# Patient Record
Sex: Male | Born: 1975
Health system: Southern US, Community
[De-identification: ages and names within clinical notes are randomized; demographics above are authoritative.]

## PROBLEM LIST (undated history)

## (undated) DIAGNOSIS — M549 Dorsalgia, unspecified: Secondary | ICD-10-CM

## (undated) DIAGNOSIS — F209 Schizophrenia, unspecified: Secondary | ICD-10-CM

## (undated) DIAGNOSIS — F319 Bipolar disorder, unspecified: Secondary | ICD-10-CM

## (undated) DIAGNOSIS — I10 Essential (primary) hypertension: Secondary | ICD-10-CM

## (undated) DIAGNOSIS — G8929 Other chronic pain: Secondary | ICD-10-CM

## (undated) HISTORY — DX: Essential (primary) hypertension: I10

## (undated) HISTORY — DX: Bipolar disorder, unspecified: F31.9

## (undated) HISTORY — DX: Other chronic pain: G89.29

## (undated) HISTORY — DX: Dorsalgia, unspecified: M54.9

---

## 2001-03-12 ENCOUNTER — Emergency Department (HOSPITAL_COMMUNITY): Admission: EM | Admit: 2001-03-12 | Discharge: 2001-03-12 | Payer: Self-pay | Admitting: *Deleted

## 2003-03-31 ENCOUNTER — Emergency Department (HOSPITAL_COMMUNITY): Admission: AD | Admit: 2003-03-31 | Discharge: 2003-03-31 | Payer: Self-pay | Admitting: Emergency Medicine

## 2005-05-04 ENCOUNTER — Emergency Department (HOSPITAL_COMMUNITY): Admission: EM | Admit: 2005-05-04 | Discharge: 2005-05-04 | Payer: Self-pay | Admitting: Family Medicine

## 2015-03-19 ENCOUNTER — Encounter (HOSPITAL_COMMUNITY): Payer: Self-pay

## 2015-03-19 ENCOUNTER — Emergency Department (HOSPITAL_COMMUNITY)
Admission: EM | Admit: 2015-03-19 | Discharge: 2015-03-20 | Disposition: A | Discharge status: Home / Self Care | Payer: Self-pay | Attending: Emergency Medicine | Admitting: Emergency Medicine

## 2015-03-19 DIAGNOSIS — Y998 Other external cause status: Secondary | ICD-10-CM | POA: Insufficient documentation

## 2015-03-19 DIAGNOSIS — Z23 Encounter for immunization: Secondary | ICD-10-CM | POA: Insufficient documentation

## 2015-03-19 DIAGNOSIS — Z720 Tobacco use: Secondary | ICD-10-CM | POA: Insufficient documentation

## 2015-03-19 DIAGNOSIS — T07XXXA Unspecified multiple injuries, initial encounter: Secondary | ICD-10-CM

## 2015-03-19 DIAGNOSIS — S0083XA Contusion of other part of head, initial encounter: Secondary | ICD-10-CM | POA: Insufficient documentation

## 2015-03-19 DIAGNOSIS — Y9289 Other specified places as the place of occurrence of the external cause: Secondary | ICD-10-CM | POA: Insufficient documentation

## 2015-03-19 DIAGNOSIS — S8992XA Unspecified injury of left lower leg, initial encounter: Secondary | ICD-10-CM | POA: Insufficient documentation

## 2015-03-19 DIAGNOSIS — Y9389 Activity, other specified: Secondary | ICD-10-CM | POA: Insufficient documentation

## 2015-03-19 DIAGNOSIS — Z8659 Personal history of other mental and behavioral disorders: Secondary | ICD-10-CM | POA: Insufficient documentation

## 2015-03-19 DIAGNOSIS — S60512A Abrasion of left hand, initial encounter: Secondary | ICD-10-CM | POA: Insufficient documentation

## 2015-03-19 HISTORY — DX: Schizophrenia, unspecified: F20.9
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
## 2015-03-20 MED ORDER — ACETAMINOPHEN 325 MG PO TABS
650.0000 mg | ORAL_TABLET | Freq: Once | ORAL | Status: AC
  Administered 2015-03-20: 650 mg via ORAL
  Filled 2015-03-20: qty 2

## 2015-03-20 MED ORDER — IBUPROFEN 800 MG PO TABS: 800.0000 mg | ORAL_TABLET | Freq: Three times a day (TID) | ORAL | Status: DC

## 2015-03-20 MED ORDER — BACITRACIN ZINC 500 UNIT/GM EX OINT
TOPICAL_OINTMENT | CUTANEOUS | Status: AC
  Administered 2015-03-20: 1
  Filled 2015-03-20: qty 0.9

## 2015-03-20 MED ORDER — TETANUS-DIPHTH-ACELL PERTUSSIS 5-2.5-18.5 LF-MCG/0.5 IM SUSP
0.5000 mL | Freq: Once | INTRAMUSCULAR | Status: AC
  Administered 2015-03-20: 0.5 mL via INTRAMUSCULAR
  Filled 2015-03-20: qty 0.5

## 2015-03-20 NOTE — ED Notes (Signed)
Pt L hand cleaned w/ NS, bacitracin placed on wound, wrapped w/ gauze.
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
## 2015-12-28 DIAGNOSIS — I10 Essential (primary) hypertension: Secondary | ICD-10-CM

## 2015-12-28 HISTORY — DX: Essential (primary) hypertension: I10

## 2015-12-31 ENCOUNTER — Encounter (HOSPITAL_COMMUNITY): Payer: Self-pay | Admitting: Emergency Medicine

## 2015-12-31 ENCOUNTER — Emergency Department (INDEPENDENT_AMBULATORY_CARE_PROVIDER_SITE_OTHER): Payer: Self-pay

## 2015-12-31 ENCOUNTER — Emergency Department (INDEPENDENT_AMBULATORY_CARE_PROVIDER_SITE_OTHER)
Admission: EM | Admit: 2015-12-31 | Discharge: 2015-12-31 | Disposition: A | Discharge status: Home / Self Care | Payer: Self-pay | Source: Home / Self Care | Attending: Family Medicine | Admitting: Family Medicine

## 2015-12-31 DIAGNOSIS — M542 Cervicalgia: Secondary | ICD-10-CM
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
## 2017-11-22 ENCOUNTER — Ambulatory Visit: Payer: Self-pay

## 2017-11-22 ENCOUNTER — Other Ambulatory Visit: Payer: Self-pay | Admitting: Occupational Medicine

## 2017-11-22 DIAGNOSIS — M545 Low back pain: Principal | ICD-10-CM

## 2017-11-22 DIAGNOSIS — G8929 Other chronic pain: Secondary | ICD-10-CM

## 2018-10-27 HISTORY — PX: NO PAST SURGERIES: SHX2092

## 2018-11-02 ENCOUNTER — Encounter: Payer: Self-pay | Admitting: Medical

## 2018-11-02 ENCOUNTER — Ambulatory Visit: Payer: BLUE CROSS/BLUE SHIELD | Admitting: Medical

## 2018-11-02 VITALS — BP 170/100 | HR 107 | Temp 98.3°F | Resp 16 | Ht 72.0 in | Wt 208.6 lb

## 2018-11-02 DIAGNOSIS — R609 Edema, unspecified: Secondary | ICD-10-CM | POA: Insufficient documentation

## 2018-11-02 DIAGNOSIS — R319 Hematuria, unspecified: Secondary | ICD-10-CM

## 2018-11-02 DIAGNOSIS — F319 Bipolar disorder, unspecified: Secondary | ICD-10-CM | POA: Diagnosis not present

## 2018-11-02 DIAGNOSIS — F17200 Nicotine dependence, unspecified, uncomplicated: Secondary | ICD-10-CM | POA: Diagnosis not present

## 2018-11-02 DIAGNOSIS — I10 Essential (primary) hypertension: Secondary | ICD-10-CM

## 2018-11-02 LAB — POCT URINALYSIS DIP (PROADVANTAGE DEVICE)
Bilirubin, UA: NEGATIVE
Glucose, UA: >=1000 mg/dL — AB
Ketones, POC UA: NEGATIVE mg/dL
Leukocytes, UA: NEGATIVE
Nitrite, UA: NEGATIVE
Protein Ur, POC: 100 mg/dL — AB
Specific Gravity, Urine: 1.015
Urobilinogen, Ur: NEGATIVE
pH, UA: 6 (ref 5.0–8.0)

## 2018-11-02 MED ORDER — AZILSARTAN-CHLORTHALIDONE 40-12.5 MG PO TABS
1.0000 | ORAL_TABLET | Freq: Every day | ORAL | 1 refills | Status: DC
Start: 2018-11-02 — End: 2020-01-23

## 2018-11-02 NOTE — Patient Instructions (Addendum)
It was nice to meet you today  You have uncontrolled high blood pressure, and unfortunately your EKG heart screen looks as if you may have had some damage to the heart   I would like to refer you to a heart doctor soon for baseline evaluation  Recommendation  I would like to begin you on a medication called Edarbychlor 40/12.5 mg 1 tablet daily in the morning to control high blood pressure  Cut back on salt intake such as soda and do not add salt to your food  Try to change your eating habits to eat more fruits and vegetables and less fast food and eating out  And though stopping smoking is hard, you need to cut back and try to stop tobacco  Do some walking daily for exercise  I would like to see you back in a month, but in the meantime we will have you see the cardiologist     Hypertension, commonly called high blood pressure, is when the force of blood pumping through your arteries is too strong. Your arteries are the blood vessels that carry blood from your heart throughout your body. A blood pressure reading consists of a higher number over a lower number, such as 110/72. The higher number (systolic) is the pressure inside your arteries when your heart pumps. The lower number (diastolic) is the pressure inside your arteries when your heart relaxes. Ideally you want your blood pressure below 120/80. Hypertension forces your heart to work harder to pump blood. Your arteries may become narrow or stiff. Having hypertension puts you at risk for heart disease, stroke, and other problems.  RISK FACTORS Some risk factors for high blood pressure are controllable. Others are not.  Risk factors you cannot control include:   Race. You may be at higher risk if you are African American.  Age. Risk increases with age.  Gender. Men are at higher risk than women before age 64 years. After age 90, women are at higher risk than men. Risk factors you can control include:  Not getting enough  exercise or physical activity.  Being overweight.  Getting too much fat, sugar, calories, or salt in your diet.  Drinking too much alcohol. SIGNS AND SYMPTOMS Hypertension does not usually cause signs or symptoms. Extremely high blood pressure (hypertensive crisis) may cause headache, anxiety, shortness of breath, and nosebleed. DIAGNOSIS  To check if you have hypertension, your health care provider will measure your blood pressure while you are seated, with your arm held at the level of your heart. It should be measured at least twice using the same arm. Certain conditions can cause a difference in blood pressure between your right and left arms. A blood pressure reading that is higher than normal on one occasion does not mean that you need treatment. If one blood pressure reading is high, ask your health care provider about having it checked again. BLOOD PRESSURE STAGES Blood pressure is classified into four stages: normal, prehypertension, stage 1, and stage 2. Your blood pressure reading will be used to determine what type of treatment, if any, is necessary. Appropriate treatment options are tied to these four stages:  Normal  Systolic pressure (mm Hg): below 120.  Diastolic pressure (mm Hg): below 80. Prehypertension  Systolic pressure (mm Hg): 120 to 139.  Diastolic pressure (mm Hg): 80 to 89. Stage1  Systolic pressure (mm Hg): 140 to 159.  Diastolic pressure (mm Hg): 90 to 99. Stage2  Systolic pressure (mm Hg): 160 or above.  Diastolic pressure (mm Hg): 100 or above. RISKS RELATED TO HIGH BLOOD PRESSURE Managing your blood pressure is an important responsibility. Uncontrolled high blood pressure can lead to:  A heart attack.  A stroke.  A weakened blood vessel (aneurysm).  Heart failure.  Kidney damage.  Eye damage.  Metabolic syndrome.  Memory and concentration problems. TREATMENT  Treating high blood pressure includes making lifestyle changes and  possibly taking medicine. Living a healthy lifestyle can help lower high blood pressure. You may need to change some of your habits. Lifestyle changes may include:  Following the DASH diet. This diet is high in fruits, vegetables, and whole grains. It is low in salt, red meat, and added sugars.  Getting at least 2 hours of brisk physical activity every week.  Losing weight if necessary.  Not smoking.  Limiting alcoholic beverages.  Learning ways to reduce stress. If lifestyle changes are not enough to get your blood pressure under control, your health care provider may prescribe medicine. You may need to take more than one. Work closely with your health care provider to understand the risks and benefits. HOME CARE INSTRUCTIONS  Have your blood pressure rechecked as directed by your health care provider.   Take medicines only as directed by your health care provider. Follow the directions carefully. Blood pressure medicines must be taken as prescribed. The medicine does not work as well when you skip doses. Skipping doses also puts you at risk for problems.   Do not smoke.   Monitor your blood pressure at home as directed by your health care provider. SEEK MEDICAL CARE IF:   You think you are having a reaction to medicines taken.  You have recurrent headaches or feel dizzy.  You have swelling in your ankles.  You have trouble with your vision. SEEK IMMEDIATE MEDICAL CARE IF:  You develop a severe headache or confusion.  You have unusual weakness, numbness, or feel faint.  You have severe chest or abdominal pain.  You vomit repeatedly.  You have trouble breathing. MAKE SURE YOU:   Understand these instructions.  Will watch your condition.  Will get help right away if you are not doing well or get worse. Document Released: 12/13/2005 Document Revised: 04/29/2014 Document Reviewed: 10/05/2013 [facility] Patient Information 2015 [place], [place]. This information is  not intended to replace advice given to you by your health care provider. Make sure you discuss any questions you have with your health care provider.

## 2018-11-02 NOTE — Progress Notes (Signed)
Subjective:  Rodney Rose is a 42 y.o. male who presents for Chief Complaint  Patient presents with  . NP    NP elevated BP X 1-2 year     Medical team: Dr. Georgia Lopes at [facility] Psychiatry x 10 years  Here is a new patient.   Here for consult about high blood pressure.  Sees psychiatry, and BP is checked there.  He knows he has had untreated high blood pressure x 2 years.   No chest pains.  No palpitations, no SOB, but does get some mild swelling in legs.  He is a smoker, eats unhealthy, fast food, soda, salt regularly.   Uses NSAIDs some.   exercise is limited.  On workers comp coverage currently, has back pain.    No other aggravating or relieving factors.    No other c/o.  The following portions of the patient's history were reviewed and updated as appropriate: allergies, current medications, past family history, past medical history, past social history, past surgical history and problem list.  ROS Otherwise as in subjective above  Past Medical History:  Diagnosis Date  . Bipolar disorder (HCC)    Dr. Georgia Lopes with [facility] Psychiatry  . Chronic back pain   . Hypertension 2017  . Schizophrenia (HCC)    Current Outpatient Medications on File Prior to Visit  Medication Sig Dispense Refill  . haloperidol (HALDOL) 5 MG tablet Take 5 mg by mouth at bedtime.    [name] ibuprofen (ADVIL,MOTRIN) 800 MG tablet Take 1 tablet (800 mg total) by mouth 3 (three) times daily. 21 tablet 0   No current facility-administered medications on file prior to visit.      Objective: BP (!) 170/100   Pulse (!) 107   Temp 98.3 F (36.8 C) (Oral)   Resp 16   Ht 6' (1.829 m)   Wt 208 lb 9.6 oz (94.6 kg)   SpO2 98%   BMI 28.29 kg/m   General appearance: alert, no distress, well developed, well nourished Neck: supple, no lymphadenopathy, no thyromegaly, no masses, no bruits Heart: RRR, normal S1, S2, no murmurs Lungs: CTA bilaterally, no wheezes, rhonchi, or rales Abdomen: +bs, soft, non tender,  non distended, no masses, no hepatomegaly, no splenomegaly, no bruits Pulses: 2+ radial pulses, 2+ pedal pulses, normal cap refill Ext: no edema   Adult ECG Report  Indication: HTN  Rate: 101 bpm  Rhythm: sinus tachycardia  QRS Axis: -19 degrees  PR Interval:  QRS Duration: 94ms  QTc:  Conduction Disturbances: none  Other Abnormalities: ST elevation v2, likely v3  Patient's cardiac risk factors are: hypertension, male gender and smoking/ tobacco exposure.  EKG comparison: none  Narrative Interpretation: abnormal EKG    Assessment: Encounter Diagnoses  Name Primary?  . Essential hypertension, benign Yes  . Smoker   . Bipolar affective disorder, remission status unspecified (HCC)   . Edema, unspecified type      Plan: We discussed his blood pressure and abnormal EKG today.  Begin samples of Edarbychlor, referral to cardiology, counseled on diet, exercise, medication and follow-up.  Discussed symptoms that would prompt a Call 911 if he started having cardiac symptoms  Reviewed the following with patient: It was nice to meet you today  You have uncontrolled high blood pressure, and unfortunately your EKG heart screen looks as if you may have had some damage to the heart   I would like to refer you to a heart doctor soon for baseline evaluation  Recommendation  I would like to begin you on a medication called Edarbychlor 40/12.5 mg 1 tablet daily in the morning to control high blood pressure  Cut back on salt intake such as soda and do not add salt to your food  Try to change your eating habits to eat more fruits and vegetables and less fast food and eating out  And though stopping smoking is hard, you need to cut back and try to stop tobacco  Do some walking daily for exercise  I would like to see you back in a month, but in the meantime we will have you see the cardiologist  [name] was seen today for np.  Diagnoses and all orders for this  visit:  Essential hypertension, benign -     EKG 12-Lead -     Comprehensive metabolic panel -     CBC -     Lipid panel -     TSH -     Ambulatory referral to Cardiology  Smoker  Bipolar affective disorder, remission status unspecified (HCC)  Edema, unspecified type -     Comprehensive metabolic panel  Other orders -     Azilsartan-Chlorthalidone 40-12.5 MG TABS; Take 1 tablet by mouth daily.    Follow up: pending labs

## 2018-11-03 ENCOUNTER — Ambulatory Visit: Payer: BLUE CROSS/BLUE SHIELD | Admitting: Medical

## 2018-11-03 VITALS — BP 180/110 | HR 110 | Temp 98.1°F | Resp 16 | Ht 72.0 in | Wt 208.0 lb

## 2018-11-03 DIAGNOSIS — E119 Type 2 diabetes mellitus without complications: Secondary | ICD-10-CM

## 2018-11-03 DIAGNOSIS — E1365 Other specified diabetes mellitus with hyperglycemia: Secondary | ICD-10-CM | POA: Diagnosis not present

## 2018-11-03 DIAGNOSIS — E782 Mixed hyperlipidemia: Secondary | ICD-10-CM

## 2018-11-03 DIAGNOSIS — F17200 Nicotine dependence, unspecified, uncomplicated: Secondary | ICD-10-CM

## 2018-11-03 DIAGNOSIS — R739 Hyperglycemia, unspecified: Secondary | ICD-10-CM | POA: Diagnosis not present

## 2018-11-03 DIAGNOSIS — R9431 Abnormal electrocardiogram [ECG] [EKG]: Secondary | ICD-10-CM

## 2018-11-03 DIAGNOSIS — I10 Essential (primary) hypertension: Secondary | ICD-10-CM | POA: Diagnosis not present

## 2018-11-03 DIAGNOSIS — IMO0002 Reserved for concepts with insufficient information to code with codable children: Secondary | ICD-10-CM | POA: Insufficient documentation

## 2018-11-03 DIAGNOSIS — E1165 Type 2 diabetes mellitus with hyperglycemia: Secondary | ICD-10-CM | POA: Insufficient documentation

## 2018-11-03 LAB — TSH: TSH: 0.752 u[IU]/mL (ref 0.450–4.500)

## 2018-11-03 LAB — LIPID PANEL
Chol/HDL Ratio: 8.8 ratio — ABNORMAL HIGH (ref 0.0–5.0)
Cholesterol, Total: 245 mg/dL — ABNORMAL HIGH (ref 100–199)
HDL: 28 mg/dL — ABNORMAL LOW (ref >39)
LDL Calculated: 155 mg/dL — ABNORMAL HIGH (ref 0–99)
Triglycerides: 310 mg/dL — ABNORMAL HIGH (ref 0–149)
VLDL Cholesterol Cal: 62 mg/dL — ABNORMAL HIGH (ref 5–40)

## 2018-11-03 LAB — COMPREHENSIVE METABOLIC PANEL
ALT: 14 IU/L (ref 0–44)
AST: 13 IU/L (ref 0–40)
Albumin/Globulin Ratio: 1.7 (ref 1.2–2.2)
Albumin: 3.5 g/dL (ref 3.5–5.5)
Alkaline Phosphatase: 86 IU/L (ref 39–117)
BUN/Creatinine Ratio: 7 — ABNORMAL LOW (ref 9–20)
BUN: 8 mg/dL (ref 6–24)
Bilirubin Total: 0.3 mg/dL (ref 0.0–1.2)
CO2: 25 mmol/L (ref 20–29)
Calcium: 8.8 mg/dL (ref 8.7–10.2)
Chloride: 97 mmol/L (ref 96–106)
Creatinine, Ser: 1.22 mg/dL (ref 0.76–1.27)
GFR calc Af Amer: 85 mL/min/{1.73_m2} (ref >59)
GFR calc non Af Amer: 74 mL/min/{1.73_m2} (ref >59)
Globulin, Total: 2.1 g/dL (ref 1.5–4.5)
Glucose: 485 mg/dL — ABNORMAL HIGH (ref 65–99)
Potassium: 3.3 mmol/L — ABNORMAL LOW (ref 3.5–5.2)
Sodium: 137 mmol/L (ref 134–144)
Total Protein: 5.6 g/dL — ABNORMAL LOW (ref 6.0–8.5)

## 2018-11-03 LAB — CBC
Hematocrit: 45.8 % (ref 37.5–51.0)
Hemoglobin: 14.7 g/dL (ref 13.0–17.7)
MCH: 25.5 pg — ABNORMAL LOW (ref 26.6–33.0)
MCHC: 32.1 g/dL (ref 31.5–35.7)
MCV: 80 fL (ref 79–97)
Platelets: 226 10*3/uL (ref 150–450)
RBC: 5.76 x10E6/uL (ref 4.14–5.80)
RDW: 12.5 % (ref 12.3–15.4)
WBC: 9.3 10*3/uL (ref 3.4–10.8)

## 2018-11-03 LAB — POCT CBG (FASTING - GLUCOSE)-MANUAL ENTRY: Glucose Fasting, POC: 336 mg/dL — AB (ref 70–99)

## 2018-11-03 LAB — POCT GLYCOSYLATED HEMOGLOBIN (HGB A1C): Hemoglobin A1C: 11.1 % — AB (ref 4.0–5.6)

## 2018-11-03 MED ORDER — INSULIN PEN NEEDLE 32G X 4 MM MISC: 1.0000 | Freq: Every day | 11 refills | Status: DC

## 2018-11-03 MED ORDER — INSULIN GLULISINE 100 UNIT/ML SOLOSTAR PEN: 10.0000 [IU] | PEN_INJECTOR | Freq: Three times a day (TID) | SUBCUTANEOUS | 2 refills | Status: DC

## 2018-11-03 NOTE — Patient Instructions (Addendum)
I hate to give you bad news 2 days in a row here, but sometimes we just need to address what we have at hand  Your current findings yesterday and today show uncontrolled high blood pressure, new diagnosis of uncontrolled diabetes, high cholesterol, and abnormal EKG heart marker  Per yesterday's discussion we are going to refer you to a heart doctor for further evaluation  Continue the samples of Edarbychlor blood pressure pill once daily in the morning we started yesterday   Regarding this new diagnosis of diabetes, I need to start you on mealtime insulin given the fact that the blood sugars are so high currently  Let us begin Apidra mealtime insulin with a sliding scale below.  Take this insulin less than 15 minutes before or less than 20 minutes after a meal.  I do need you to check your blood sugars before meals and write them down in the logbook we gave you today so we can keep track of your blood sugars for right now  I put diet recommendations below at the bottom    Correction Insulin/Sliding Scale Your caregiver has decided you need insulin at home. You have been given a correctional scale (sliding scale) in case you need extra insulin when your blood sugar is too high (hyperglycemia). The following instructions will assist you in how to use that correctional scale.  WHAT IS A CORRECTIONAL SCALE (SLIDING SCALE)?  When you check your blood sugar, sometimes it will be higher than your caregiver wants it to be. You may need an extra dose of insulin to bring your blood sugar to your desired level (also known as your goal, target level, or normal level.) The correctional scale is prescribed by your caregiver based on your specific needs.   ______________________________________________________________________  INSULIN SLIDING SCALE   Use the chart below to determine the amount of your APIDRA Insulin that you will use to control your meal time blood sugar.  If your glucose before  meal is less than 60, drink 4 oz of orange juice or if able, eat a piece of candy and do not use the meal time dose of insulin  If your glucose before meal is 60 -100, don't use the meal time insulin for this meal If your glucose before meal is 101-150, use  3  units of Insulin  If your glucose before meal is 151-200, use  5  units of Insulin  If your glucose before meal is 201-250, use  7  units of Insulin If your glucose before meal is 251-300, use  9  units of Insulin If your glucose before meal is 301-350, use  11  units of Insulin If your glucose before meal is 351-400, use  13  units of Insulin If your glucose before meal is 451-500, use  15  units of Insulin If your glucose before meal is >500, use 17 units of Insulin and call doctor immediately  ________________________________________________________________________    WHY IS IT IMPORTANT TO KEEP YOUR BLOOD SUGAR LEVELS AT YOUR DESIRED LEVEL?  It helps to prevent long-term complications of diabetes, such as eye disease, kidney failure, and other serious complications. WHAT TYPE OF INSULIN WILL YOU USE?  To help bring down blood sugars that are too high, your caregiver has prescribed a short-acting or a rapid-acting insulin. An example of a short-acting insulin would be Regular.  WHAT DO I NEED TO DO?   Check your blood sugar with your home blood glucose meter as recommended by your  caregiver.  Using your correctional scale, find the range your blood sugar lies in.  Look for the units of insulin that matches the blood sugar range. Give yourself the dose of correctional insulin your caregiver has prescribed. Always make sure you are using the right type of insulin.  Prior to the injection make sure you have food available that you can eat in the next 15 to 30 minutes.  If your correctional insulin is rapid acting, start eating your meal within 15 minutes after you have given yourself the insulin injection. If you wait  longer than 15 minutes to eat, your blood sugar might get too low.  If your correctional insulin is short acting (Regular), start eating your meal within 30 minutes after you have given yourself the insulin injection. If you wait longer than 30 minutes to eat, your blood sugar might get too low. Symptoms of low blood sugar (hypoglycemia) may include feeling shaky or weak, sweating a lot, not thinking straight, difficulty seeing, agitation, or crankiness. Check your blood sugar immediately and treat your results as directed by your caregiver.  Keep a log of your blood sugar results with the time you took the test and the amount of insulin that you injected. This information will help your caregiver manage your medications.  Note on your log anything that may affect your blood sugars such as:  Changes in normal exercise or activity.  Changes in your normal schedule, such as staying up late, going on vacation, changing your diet, or holidays.  New medications. This includes all medications. Some medications, even those that do not require a prescription, may cause high blood sugars.  Illness or stress.  Changes in when you actually took your medication.  Changes in your meals, such as skipping a meal, a late meal, or dining out.  Eating things that may affect blood glucose, such as snacks, larger meal portions than normal, or drinks with sugar.  Ask your caregiver any questions you have.      I want you to eat 3 meals a day +2 snacks, one midmorning snack and one mid afternoon snack  Breakfast You may eat 1 of the following  Omelette, which can include a small amount of cheese, and vegetables such as peppers, mushrooms, small pieces of [place] or chicken  Low sugar yogurt serving which can include some fruit such as berries  Egg whites or hard boiled egg and meat (1-2 strips of bacon, or small piece of [place] sausage or [place] bacon)   Mid-morning snack 1 fruit serving such as  one of the following:  medium-sized apple  medium-sized orange,  Tangerine  1/2 banana   3/4 cup of fresh berries or frozen berries  A protein source such as one of the following:  8 almonds   small handful of walnuts or other nuts  small piece of cheese,  low sugar yogurt   Lunch A protein source such as 1 of the following: . 1 serving of beans such as black beans, pinto beans, green beans, or edamame (soy beans) . 1 meat serving such as 6 oz or deck of card size serving of fish, skinless chicken, or [place], either grilled or baked preferably.   You can use some pork or beef, but limit this compared to fish, chicken or [place] Vegetable - Half of your plate should be a non-starchy vegetables!  So avoid white potatoes and corn.  Otherwise, eat a large portion of vegetables. . Avocado, cucumber, tomato, carrots, greens,  lettuce, squash, okra, etc.  . Vegetables can include salad with olive oil/vinaigrette dressing   Mid-afternoon snack 1 fruit serving such as one of the following:  medium-sized apple  medium-sized orange,  Tangerine  1/2 banana   3/4 cup of fresh berries or frozen berries  A protein source such as one of the following:  8 almonds   small handful of walnuts or other nuts  small piece of cheese,  low sugar yogurt   Dinner A protein source such as 1 of the following: . 1 serving of beans such as black beans, pinto beans, green beans, or edamame (soy beans) . 1 meat serving such as 6 oz or deck of card size serving of fish, skinless chicken, or [place], either grilled or baked preferably.   You can use some pork or beef, but limit this compared to fish, chicken or [place] Vegetable - Half of your plate should be a non-starchy vegetables!  So avoid white potatoes and corn.  Otherwise, eat a large portion of vegetables. . Avocado, cucumber, tomato, carrots, greens, lettuce, squash, okra, etc.  . Vegetables can include salad with olive  oil/vinaigrette dressing   Beverages: Water Unsweet tea Home made juice with a juicer without sugar added other than small bit of honey or agave nectar Water with sugar free flavor such as [place]   AVOID.... For the time being I want you to cut out the following items completely: . Soda, sweet tea, juice, beer or wine or alcohol . ALL grains and breads including rice, pasta, bread, cereal . Sweets such as cake, candy, pies, chips, cookies, chocolate

## 2018-11-03 NOTE — Progress Notes (Signed)
Subjective:  Rodney Rose is a 42 y.o. male who presents for Chief Complaint  Patient presents with  . follow up    follow up Sugar high     Here for 1 day follow-up.  I saw him yesterday as a new patient for high blood pressure, but he had sugar in the urine yesterday.  His blood sugar came back over 400 so we called him to get him back in again today.  He has no history of diabetes.  Medical team: Dr. Georgia Rose at [facility] Psychiatry x 10 years  He denies polyuria, polydipsia, weight changes recently or blurred vision  No other aggravating or relieving factors.    No other c/o.  The following portions of the patient's history were reviewed and updated as appropriate: allergies, current medications, past family history, past medical history, past social history, past surgical history and problem list.  ROS Otherwise as in subjective above  Past Medical History:  Diagnosis Date  . Bipolar disorder (HCC)    Dr. Georgia Rose with [facility] Psychiatry  . Chronic back pain   . Hypertension 2017  . Schizophrenia [facility])    Current Outpatient Medications on File Prior to Visit  Medication Sig Dispense Refill  . Azilsartan-Chlorthalidone 40-12.5 MG TABS Take 1 tablet by mouth daily. 30 tablet 1  . haloperidol (HALDOL) 5 MG tablet Take 5 mg by mouth at bedtime.    [name] ibuprofen (ADVIL,MOTRIN) 800 MG tablet Take 1 tablet (800 mg total) by mouth 3 (three) times daily. 21 tablet 0   No current facility-administered medications on file prior to visit.      Objective: BP (!) 180/110   Pulse (!) 110   Temp 98.1 F (36.7 C) (Oral)   Resp 16   Ht 6' (1.829 m)   Wt 208 lb (94.3 kg)   SpO2 98%   BMI 28.21 kg/m   General appearance: alert, no distress, well developed, well nourished Neck: supple, no lymphadenopathy, no thyromegaly, no masses, no bruits Heart: RRR, normal S1, S2, no murmurs Lungs: CTA bilaterally, no wheezes, rhonchi, or rales Abdomen: +bs, soft, non tender, non distended,  no masses, no hepatomegaly, no splenomegaly, no bruits Pulses: 2+ radial pulses, 2+ pedal pulses, normal cap refill Ext: no edema    Assessment: Encounter Diagnoses  Name Primary?  [name] Uncontrolled other specified diabetes mellitus with hyperglycemia (HCC)   . Elevated blood sugar Yes  . Diabetes mellitus, new onset (HCC)   . Essential hypertension, benign   . Mixed dyslipidemia   . Abnormal EKG   . Smoker      Plan: I reviewed his lab results with him from yesterday, we did fingerstick blood sugar and hemoglobin A1c today. Hemoglobin A1c over 11%, blood sugar random was 336, last meal about 2 hours ago.  I spent over 40 minutes discussing new diagnosis of diabetes, the severity of his current blood sugar levels and possible complications, discussed treatment, short-term goals.  I had nurse come in and review glucometer testing, logbook, how to use insulin, and we had him do this first 8 units in the office.  We discussed sliding scale that I want him to use and check sugars before meals.  We had a sample of Apidra today and I did want to risk him going to the weekend without mealtime insulin so we will start with this.  Give the following recommendations in writing:  Your current findings yesterday and today show uncontrolled high blood pressure, new diagnosis of uncontrolled diabetes,  high cholesterol, and abnormal EKG heart marker  Per yesterday's discussion we are going to refer you to a heart doctor for further evaluation  Continue the samples of Edarbychlor blood pressure pill once daily in the morning we started yesterday   Regarding this new diagnosis of diabetes, I need to start you on mealtime insulin given the fact that the blood sugars are so high currently  Let us begin Apidra mealtime insulin with a sliding scale below.  Take this insulin less than 15 minutes before or less than 20 minutes after a meal.  I do need you to check your blood sugars before meals and  write them down in the logbook we gave you today so we can keep track of your blood sugars for right now  I put diet recommendations below at the bottom    Correction Insulin/Sliding Scale Your caregiver has decided you need insulin at home. You have been given a correctional scale (sliding scale) in case you need extra insulin when your blood sugar is too high (hyperglycemia). The following instructions will assist you in how to use that correctional scale.  WHAT IS A CORRECTIONAL SCALE (SLIDING SCALE)?  When you check your blood sugar, sometimes it will be higher than your caregiver wants it to be. You may need an extra dose of insulin to bring your blood sugar to your desired level (also known as your goal, target level, or normal level.) The correctional scale is prescribed by your caregiver based on your specific needs.   ______________________________________________________________________  INSULIN SLIDING SCALE   Use the chart below to determine the amount of your APIDRA Insulin that you will use to control your meal time blood sugar.  If your glucose before meal is less than 60, drink 4 oz of orange juice or if able, eat a piece of candy and do not use the meal time dose of insulin  If your glucose before meal is 60 -100, don't use the meal time insulin for this meal If your glucose before meal is 101-150, use  3  units of Insulin  If your glucose before meal is 151-200, use  5  units of Insulin  If your glucose before meal is 201-250, use  7  units of Insulin If your glucose before meal is 251-300, use  9  units of Insulin If your glucose before meal is 301-350, use  11  units of Insulin If your glucose before meal is 351-400, use  13  units of Insulin If your glucose before meal is 451-500, use  15  units of Insulin If your glucose before meal is >500, use 17 units of Insulin and call doctor immediately  Rodney Rose was seen today for follow up.  Diagnoses and all orders  for this visit:  Elevated blood sugar -     Glucose (CBG), Fasting -     HgB A1c  Uncontrolled other specified diabetes mellitus with hyperglycemia (HCC)  Diabetes mellitus, new onset (HCC)  Essential hypertension, benign  Mixed dyslipidemia  Abnormal EKG  Smoker  Other orders -     Insulin Glulisine (APIDRA SOLOSTAR) 100 UNIT/ML Solostar Pen; Inject 10 Units into the skin 3 (three) times daily. -     Insulin Pen Needle (BD PEN NEEDLE NANO U/F) 32G X 4 MM MISC; 1 each by Does not apply route at bedtime.    Follow up: 1-2 weeks

## 2018-11-06 DIAGNOSIS — F17200 Nicotine dependence, unspecified, uncomplicated: Secondary | ICD-10-CM | POA: Diagnosis not present

## 2018-11-06 DIAGNOSIS — I10 Essential (primary) hypertension: Secondary | ICD-10-CM | POA: Diagnosis not present

## 2018-11-06 DIAGNOSIS — R9431 Abnormal electrocardiogram [ECG] [EKG]: Secondary | ICD-10-CM | POA: Diagnosis not present

## 2018-11-06 DIAGNOSIS — E782 Mixed hyperlipidemia: Secondary | ICD-10-CM | POA: Diagnosis not present

## 2018-11-14 DIAGNOSIS — I10 Essential (primary) hypertension: Secondary | ICD-10-CM | POA: Diagnosis not present

## 2018-11-14 DIAGNOSIS — R9431 Abnormal electrocardiogram [ECG] [EKG]: Secondary | ICD-10-CM | POA: Diagnosis not present

## 2018-11-15 ENCOUNTER — Ambulatory Visit (HOSPITAL_COMMUNITY)
Admission: RE | Admit: 2018-11-15 | Discharge: 2018-11-15 | Disposition: A | Discharge status: Home / Self Care | Payer: BLUE CROSS/BLUE SHIELD | Source: Ambulatory Visit | Attending: Medical | Admitting: Medical

## 2018-11-15 ENCOUNTER — Ambulatory Visit: Payer: BLUE CROSS/BLUE SHIELD | Admitting: Medical

## 2018-11-15 ENCOUNTER — Encounter: Payer: Self-pay | Admitting: Medical

## 2018-11-15 ENCOUNTER — Other Ambulatory Visit: Payer: Self-pay | Admitting: Medical

## 2018-11-15 ENCOUNTER — Telehealth: Payer: Self-pay | Admitting: Medical

## 2018-11-15 VITALS — BP 142/90 | HR 116 | Temp 98.3°F | Wt 201.8 lb

## 2018-11-15 DIAGNOSIS — M7989 Other specified soft tissue disorders: Secondary | ICD-10-CM

## 2018-11-15 DIAGNOSIS — M79604 Pain in right leg: Secondary | ICD-10-CM

## 2018-11-15 DIAGNOSIS — F17200 Nicotine dependence, unspecified, uncomplicated: Secondary | ICD-10-CM

## 2018-11-15 DIAGNOSIS — R000 Tachycardia, unspecified: Secondary | ICD-10-CM

## 2018-11-15 DIAGNOSIS — M25551 Pain in right hip: Secondary | ICD-10-CM | POA: Insufficient documentation

## 2018-11-15 DIAGNOSIS — E1365 Other specified diabetes mellitus with hyperglycemia: Secondary | ICD-10-CM

## 2018-11-15 LAB — BASIC METABOLIC PANEL
BUN/Creatinine Ratio: 15 (ref 9–20)
BUN: 21 mg/dL (ref 6–24)
CO2: 25 mmol/L (ref 20–29)
Calcium: 10.2 mg/dL (ref 8.7–10.2)
Chloride: 92 mmol/L — ABNORMAL LOW (ref 96–106)
Creatinine, Ser: 1.44 mg/dL — ABNORMAL HIGH (ref 0.76–1.27)
GFR calc Af Amer: 70 mL/min/{1.73_m2} (ref >59)
GFR calc non Af Amer: 60 mL/min/{1.73_m2} (ref >59)
Glucose: 426 mg/dL — ABNORMAL HIGH (ref 65–99)
Potassium: 4.7 mmol/L (ref 3.5–5.2)
Sodium: 132 mmol/L — ABNORMAL LOW (ref 134–144)

## 2018-11-15 MED ORDER — INSULIN PEN NEEDLE 32G X 4 MM MISC: 1.0000 | Freq: Every day | 11 refills | Status: DC

## 2018-11-15 MED ORDER — TRAMADOL HCL 50 MG PO TABS: 50.0000 mg | ORAL_TABLET | Freq: Four times a day (QID) | ORAL | 0 refills | Status: AC | PRN

## 2018-11-15 MED ORDER — INSULIN GLULISINE 100 UNIT/ML SOLOSTAR PEN: 8.0000 [IU] | PEN_INJECTOR | Freq: Three times a day (TID) | SUBCUTANEOUS | 2 refills | Status: DC

## 2018-11-15 MED ORDER — INSULIN GLARGINE (2 UNIT DIAL) 300 UNIT/ML ~~LOC~~ SOPN: 20.0000 [IU] | PEN_INJECTOR | Freq: Every day | SUBCUTANEOUS | 2 refills | Status: DC

## 2018-11-15 NOTE — Telephone Encounter (Signed)
Pt called and states that the RX Apidra solostar is going to be 600 and he is wondering if you could switch him to something else pt uses [facility] [place], [place] - [address] pt can be reached at [phone]

## 2018-11-15 NOTE — Telephone Encounter (Signed)
Pt advised. KH 

## 2018-11-15 NOTE — Telephone Encounter (Signed)
I saw under results tab that prelim ultrasound shows no clot thankfully.  I recommend he take Aspirin 81mg  daily for the time being for heart health and to see if this helps the leg pain.  He did have varicose veins which can get inflamed and cause some discomfort.  He also had hip pain and exam suggesting arthritis.     I want him to do some stretching daily, and if any worse leg pains in the next few days let me know.  Regarding hip pain, the next step would be xray of hip.  We can either pursue this now or wait to see if his symptoms improve over the next week.     Have him begin checking sugars once daily in the morning and get me sugar readings in 1 week.   See other message from lab results from earlier today.

## 2018-11-15 NOTE — Progress Notes (Signed)
Preliminary notes--Right lower extremity venous duplex exam completed. Negative for DVT. Result attempted to call ordering physician's office, no answers. Will e-fax result to ordering physician's office.  Raedyn Klinck H Sherilyn Windhorst(RDMS RVT) 11/15/18 1:49 PM

## 2018-11-15 NOTE — Progress Notes (Signed)
Subjective: Chief Complaint  Patient presents with  . leg pain    right leg pain for the last 3 weeks. from side all down leg.    Here for right leg pain.  Entire right leg hurts, sometimes getting some cramps in calve that aren't going away.    No swelling . Couldn't drive the other day due to the pain.  No back pain.   Denies injury, no trauma, no fall.   No fever.  He denies history of arthritis, no prior x-ray of his leg or hips.  He denies ongoing problems with the hip prior to now.  I just met him on November 7 as a new patient.  In the interim he is had uncontrolled high blood pressure which we started medicine for recently, we referred him to cardiology and he has had consult with them.  Last visit last week we also started him on insulin given blood sugars close to 500 uncontrolled, new diagnosis of diabetes.  He seems to be compliant with using Apidra 8 units at mealtimes 3 times a day.  However he is not checking his blood sugars for some reason, was confused on that detail.  He never went to the pharmacy to get a glucometer.  And his sample ran out today  After investigating a little further today, before I met him, he had a work injury around a month ago where he had a pulled muscle trying to grab hold of something.  But he denies hitting his leg or having any major trauma.  He did see Worker's Comp. doctor for this.  He is awaiting a callback from his employer about orthopedic referral.  He denies any leg pain initially from that work injury   Past Medical History:  Diagnosis Date  . Bipolar disorder ([place])    Dr. Heriberto Antigua with [facility] Psychiatry  . Chronic back pain   . Hypertension 2017  . Schizophrenia [facility])    Current Outpatient Medications on File Prior to Visit  Medication Sig Dispense Refill  . Azilsartan-Chlorthalidone 40-12.5 MG TABS Take 1 tablet by mouth daily. 30 tablet 1  . haloperidol (HALDOL) 5 MG tablet Take 5 mg by mouth at bedtime.    [name] ibuprofen (ADVIL,MOTRIN)  800 MG tablet Take 1 tablet (800 mg total) by mouth 3 (three) times daily. 21 tablet 0  . Insulin Pen Needle (BD PEN NEEDLE NANO U/F) 32G X 4 MM MISC 1 each by Does not apply route at bedtime. 100 each 11   No current facility-administered medications on file prior to visit.    ROS as in subjective   Objective: BP (!) 142/90   Pulse (!) 116   Temp 98.3 F (36.8 C) (Oral)   Wt 201 lb 12.8 oz (91.5 kg)   SpO2 98%   BMI 27.37 kg/m   General: Well-developed while nourished no acute distress Lungs clear Heart: Tachycardic, otherwise RRR, normal S1-S2 He seems to be generally tender throughout the calf on the right, there are varicose veins present, there is asymmetry of the right calf compared to the left, but he has reported pain seems out of proportion to the exam findings.  He does have some pain with right hip range of motion worse with internal range of motion which is reduced.  His left hip range of motion is normal left leg exam is unremarkable in general There is no frank edema but there is asymmetry of the right calf compared to the left. Positive Homans on the  right Otherwise legs neurovascularly intact No erythema, no warmth, no discoloration of right leg   Assessment: Encounter Diagnoses  Name Primary?  . Right leg pain Yes  . Right hip pain   . Lower extremity pain, diffuse, right   . Calf swelling   . Tachycardia   . Uncontrolled other specified diabetes mellitus with hyperglycemia ([place])   . Smoker      Plan: His right leg exam suggest possible arthritis of the right hip but more concerning is asymmetry of the right calf positive Homans and tachycardia today which could suggest blood clot in the leg  We will set up for STAT right leg ultrasound for blood clot evaluation  STAT basic metabolic lab today to check potassium and to check blood sugar.  We did start him on mealtime insulin last week due to new diabetes diagnosis and uncontrolled blood sugar.  He is  compliant with mealtime insulin 8 units per meal 3 times a day.  I again reiterated the need to check his blood sugars.  He has the prescription that I gave him last week for glucometer but was confused about getting this is a prescription  Follow-up pending labs and ultrasound  At[place] was seen today for leg pain.  Diagnoses and all orders for this visit:  Right leg pain -     Basic metabolic panel -     US Venous Img Lower Unilateral Left; Future  Right hip pain -     Basic metabolic panel -     US Venous Img Lower Unilateral Left; Future  Lower extremity pain, diffuse, right -     Basic metabolic panel -     US Venous Img Lower Unilateral Left; Future  Calf swelling -     Basic metabolic panel -     US Venous Img Lower Unilateral Left; Future  Tachycardia -     US Venous Img Lower Unilateral Left; Future  Uncontrolled other specified diabetes mellitus with hyperglycemia (HCC)  Smoker  Other orders -     Insulin Glulisine (APIDRA SOLOSTAR) 100 UNIT/ML Solostar Pen; Inject 8 Units into the skin 3 (three) times daily.

## 2018-11-15 NOTE — Patient Instructions (Addendum)
Your right leg is somewhat swollen in the calf today and your exam makes me concerned about a blood clot  We are checking a blood test today and we are going to set you up for an ultrasound of your right leg to determine if there is a blood clot  Avoid any type of injury or trauma to your body in the meantime  We will call back with results and next steps  You also have right hip pain which may suggest some arthritis but we need to rule out blood clot first   Diabetes  For now continue the Apidra insulin 8 units each meal  I wrote a prescription today for glucometer and testing supplies to start checking her sugars before each meal and write these numbers down please so you can bring them in next time  We sent the Apidra medication to the pharmacy today.  If there is any issue where you cannot get that medicine today please call back

## 2018-11-18 ENCOUNTER — Telehealth: Payer: Self-pay | Admitting: Medical

## 2018-11-18 NOTE — Telephone Encounter (Signed)
P.A. APIDRA, preferred is Novolog ad Novolin

## 2018-11-20 ENCOUNTER — Encounter: Payer: Self-pay | Admitting: Medical

## 2018-11-22 ENCOUNTER — Ambulatory Visit: Payer: Self-pay | Admitting: Medical

## 2018-11-27 NOTE — Telephone Encounter (Signed)
P.A. Gibson Rampenied pt needs trial of Novolin or Novolog, do you want to switch?

## 2018-11-28 ENCOUNTER — Ambulatory Visit: Payer: Self-pay | Admitting: Medical

## 2018-11-28 NOTE — Telephone Encounter (Signed)
What are his glucose readings the last 3-4 days?  Is he out of the insulin samples?

## 2018-11-29 NOTE — Telephone Encounter (Signed)
Left message for pt

## 2018-12-06 NOTE — Telephone Encounter (Signed)
Left another message for pt.

## 2018-12-23 NOTE — Telephone Encounter (Signed)
Left another message for pt.

## 2019-02-12 ENCOUNTER — Ambulatory Visit (HOSPITAL_COMMUNITY)
Admission: EM | Admit: 2019-02-12 | Discharge: 2019-02-12 | Disposition: A | Discharge status: Home / Self Care | Payer: BLUE CROSS/BLUE SHIELD | Attending: Internal Medicine | Admitting: Internal Medicine

## 2019-02-12 ENCOUNTER — Encounter (HOSPITAL_COMMUNITY): Payer: Self-pay | Admitting: Urgent Care

## 2019-02-12 ENCOUNTER — Other Ambulatory Visit: Payer: Self-pay

## 2019-02-12 DIAGNOSIS — R112 Nausea with vomiting, unspecified: Secondary | ICD-10-CM

## 2019-02-12 DIAGNOSIS — F209 Schizophrenia, unspecified: Secondary | ICD-10-CM

## 2019-02-12 DIAGNOSIS — A084 Viral intestinal infection, unspecified: Secondary | ICD-10-CM

## 2019-02-12 DIAGNOSIS — R197 Diarrhea, unspecified: Secondary | ICD-10-CM

## 2019-02-12 DIAGNOSIS — E1165 Type 2 diabetes mellitus with hyperglycemia: Secondary | ICD-10-CM

## 2019-02-12 LAB — POCT URINALYSIS DIP (DEVICE)
Bilirubin Urine: NEGATIVE
Glucose, UA: 500 mg/dL — AB
Ketones, ur: NEGATIVE mg/dL
Leukocytes,Ua: NEGATIVE
Nitrite: NEGATIVE
Protein, ur: >=300 mg/dL — AB
Specific Gravity, Urine: 1.02 (ref 1.005–1.030)
Urobilinogen, UA: 0.2 mg/dL (ref 0.0–1.0)
pH: 5.5 (ref 5.0–8.0)

## 2019-02-12 LAB — GLUCOSE, CAPILLARY: Glucose-Capillary: 399 mg/dL — ABNORMAL HIGH (ref 70–99)

## 2019-02-12 MED ORDER — ONDANSETRON HCL 4 MG/2ML IJ SOLN
INTRAMUSCULAR | Status: AC
  Filled 2019-02-12: qty 2

## 2019-02-12 MED ORDER — LOPERAMIDE HCL 2 MG PO CAPS: 2.0000 mg | ORAL_CAPSULE | Freq: Every day | ORAL | 0 refills | Status: DC | PRN

## 2019-02-12 MED ORDER — ONDANSETRON 8 MG PO TBDP: 8.0000 mg | ORAL_TABLET | Freq: Three times a day (TID) | ORAL | 0 refills | Status: DC | PRN

## 2019-02-12 MED ORDER — ACETAMINOPHEN 325 MG PO TABS
ORAL_TABLET | ORAL | Status: AC
  Filled 2019-02-12: qty 2

## 2019-02-12 MED ORDER — ACETAMINOPHEN 325 MG PO TABS
650.0000 mg | ORAL_TABLET | Freq: Once | ORAL | Status: AC
  Administered 2019-02-12: 650 mg via ORAL

## 2019-02-12 MED ORDER — ONDANSETRON HCL 4 MG/2ML IJ SOLN
4.0000 mg | Freq: Once | INTRAMUSCULAR | Status: AC
  Administered 2019-02-12: 4 mg via INTRAMUSCULAR

## 2019-02-12 NOTE — Discharge Instructions (Signed)
[facility]

## 2019-02-12 NOTE — ED Provider Notes (Signed)
MRN: [id number] DOB: [birth year]  Subjective:   Rodney Rose is a 43 y.o. male presenting for 3-day history of nausea with vomiting (a couple of episodes per day), multiple bouts of diarrhea (about 4-6 episodes per day).  Patient also has severely uncontrolled diabetes, has not taken his insulin due to lack of insurance coverage.  He has been out of this for 5 months now.  Patient also has schizophrenia but he has been compliant with his medications.  His sister presents with him and has significant concern over his diabetes and his current illness.   No current facility-administered medications for this encounter.   Current Outpatient Medications:  .  haloperidol (HALDOL) 5 MG tablet, Take 5 mg by mouth at bedtime., Disp: , Rfl:  .  Azilsartan-Chlorthalidone 40-12.5 MG TABS, Take 1 tablet by mouth daily., Disp: 30 tablet, Rfl: 1 .  ibuprofen (ADVIL,MOTRIN) 800 MG tablet, Take 1 tablet (800 mg total) by mouth 3 (three) times daily., Disp: 21 tablet, Rfl: 0 .  Insulin Glargine, 2 Unit Dial, (TOUJEO MAX SOLOSTAR) 300 UNIT/ML SOPN, Inject 20 Units into the skin at bedtime., Disp: 3 mL, Rfl: 2 .  Insulin Pen Needle (BD PEN NEEDLE NANO U/F) 32G X 4 MM MISC, 1 each by Does not apply route at bedtime., Disp: 100 each, Rfl: 11 .  traMADol (ULTRAM) 50 MG tablet, Take 1 tablet (50 mg total) by mouth every 6 (six) hours as needed., Disp: 15 tablet, Rfl: 0    Allergies  Allergen Reactions  . Penicillins Swelling    Past Medical History:  Diagnosis Date  . Bipolar disorder (HCC)    Dr. Georgia Lopes with [facility] Psychiatry  . Chronic back pain   . Hypertension 2017  . Schizophrenia [facility])      Past Surgical History:  Procedure Laterality Date  . NO PAST SURGERIES  10/2018    Review of Systems  Constitutional: Positive for fever and malaise/fatigue.  HENT: Negative for congestion, ear pain, sinus pain and sore throat.   Eyes: Negative for blurred vision, double vision, discharge and redness.   Respiratory: Negative for cough, hemoptysis, shortness of breath and wheezing.   Cardiovascular: Negative for chest pain.  Gastrointestinal: Positive for abdominal pain, diarrhea, nausea and vomiting. Negative for blood in stool and constipation.  Genitourinary: Negative for dysuria, flank pain and hematuria.  Musculoskeletal: Negative for myalgias.  Skin: Negative for rash.  Neurological: Negative for dizziness, weakness and headaches.  Psychiatric/Behavioral: Negative for depression and substance abuse.    Objective:   Vitals: BP 138/70 (BP Location: Right Arm)   Pulse (!) 106   Temp (!) 100.5 F (38.1 C) (Temporal)   Resp 18   SpO2 100%   Physical Exam Constitutional:      General: He is not in acute distress.    Appearance: Normal appearance. He is well-developed. He is not ill-appearing, toxic-appearing or diaphoretic.  HENT:     Head: Normocephalic and atraumatic.     Right Ear: External ear normal.     Left Ear: External ear normal.     Nose: Nose normal.     Mouth/Throat:     Mouth: Mucous membranes are moist.     Pharynx: Oropharynx is clear.  Eyes:     General: No scleral icterus.    Extraocular Movements: Extraocular movements intact.     Pupils: Pupils are equal, round, and reactive to light.  Cardiovascular:     Rate and Rhythm: Normal rate and regular rhythm.  Heart sounds: Normal heart sounds. No murmur. No friction rub. No gallop.   Pulmonary:     Effort: Pulmonary effort is normal. No respiratory distress.     Breath sounds: Normal breath sounds. No stridor. No wheezing, rhonchi or rales.  Neurological:     Mental Status: He is alert and oriented to person, place, and time.  Psychiatric:        Mood and Affect: Mood normal.        Behavior: Behavior normal.        Thought Content: Thought content normal.    Results for orders placed or performed during the hospital encounter of 02/12/19 (from the past 24 hour(s))  POCT urinalysis dip (device)      Status: Abnormal   Collection Time: 02/12/19  8:33 PM  Result Value Ref Range   Glucose, UA 500 (A) NEGATIVE mg/dL   Bilirubin Urine NEGATIVE NEGATIVE   Ketones, ur NEGATIVE NEGATIVE mg/dL   Specific Gravity, Urine 1.020 1.005 - 1.030   Hgb urine dipstick MODERATE (A) NEGATIVE   pH 5.5 5.0 - 8.0   Protein, ur >=300 (A) NEGATIVE mg/dL   Urobilinogen, UA 0.2 0.0 - 1.0 mg/dL   Nitrite NEGATIVE NEGATIVE   Leukocytes,Ua NEGATIVE NEGATIVE  Glucose, capillary     Status: Abnormal   Collection Time: 02/12/19  8:35 PM  Result Value Ref Range   Glucose-Capillary 399 (H) 70 - 99 mg/dL   Comment 1 Notify RN    Comment 2 Document in Chart     Assessment and Plan :   Nausea vomiting and diarrhea  Viral gastroenteritis  Uncontrolled type 2 diabetes mellitus with hyperglycemia (HCC)  Schizophrenia, unspecified type (HCC)  Patient has severely uncontrolled diabetes and needs to be on insulin.  I counseled that he needs to establish care to make sure that he has his medications squared away.  For now he has a viral gastroenteritis and some signs of dehydration but no signs of diabetic ketoacidosis.  Will have patient use supportive care.  Strict ER precautions discussed.   Wallis Bamberg, [place] 02/12/19 2054

## 2019-02-12 NOTE — ED Triage Notes (Addendum)
Vomiting and diarrhea for 3 days.  2 vomiting episodes today, 4-5 episodes today of diarrhea  Patient is out of insulin for 5 months.

## 2019-02-12 NOTE — ED Notes (Signed)
CBG 399 reported to [facility]

## 2019-02-14 ENCOUNTER — Emergency Department (HOSPITAL_COMMUNITY)
Admission: EM | Admit: 2019-02-14 | Discharge: 2019-02-14 | Disposition: A | Discharge status: Home / Self Care | Payer: BLUE CROSS/BLUE SHIELD | Attending: Emergency Medicine | Admitting: Emergency Medicine

## 2019-02-14 ENCOUNTER — Other Ambulatory Visit: Payer: Self-pay

## 2019-02-14 ENCOUNTER — Encounter (HOSPITAL_COMMUNITY): Payer: Self-pay | Admitting: Emergency Medicine

## 2019-02-14 DIAGNOSIS — F209 Schizophrenia, unspecified: Secondary | ICD-10-CM | POA: Insufficient documentation

## 2019-02-14 DIAGNOSIS — Z794 Long term (current) use of insulin: Secondary | ICD-10-CM | POA: Insufficient documentation

## 2019-02-14 DIAGNOSIS — E1165 Type 2 diabetes mellitus with hyperglycemia: Secondary | ICD-10-CM | POA: Insufficient documentation

## 2019-02-14 DIAGNOSIS — F319 Bipolar disorder, unspecified: Secondary | ICD-10-CM | POA: Insufficient documentation

## 2019-02-14 DIAGNOSIS — R739 Hyperglycemia, unspecified: Secondary | ICD-10-CM

## 2019-02-14 DIAGNOSIS — I10 Essential (primary) hypertension: Secondary | ICD-10-CM | POA: Insufficient documentation

## 2019-02-14 DIAGNOSIS — F17210 Nicotine dependence, cigarettes, uncomplicated: Secondary | ICD-10-CM | POA: Diagnosis not present

## 2019-02-14 DIAGNOSIS — Z79899 Other long term (current) drug therapy: Secondary | ICD-10-CM | POA: Diagnosis not present

## 2019-02-14 LAB — CBC
HCT: 34.9 % — ABNORMAL LOW (ref 39.0–52.0)
Hemoglobin: 11.7 g/dL — ABNORMAL LOW (ref 13.0–17.0)
MCH: 26 pg (ref 26.0–34.0)
MCHC: 33.5 g/dL (ref 30.0–36.0)
MCV: 77.6 fL — ABNORMAL LOW (ref 80.0–100.0)
Platelets: 183 10*3/uL (ref 150–400)
RBC: 4.5 MIL/uL (ref 4.22–5.81)
RDW: 13.2 % (ref 11.5–15.5)
WBC: 9.6 10*3/uL (ref 4.0–10.5)
nRBC: 0 % (ref 0.0–0.2)

## 2019-02-14 LAB — BASIC METABOLIC PANEL
Anion gap: 11 (ref 5–15)
BUN: 12 mg/dL (ref 6–20)
CO2: 27 mmol/L (ref 22–32)
Calcium: 8.3 mg/dL — ABNORMAL LOW (ref 8.9–10.3)
Chloride: 99 mmol/L (ref 98–111)
Creatinine, Ser: 1.44 mg/dL — ABNORMAL HIGH (ref 0.61–1.24)
Glucose, Bld: 312 mg/dL — ABNORMAL HIGH (ref 70–99)
Potassium: 3 mmol/L — ABNORMAL LOW (ref 3.5–5.1)
Sodium: 137 mmol/L (ref 135–145)

## 2019-02-14 LAB — CBG MONITORING, ED: Glucose-Capillary: 299 mg/dL — ABNORMAL HIGH (ref 70–99)

## 2019-02-14 MED ORDER — INSULIN PEN NEEDLE 32G X 4 MM MISC: 1.0000 | Freq: Every day | 0 refills | Status: DC

## 2019-02-14 MED ORDER — INSULIN GLARGINE (2 UNIT DIAL) 300 UNIT/ML ~~LOC~~ SOPN: 20.0000 [IU] | PEN_INJECTOR | Freq: Every day | SUBCUTANEOUS | 0 refills | Status: DC

## 2019-02-14 NOTE — Care Management (Signed)
ED CM consulted by Dr. Vanita Panda concerning patient needing insulin assistance.  ED CM met with patient at bedside. Patient not the best historian but states he has not taken his insulin for 5 months. CM noted patient is followed by the [facility], last visit 10/2018. Patient said that he was not able to pay the last time it was escribed to the pharmacy. He said he cannot afford to pay for the medication. ED CM advised patient contact his PCP and discuss a cheaper anti-diabetic alternative patient verbalized understanding. ED CM discussed with EDP who wants requesting that CM get him f/u with PCP to sort out the insulin issuse, we can attempt to assist with medications assistance this evening.  Discussed MATCH program and the guidelines patient verbalized understanding and teach back done. Letter printed and [facility] to [facility] on [address].   CM spoke with Pharmacist at [facility] concerned that medication may need override. Override done, CM sent message to PCP to re-evaluate for a more affordable medication.  Updated EDP. CM will follow up tomorrow.  Wendi Maya RN BSN NCM [phone]

## 2019-02-14 NOTE — ED Notes (Signed)
Patient verbalizes understanding of discharge instructions. Opportunity for questioning and answers were provided. Armband removed by staff, pt discharged from ED. Prescriptions and pharmacy reviewed. Pt aware [facility] needs [facility] to [facility] [address] to pick up medicine. Follow up care reviewed. Pt taken home by family.

## 2019-02-14 NOTE — ED Provider Notes (Signed)
[facility] EMERGENCY DEPARTMENT Provider Note   CSN: [id number] Arrival date & time: 02/14/19  1510    History   Chief Complaint Chief Complaint  Patient presents with  . Hyperglycemia    HPI Rodney Rose is a 43 y.o. male.     HPI Patient presents with concern of hyperglycemia. Patient has had nausea, vomiting, diarrhea recently but denies any currently.  He also denies any pain, lightheadedness, syncope. Patient has a history of multiple medical issues, pertinently diabetes for which she stopped taking his medication about 5 months ago due to cost. He notes that he has obtained prescriptions, but upon taking them to pharmacy, the cost was prohibitive, and he has not been able to make arrangements for suitable alternative. He presents today due to evaluation 2 days ago for hyperglycemia without resolution.  Initially the patient is alone, but he is subsequently joined by his sister and mother who assist with history.   Past Medical History:  Diagnosis Date  . Bipolar disorder (HCC)    Dr. Georgia Lopes with [facility] Psychiatry  . Chronic back pain   . Hypertension 2017  . Schizophrenia [facility])     Patient Active Problem List   Diagnosis Date Noted  . Right hip pain 11/15/2018  . Tachycardia 11/15/2018  . Calf swelling 11/15/2018  . Elevated blood sugar 11/03/2018  . Diabetes mellitus, new onset (HCC) 11/03/2018  . Uncontrolled diabetes mellitus (HCC) 11/03/2018  . Mixed dyslipidemia 11/03/2018  . Abnormal EKG 11/03/2018  . Essential hypertension, benign 11/02/2018  . Smoker 11/02/2018  . Bipolar disorder (HCC) 11/02/2018  . Edema 11/02/2018    Past Surgical History:  Procedure Laterality Date  . NO PAST SURGERIES  10/2018        Home Medications    Prior to Admission medications   Medication Sig Start Date End Date Taking? Authorizing Provider  Azilsartan-Chlorthalidone 40-12.5 MG TABS Take 1 tablet by mouth daily. 11/02/18   Tysinger,  Kermit Balo, PA-C  haloperidol (HALDOL) 5 MG tablet Take 5 mg by mouth at bedtime.    [provider]  ibuprofen (ADVIL,MOTRIN) 800 MG tablet Take 1 tablet (800 mg total) by mouth 3 (three) times daily. 03/20/15   Elpidio Anis, PA-C  Insulin Glargine, 2 Unit Dial, (TOUJEO MAX SOLOSTAR) 300 UNIT/ML SOPN Inject 20 Units into the skin at bedtime. 11/15/18   Tysinger, Kermit Balo, PA-C  Insulin Pen Needle (BD PEN NEEDLE NANO U/F) 32G X 4 MM MISC 1 each by Does not apply route at bedtime. 11/15/18   Tysinger, Kermit Balo, PA-C  loperamide (IMODIUM) 2 MG capsule Take 1 capsule (2 mg total) by mouth daily as needed for diarrhea or loose stools. 02/12/19   Wallis Bamberg, PA-C  ondansetron (ZOFRAN-ODT) 8 MG disintegrating tablet Take 1 tablet (8 mg total) by mouth every 8 (eight) hours as needed for nausea or vomiting. 02/12/19   Wallis Bamberg, PA-C  traMADol (ULTRAM) 50 MG tablet Take 1 tablet (50 mg total) by mouth every 6 (six) hours as needed. 11/15/18 11/15/19  Tysinger, Kermit Balo, PA-C    Family History Family History  Problem Relation Age of Onset  . Hypertension Mother   . COPD Father   . Asthma Sister   . Heart disease Neg Hx   . Stroke Neg Hx   . Diabetes Neg Hx     Social History Social History   Tobacco Use  . Smoking status: Current Every Day Smoker    Packs/day: 1.00  Years: 6.00    Pack years: 6.00    Types: Cigarettes  . Smokeless tobacco: Never Used  Substance Use Topics  . Alcohol use: No  . Drug use: No     Allergies   Penicillins   Review of Systems Review of Systems  Constitutional:       Per HPI, otherwise negative  HENT:       Per HPI, otherwise negative  Respiratory:       Per HPI, otherwise negative  Cardiovascular:       Per HPI, otherwise negative  Gastrointestinal: Negative for vomiting.  Endocrine:       Negative aside from HPI  Genitourinary:       Neg aside from HPI   Musculoskeletal:       Per HPI, otherwise negative  Skin: Negative.     Neurological: Negative for syncope.  Psychiatric/Behavioral: Positive for decreased concentration.     Physical Exam Updated Vital Signs BP (!) 167/82   Pulse 96   Temp 98.6 F (37 C)   Resp 15   Ht 6\' 2"  (1.88 m)   Wt 96.2 kg   SpO2 100%   BMI 27.22 kg/m   Physical Exam Vitals signs and nursing note reviewed.  Constitutional:      General: He is not in acute distress.    Appearance: He is well-developed.  HENT:     Head: Normocephalic and atraumatic.  Eyes:     Conjunctiva/sclera: Conjunctivae normal.  Cardiovascular:     Rate and Rhythm: Normal rate and regular rhythm.  Pulmonary:     Effort: Pulmonary effort is normal. No respiratory distress.     Breath sounds: No stridor.  Abdominal:     General: There is no distension.  Skin:    General: Skin is warm and dry.  Neurological:     Mental Status: He is alert and oriented to person, place, and time.  Psychiatric:     Comments: Cognitive delay is apparent      ED Treatments / Results  Labs (all labs ordered are listed, but only abnormal results are displayed) Labs Reviewed  BASIC METABOLIC PANEL - Abnormal; Notable for the following components:      Result Value   Potassium 3.0 (*)    Glucose, Bld 312 (*)    Creatinine, Ser 1.44 (*)    Calcium 8.3 (*)    All other components within normal limits  CBC - Abnormal; Notable for the following components:   Hemoglobin 11.7 (*)    HCT 34.9 (*)    MCV 77.6 (*)    All other components within normal limits  CBG MONITORING, ED - Abnormal; Notable for the following components:   Glucose-Capillary 299 (*)    All other components within normal limits  URINALYSIS, ROUTINE W REFLEX MICROSCOPIC  CBG MONITORING, ED    Procedures Procedures (including critical care time)  Medications Ordered in ED Medications - No data to display   Initial Impression / Assessment and Plan / ED Course  I have reviewed the triage vital signs and the nursing notes.  Pertinent  labs & imaging results that were available during my care of the patient were reviewed by me and considered in my medical decision making (see chart for details).       Glucose 312, with no anion gap, no distress, no complaints, low suspicion for DKA. After the initial evaluation I discussed the patient's case with our social work/care management team, who assisted with providing  the patient voucher to obtain his current medication regimen. In addition, we discussed the importance of following up with primary care to reconsider his insulin products, with possibility for adjustment based on affordability. Absent distress comes absent focal physical complaints, hemodynamic instability, with the patient obtained new medication prescriptions, and was discharged in stable condition.   Final Clinical Impressions(s) / ED Diagnoses   Final diagnoses:  Hyperglycemia    ED Discharge Orders         Ordered    Insulin Glargine, 2 Unit Dial, (TOUJEO MAX SOLOSTAR) 300 UNIT/ML SOPN  Daily at bedtime    Note to Pharmacy:  D/C Apidra.   If this Toujeo too expensive, I need some other "covered" basal insulin!   02/14/19 1836    Insulin Pen Needle (BD PEN NEEDLE NANO U/F) 32G X 4 MM MISC  Daily at bedtime     02/14/19 1836           Gerhard MunchLockwood, Zaivion Kundrat, MD 02/14/19 [phone]

## 2019-02-14 NOTE — Discharge Instructions (Addendum)
As discussed, your evaluation today has been largely reassuring.  But, it is important that you monitor your condition carefully, and do not hesitate to return to the ED if you develop new, or concerning changes in your condition.  You have been provided resources for obtaining your medication with assistance. This should provide sufficient medication for 1 month supply, during which it is important to discuss today's presentation, and your need for consideration of new medication regimen with your physician.

## 2019-02-14 NOTE — ED Triage Notes (Signed)
Pt reports having high blood sugars, recently seen for n/v/d but reports symptoms resolved.   Pt has been off of his insulin 5 months, per sister they have insurance but were trying to find a way to get the insulin at a lower cost.

## 2019-04-09 ENCOUNTER — Ambulatory Visit: Payer: BLUE CROSS/BLUE SHIELD | Admitting: Medical

## 2019-04-09 ENCOUNTER — Other Ambulatory Visit: Payer: Self-pay

## 2019-04-09 ENCOUNTER — Telehealth: Payer: Self-pay | Admitting: Medical

## 2019-04-09 ENCOUNTER — Encounter: Payer: Self-pay | Admitting: Medical

## 2019-04-09 VITALS — Wt 212.0 lb

## 2019-04-09 DIAGNOSIS — E782 Mixed hyperlipidemia: Secondary | ICD-10-CM | POA: Diagnosis not present

## 2019-04-09 DIAGNOSIS — I10 Essential (primary) hypertension: Secondary | ICD-10-CM | POA: Diagnosis not present

## 2019-04-09 DIAGNOSIS — E1365 Other specified diabetes mellitus with hyperglycemia: Secondary | ICD-10-CM | POA: Diagnosis not present

## 2019-04-09 DIAGNOSIS — N289 Disorder of kidney and ureter, unspecified: Secondary | ICD-10-CM

## 2019-04-09 DIAGNOSIS — F17200 Nicotine dependence, unspecified, uncomplicated: Secondary | ICD-10-CM | POA: Diagnosis not present

## 2019-04-09 MED ORDER — GLYBURIDE-METFORMIN 2.5-500 MG PO TABS: 1.0000 | ORAL_TABLET | Freq: Two times a day (BID) | ORAL | 1 refills | Status: DC

## 2019-04-09 MED ORDER — LOSARTAN POTASSIUM-HCTZ 50-12.5 MG PO TABS: 1.0000 | ORAL_TABLET | Freq: Every day | ORAL | 1 refills | Status: DC

## 2019-04-09 NOTE — Progress Notes (Signed)
Subjective:     Patient ID: Rodney Rose, male   DOB: 08/23/1976, 43 y.o.   MRN: [id number]  This visit type was conducted due to national recommendations for restrictions regarding the COVID-19 Pandemic (e.g. social distancing) in an effort to limit this patient's exposure and mitigate transmission in our community.  Due to their co-morbid illnesses, this patient is at least at moderate risk for complications without adequate follow up.  This format is felt to be most appropriate for this patient at this time.    Documentation for virtual audio and video telecommunications through Zoom encounter:  The patient was located at home. The provider was located in the office. The patient did consent to this visit and is aware of possible charges through their insurance for this visit.  The other persons participating in this telemedicine service were none. Time spent on call was 20 minutes and in review of previous records >30 minutes total.  This virtual service is not related to other E/M service within previous 7 days.   HPI Chief Complaint  Patient presents with  . diabetic follow-up    diabetic follow-up. checking bs every once in while. doesn't remember reading   Virtual visit today for med check.  He has a history of uncontrolled diabetes diagnosed 10/2018 here, hypertension, mixed dyslipidemia, hyperglycemia, smoker, history of bipolar disorder.  He started here as a new patient in November 2020.  Diabetes- we had some problems with insurance coverage of medications back in the fall 2019 when we diagnosed him with diabetes.  As of his visit in November he was using Apidra 8 units 3 times a day at mealtime.  He ultimately couldn't afford medications at the pharmacy and stopped the medications without communicating with us on next steps.   He was seen in the emergency dept 01/2019 due to hyperglycemia, was started on Toujeo 20 units nightly given problems getting other insulins covered  by insurance.  Used it until he ran out 3 weeks.  He is checking sugar some, seeing low 200 readings regularly.  Try to eat healthy, exercise somewhat walking.  Hypertension- back at his last visit we had him on as losartan chlorthalidone 40/12.5 mg but he is not currently taking this.  Not currently taking medication.  Last check BP 2 months ago, was elevated  He currently denies any symptoms, no polyuria, no polydipsia, no weight changes, no chest pain, no swelling in the legs.  Feels fine in general  He is currently unemployed, living with his sister   Past Medical History:  Diagnosis Date  . Bipolar disorder (HCC)    [facility]menu [facility]  . Chronic back pain   . Hypertension 2017  . Schizophrenia (HCC)     Review of Systems As in subjective      Objective:   Physical Exam  Wt 212 lb (96.2 kg)   BMI 27.22 kg/m   Due to coronavirus pandemic stay at home measures, patient visit was virtual and they were not examined in person.   Gen: wd, wn, nad Pleasant, answers questions appropriately      Assessment:     Encounter Diagnoses  Name Primary?  . Essential hypertension, benign Yes  . Uncontrolled other specified diabetes mellitus with hyperglycemia (HCC)   . Mixed dyslipidemia   . Smoker   . Renal insufficiency        Plan:     Of note, he was newly diagnosed with diabetes and high blood pressure in  November of last year 2019 but due to lack of insurance, medication expense, insurance coverage lacking for certain medicines, he has been lost to follow-up and was not compliant with treatment  He is also had emergency department visit for hyperglycemia since last visit here  Today we took time to discuss again the diagnosis of diabetes and high blood pressure, possible complications, treatment recommendations and follow-up.  We discussed the recommendations below and emailed him the summary   Diabetes  It is important to keep your blood sugars  under control to avoid complications such as kidney disease, damage to your eyes, damage to your heart, damage to the nerves  Diabetes can have a lot of bad complications which is why we want to get your sugars under control  Begin the following medications: Glyburide/metformin 2.5/500 mg, 1 tablet daily for the first week, then 1 tablet twice daily in general  If you start having problems with diarrhea or nausea with this medicine let me know  I would like you to check your blood sugars fasting at least a few mornings per week.  The goal was to see blood sugars less than 130 fasting.  You currently report that your sugars are running over 200 which is not good  We started you on insulin back in November given sugar readings in the 400s, but you may not need to be on that right now.  Plus we had a problem with insurance coverage on the insulin  Check your feet daily for sores or wounds that are not healing and let us know if you find the problem with this  You should see an eye doctor and dentist every year for routine care and diabetic care  You should be seeing [place] every 4 to 6 months for diabetes until the numbers are well controlled   Hypertension  Similar to diabetes, if your blood pressures not under control, you risk damage to the heart, risk of potential heart attack stroke or kidney damage  Begin losartan/HCT 50/12.5 mg, 1 tablet daily for blood pressure  I would like you to check your blood pressures several days per week  The goal is 120/70   Mixed dyslipidemia To not overwhelm patient, we will discuss next visit  Tobacco use-advised cessation  Rodney Rose was seen today for diabetic follow-up.  Diagnoses and all orders for this visit:  Essential hypertension, benign  Uncontrolled other specified diabetes mellitus with hyperglycemia (HCC)  Mixed dyslipidemia  Smoker  Renal insufficiency  Other orders -     losartan-hydrochlorothiazide (HYZAAR) 50-12.5 MG tablet;  Take 1 tablet by mouth daily. -     glyBURIDE-metformin (GLUCOVANCE) 2.5-500 MG tablet; Take 1 tablet by mouth 2 (two) times daily with a meal.  f/u 6-8 weeks

## 2019-04-09 NOTE — Telephone Encounter (Signed)
Pt has an appt for today.

## 2019-04-09 NOTE — Telephone Encounter (Signed)
Please schedule a virtual med check visit, diabetes  

## 2019-04-09 NOTE — Patient Instructions (Signed)
Please take the time read over the information below I want to see you back in 6 to 8 weeks fasting for an office visit and recheck with labs If you cannot get your medicines or they are too expensive let me know right away   Diabetes  It is important to keep your blood sugars under control to avoid complications such as kidney disease, damage to your eyes, damage to your heart, damage to the nerves  Diabetes can have a lot of bad complications which is why we want to get your sugars under control  Begin the following medications: Glyburide/metformin 2.5/500 mg, 1 tablet daily for the first week, then 1 tablet twice daily in general  If you start having problems with diarrhea or nausea with this medicine let me know  I would like you to check your blood sugars fasting at least a few mornings per week.  The goal was to see blood sugars less than 130 fasting.  You currently report that your sugars are running over 200 which is not good  We started you on insulin back in November given sugar readings in the 400s, but you may not need to be on that right now.  Plus we had a problem with insurance coverage on the insulin  Check your feet daily for sores or wounds that are not healing and let us know if you find the problem with this  You should see an eye doctor and dentist every year for routine care and diabetic care  You should be seeing [place] every 4 to 6 months for diabetes until the numbers are well controlled   Type 2 Diabetes  Diabetes is a long-lasting (chronic) disease.  With diabetes, either the pancreas does not make enough of a hormone called insulin, or the body has trouble using the insulin that is made.  Over time, diabetes can damage the eyes, kidneys, and nerves causing retinopathy, nephropathy, and neuropathy.  Diabetes puts you at risk for heart disease and peripheral vascular disease which can lead to heart attack, stroke, foot ulcers, and amputations.    Our goal and  hopefully your goal is to manage your diabetes in such a way to slow the progression of the disease and do all we can to keep you healthy  Home Care:   Eat healthy, exercise regularly, limit alcohol, and don't smoke!  Check your blood sugar (glucose) once a day before breakfast, or as indicated by our discussion today.  Take your medications daily, don't run out of medications.  Learn about low blood sugar (hypoglycemia). Know how to treat it.  Wear a necklace or bracelet that says you have diabetes.  Check your feet every night for cuts, sores, blisters, and redness. Tell your medical provider if you have problems.  Maintain a normal body weight, or normal BMI - height to weight ratio of 20-25.  Ask me about this.  GET HELP RIGHT AWAY IF:  You have trouble keeping your blood sugar in target range.  You have problems with your medicines.  You are sick and not getting better after 24 hours.  You have a sore or wound that is not healing.  You have vision problems or changes.  You have a fever.   Exercise regularly since it has beneficial effects on the heart and blood sugars. Exercising at least three times per week or 150 minutes per week can be as important as medication to a diabetic.  Find some form of exercise that you  will enjoy doing regularly.  This can include walking, biking, kayaking, golfing, swimming, dance, aerobics, hiking, etc.  If you have joint problems, many local gyms have equipment to accommodate people with specific needs.    Vaccinations:  Diabetics are at increased risk for infection, and illnesses can take longer to resolve.  Current vaccine recommendations include yearly Influenza (flu) vaccine (recommended in October), Pneumococcal vaccine, Hepatitis B vaccine series, Tdap (tetanus, diptheria and pertussis) vaccine every 10 years, and other age appropriate vaccinations.     Office visits:  We recommend routine medical care to make sure we are addressing  prevention and issues as they arise.  Typically this could mean twice yearly or up to quarterly depending upon your unique health situation.  Exams should include a yearly physical, a yearly foot exam, and other examination as appropriate.  You should see an eye doctor yearly to help screen for and prevent blood vessel complications in your eyes.  Labs: Diabetics should have blood work done at least twice yearly to monitor your Hemoglobin A1C (a three-month average of your blood sugars) and your cholesterol.  You should have your urine and blood checked yearly to screen for kidney damage.  This may include creatinine and micro-albumin levels.  Other labs as appropriate.    Blood pressure goals:  Goal blood pressure in diabetics should be 130/80 or less. Monitoring your blood pressure with a home blood pressure cuff of your own is an excellent idea.  If you are prescribed medication for blood pressure, take your medication every day, and don't run out of medication.  Having high blood pressure can damage your heart, eyes, kidneys, and put you at risk for heart attack and stroke.  Tobacco use:  If you smoke, dip or chew, quitting will reduce your risk of heart attack, stroke, peripheral vascular disease, and many cancers.    Diabetic Report Card for [facility]  April 09, 2019  Below is a summary of recent tests related to your diabetes that can help you manage your health.   Hemoglobin A1C:  Your Hemoglobin A1C values should be less than 7. If these are greater than 7, you have a higher chance of having eye, heart, and kidney problems in the future.   Your most recent Hemoglobin A1C values were:  Hemoglobin A1C (%)  Date Value  11/03/2018 11.1 (A)     Cholesterol:  Your LDL Cholesterol (bad cholesterol) values should be less than 100 mg/dL if you do not have cardiovascular disease.  The LDL should be less than 70 mg/dL if you do have cardiovascular disease.  If your LDL is consistently  higher than 100 mg/dL, then your risk of heart attack and stroke increases yearly.   Your most recent LDL Cholesterol (bad cholesterol) results were:  LDL Calculated (mg/dL)  Date Value  16/09/9603 155 (H)     Your HDL Cholesterol (good cholesterol) values should be higher than 40 mg/dL.  If your HDL is lower than 40 mg/dL, this increases your risk of heart attack and stroke.     Blood Pressure:  Your blood pressure values should be less than 130/80. Please contact me if your readings at home are consistently higher than this.   Your most recent blood pressure readings at our clinic were:  BP Readings from Last 3 Encounters:  02/14/19 (!) 170/89  02/12/19 138/70  11/15/18 (!) 142/90    Urine Protein: Having an elevated microalbumin to creatinine ratio is a marker for early kidney damage  due to diabetes or high blood pressure.       High blood pressure  Similar to diabetes, if your blood pressures not under control, you risk damage to the heart, risk of potential heart attack stroke or kidney damage  Begin losartan/HCT 50/12.5 mg, 1 tablet daily for blood pressure  I would like you to check your blood pressures several days per week  The goal is 120/70    Hypertension, commonly called high blood pressure, is when the force of blood pumping through your arteries is too strong. Your arteries are the blood vessels that carry blood from your heart throughout your body. A blood pressure reading consists of a higher number over a lower number, such as 110/72. The higher number (systolic) is the pressure inside your arteries when your heart pumps. The lower number (diastolic) is the pressure inside your arteries when your heart relaxes. Ideally you want your blood pressure below 120/80. Hypertension forces your heart to work harder to pump blood. Your arteries may become narrow or stiff. Having hypertension puts you at risk for heart disease, stroke, and other problems.  RISK  FACTORS Some risk factors for high blood pressure are controllable. Others are not.  Risk factors you cannot control include:   Race. You may be at higher risk if you are African American.  Age. Risk increases with age.  Gender. Men are at higher risk than women before age 61 years. After age 49, women are at higher risk than men. Risk factors you can control include:  Not getting enough exercise or physical activity.  Being overweight.  Getting too much fat, sugar, calories, or salt in your diet.  Drinking too much alcohol. SIGNS AND SYMPTOMS Hypertension does not usually cause signs or symptoms. Extremely high blood pressure (hypertensive crisis) may cause headache, anxiety, shortness of breath, and nosebleed. DIAGNOSIS  To check if you have hypertension, your health care provider will measure your blood pressure while you are seated, with your arm held at the level of your heart. It should be measured at least twice using the same arm. Certain conditions can cause a difference in blood pressure between your right and left arms. A blood pressure reading that is higher than normal on one occasion does not mean that you need treatment. If one blood pressure reading is high, ask your health care provider about having it checked again. BLOOD PRESSURE STAGES Blood pressure is classified into four stages: normal, prehypertension, stage 1, and stage 2. Your blood pressure reading will be used to determine what type of treatment, if any, is necessary. Appropriate treatment options are tied to these four stages:  Normal  Systolic pressure (mm Hg): below 120.  Diastolic pressure (mm Hg): below 80. Prehypertension  Systolic pressure (mm Hg): 120 to 139.  Diastolic pressure (mm Hg): 80 to 89. Stage1  Systolic pressure (mm Hg): 140 to 159.  Diastolic pressure (mm Hg): 90 to 99. Stage2  Systolic pressure (mm Hg): 160 or above.  Diastolic pressure (mm Hg): 100 or above. RISKS RELATED  TO HIGH BLOOD PRESSURE Managing your blood pressure is an important responsibility. Uncontrolled high blood pressure can lead to:  A heart attack.  A stroke.  A weakened blood vessel (aneurysm).  Heart failure.  Kidney damage.  Eye damage.  Metabolic syndrome.  Memory and concentration problems. TREATMENT  Treating high blood pressure includes making lifestyle changes and possibly taking medicine. Living a healthy lifestyle can help lower high blood pressure. You may need to  change some of your habits. Lifestyle changes may include:  Following the DASH diet. This diet is high in fruits, vegetables, and whole grains. It is low in salt, red meat, and added sugars.  Getting at least 2 hours of brisk physical activity every week.  Losing weight if necessary.  Not smoking.  Limiting alcoholic beverages.  Learning ways to reduce stress. If lifestyle changes are not enough to get your blood pressure under control, your health care provider may prescribe medicine. You may need to take more than one. Work closely with your health care provider to understand the risks and benefits. HOME CARE INSTRUCTIONS  Have your blood pressure rechecked as directed by your health care provider.   Take medicines only as directed by your health care provider. Follow the directions carefully. Blood pressure medicines must be taken as prescribed. The medicine does not work as well when you skip doses. Skipping doses also puts you at risk for problems.   Do not smoke.   Monitor your blood pressure at home as directed by your health care provider. SEEK MEDICAL CARE IF:   You think you are having a reaction to medicines taken.  You have recurrent headaches or feel dizzy.  You have swelling in your ankles.  You have trouble with your vision. SEEK IMMEDIATE MEDICAL CARE IF:  You develop a severe headache or confusion.  You have unusual weakness, numbness, or feel faint.  You have  severe chest or abdominal pain.  You vomit repeatedly.  You have trouble breathing. MAKE SURE YOU:   Understand these instructions.  Will watch your condition.  Will get help right away if you are not doing well or get worse. Document Released: 12/13/2005 Document Revised: 04/29/2014 Document Reviewed: 10/05/2013 [facility] Patient Information 2015 [place], [place]. This information is not intended to replace advice given to you by your health care provider. Make sure you discuss any questions you have with your health care provider.     DIET: Eat a healthy low-fat diet, low-salt diet, avoid low junk food and sugar.  I have given a sample diet plan below  Breakfast You may eat 1 of the following  Omelette, which can include a small amount of cheese, and vegetables such as peppers, mushrooms, small pieces of [place] or chicken  Low sugar yogurt serving which can include some fruit such as berries  Egg whites or hard boiled egg and meat (1-2 strips of bacon, or small piece of [place] sausage or [place] bacon)   Mid-morning snack 1 fruit serving such as one of the following:  medium-sized apple  medium-sized orange,  Tangerine  1/2 banana   3/4 cup of fresh berries or frozen berries  A protein source such as one of the following:  8 almonds   small handful of walnuts or other nuts  small piece of cheese,  low sugar yogurt   Lunch A protein source such as 1 of the following:  1 serving of beans such as black beans, pinto beans, green beans, or edamame (soy beans)  1 meat serving such as 6 oz or deck of card size serving of fish, skinless chicken, or [place], either grilled or baked preferably.   You can use some pork or beef, but limit this compared to fish, chicken or [place] Vegetable - Half of your plate should be a non-starchy vegetables!  So avoid white potatoes and corn.  Otherwise, eat a large portion of vegetables.  Avocado, cucumber, tomato, carrots,  greens, lettuce, squash,  okra, etc.   Vegetables can include salad with olive oil/vinaigrette dressing   Mid-afternoon snack 1 fruit serving such as one of the following:  medium-sized apple  medium-sized orange,  Tangerine  1/2 banana   3/4 cup of fresh berries or frozen berries  A protein source such as one of the following:  8 almonds   small handful of walnuts or other nuts  small piece of cheese,  low sugar yogurt   Dinner A protein source such as 1 of the following:  1 serving of beans such as black beans, pinto beans, green beans, or edamame (soy beans)  1 meat serving such as 6 oz or deck of card size serving of fish, skinless chicken, or [place], either grilled or baked preferably.   You can use some pork or beef, but limit this compared to fish, chicken or [place] Vegetable - Half of your plate should be a non-starchy vegetables!  So avoid white potatoes and corn.  Otherwise, eat a large portion of vegetables.  Avocado, cucumber, tomato, carrots, greens, lettuce, squash, okra, etc.   Vegetables can include salad with olive oil/vinaigrette dressing   Beverages: Water Unsweet tea Home made juice with a juicer without sugar added other than small bit of honey or agave nectar Water with sugar free flavor such as [place]   AVOID.... For the time being I want you to cut out the following items completely:  Soda, sweet tea, juice, beer or wine or alcohol  ALL grains and breads including rice, pasta, bread, cereal  Sweets such as cake, candy, pies, chips, cookies, chocolate

## 2019-04-11 ENCOUNTER — Telehealth: Payer: Self-pay | Admitting: Medical

## 2019-04-11 ENCOUNTER — Other Ambulatory Visit: Payer: Self-pay

## 2019-04-11 DIAGNOSIS — I10 Essential (primary) hypertension: Secondary | ICD-10-CM

## 2019-04-11 DIAGNOSIS — E1365 Other specified diabetes mellitus with hyperglycemia: Secondary | ICD-10-CM

## 2019-04-11 MED ORDER — LOSARTAN POTASSIUM-HCTZ 50-12.5 MG PO TABS: 1.0000 | ORAL_TABLET | Freq: Every day | ORAL | 1 refills | Status: DC

## 2019-04-11 MED ORDER — GLYBURIDE-METFORMIN 2.5-500 MG PO TABS: 1.0000 | ORAL_TABLET | Freq: Two times a day (BID) | ORAL | 1 refills | Status: DC

## 2019-04-11 NOTE — Progress Notes (Signed)
Emailed after visit summary the other day left message about about appt

## 2019-04-11 NOTE — Telephone Encounter (Signed)
Pt sister called and is needing these 2 rx sent to another pharmacy the [facility] and LOSARTAN/HCTZ they need to go to the [id number] - [place], [place] - [address] [facility] all sister to let there know this was done [phone]

## 2019-04-11 NOTE — Telephone Encounter (Signed)
[facility] and patient's sister notified.

## 2019-05-23 ENCOUNTER — Ambulatory Visit: Payer: BLUE CROSS/BLUE SHIELD | Admitting: Medical

## 2019-05-28 ENCOUNTER — Telehealth: Payer: Self-pay | Admitting: Medical

## 2019-05-28 NOTE — Telephone Encounter (Signed)
Is he checking sugars, does he have any idea what recent fasting glucose numbers he has?  If > 200 fasting readings, I think he should begin wal brand Relion mix insulin 70/30, where he uses 1 injection morning, 1 in evening, using 5 units twice daily.  This is available OTC without a prescription.   He would need to monitor sugars to make sure he is not getting low readings <70.    Based on the last labs I have in the chart, he probably needs to use this instead of a single pill regimen.   Without insurance, other pil medication will not be affordable other than Metformin.  If he being Relion insulin BID, I would want a call back in 1-2 week on how this is working for him and glucose readings.

## 2019-05-28 NOTE — Telephone Encounter (Signed)
Pt called & states he didn't realize he had a follow up appt last week & apologized he missed it.  He states he can not take the Glyburide because it caused vomiting, he thinks he's allergic to it, so he's not taking anything for his diabetes and would like you to send in something different.  He can't afford to come in right now or do a virtual appt.  He is out of work.  He has insurance but the last virtual appt cost him $150, he thinks he has a high deductible.

## 2019-05-28 NOTE — Telephone Encounter (Signed)
Patient has not been checking sugars since at least a month.   I told him we needed to know his fasting sugar numbers.  I told him recommendations and that he needs to check his sugar for a week or two fasting so we know what to do from there.

## 2019-05-29 ENCOUNTER — Encounter: Payer: Self-pay | Admitting: Medical

## 2019-05-29 NOTE — Telephone Encounter (Signed)
Please mail letter I printed today

## 2019-05-29 NOTE — Telephone Encounter (Signed)
Shane regarding your last note, if you will see original note pt DOES have insurance it's the [facility] plan, so we are not in network, that's why he has to pay out of pocket so much to be seen here and he is out of work.   So as far as his medications you have more flexibility.

## 2019-05-29 NOTE — Telephone Encounter (Signed)
Letter mailed

## 2019-05-30 NOTE — Telephone Encounter (Signed)
I wasn't aware of that, I thought he was self pay.   He can still use same medications but make sure he is aware of the [employer] plan and will need to establish with another provider in network given this situation.

## 2019-06-12 ENCOUNTER — Telehealth: Payer: Self-pay

## 2019-06-12 NOTE — Telephone Encounter (Signed)
Pt called and stated he got a letter in the mail from the office. He stated he has started Relion as prescribed to him and everything is going well. He just wanted to call and give an update and let us know he got the letter.

## 2019-06-13 NOTE — Telephone Encounter (Signed)
Thanks for the update.  Make sure he establishes with a [facility] doctor given his insurance change so they can manage his diabetes.

## 2019-09-25 ENCOUNTER — Other Ambulatory Visit: Payer: Self-pay | Admitting: Obstetrics and Gynecology

## 2019-09-25 ENCOUNTER — Other Ambulatory Visit: Payer: Self-pay

## 2019-09-25 ENCOUNTER — Encounter (HOSPITAL_COMMUNITY): Payer: Self-pay | Admitting: Emergency Medicine

## 2019-09-25 ENCOUNTER — Emergency Department (HOSPITAL_COMMUNITY)
Admission: EM | Admit: 2019-09-25 | Discharge: 2019-09-25 | Discharge status: Against Medical Advice | Payer: BLUE CROSS/BLUE SHIELD | Attending: Emergency Medicine | Admitting: Emergency Medicine

## 2019-09-25 DIAGNOSIS — Z5321 Procedure and treatment not carried out due to patient leaving prior to being seen by health care provider: Secondary | ICD-10-CM | POA: Diagnosis not present

## 2019-09-25 DIAGNOSIS — I10 Essential (primary) hypertension: Secondary | ICD-10-CM | POA: Diagnosis not present

## 2019-09-25 DIAGNOSIS — W0110XA Fall on same level from slipping, tripping and stumbling with subsequent striking against unspecified object, initial encounter: Secondary | ICD-10-CM

## 2019-09-25 NOTE — ED Triage Notes (Signed)
Pt states that he was at [facility] and was told that his BP was high despite having taken his losartan as prescribed. States they gave him BP meds before being referred to come here. BP now 108/89. Alert and oriented. Denies CP/SOB/ HA.

## 2019-10-07 ENCOUNTER — Other Ambulatory Visit: Payer: Self-pay | Admitting: Medical

## 2019-10-07 DIAGNOSIS — I10 Essential (primary) hypertension: Secondary | ICD-10-CM

## 2019-11-03 ENCOUNTER — Other Ambulatory Visit: Payer: Self-pay | Admitting: Medical

## 2019-11-03 DIAGNOSIS — I10 Essential (primary) hypertension: Secondary | ICD-10-CM

## 2019-12-03 ENCOUNTER — Other Ambulatory Visit: Payer: Self-pay | Admitting: Medical

## 2019-12-03 DIAGNOSIS — I10 Essential (primary) hypertension: Secondary | ICD-10-CM

## 2019-12-12 ENCOUNTER — Telehealth: Payer: Self-pay | Admitting: Medical

## 2019-12-12 ENCOUNTER — Other Ambulatory Visit: Payer: Self-pay | Admitting: Medical

## 2019-12-12 DIAGNOSIS — I10 Essential (primary) hypertension: Secondary | ICD-10-CM

## 2019-12-12 MED ORDER — LOSARTAN POTASSIUM-HCTZ 50-12.5 MG PO TABS: 1.0000 | ORAL_TABLET | Freq: Every day | ORAL | 0 refills | Status: DC

## 2019-12-12 NOTE — Telephone Encounter (Signed)
I sent a 30-day supply.  He is way past due for follow-up.  Please get him in NOW for follow-up fasting  No further refills until appointment

## 2019-12-12 NOTE — Telephone Encounter (Signed)
Pt needs refill on Losarten sent to the [facility] on [address]

## 2019-12-13 ENCOUNTER — Other Ambulatory Visit: Payer: Self-pay | Admitting: Medical

## 2019-12-13 DIAGNOSIS — I10 Essential (primary) hypertension: Secondary | ICD-10-CM

## 2019-12-13 NOTE — Telephone Encounter (Signed)
Pt advised and stated that he will call back and schedule appt.

## 2020-01-23 ENCOUNTER — Encounter (HOSPITAL_COMMUNITY): Payer: Self-pay

## 2020-01-23 ENCOUNTER — Other Ambulatory Visit: Payer: Self-pay

## 2020-01-23 ENCOUNTER — Ambulatory Visit (HOSPITAL_COMMUNITY)
Admission: EM | Admit: 2020-01-23 | Discharge: 2020-01-23 | Disposition: A | Discharge status: Home / Self Care | Payer: BLUE CROSS/BLUE SHIELD | Attending: Family Medicine | Admitting: Family Medicine

## 2020-01-23 DIAGNOSIS — R0989 Other specified symptoms and signs involving the circulatory and respiratory systems: Secondary | ICD-10-CM | POA: Insufficient documentation

## 2020-01-23 DIAGNOSIS — Z880 Allergy status to penicillin: Secondary | ICD-10-CM | POA: Diagnosis not present

## 2020-01-23 DIAGNOSIS — Z20822 Contact with and (suspected) exposure to covid-19: Secondary | ICD-10-CM | POA: Insufficient documentation

## 2020-01-23 DIAGNOSIS — R05 Cough: Secondary | ICD-10-CM | POA: Diagnosis present

## 2020-01-23 DIAGNOSIS — Z8249 Family history of ischemic heart disease and other diseases of the circulatory system: Secondary | ICD-10-CM | POA: Insufficient documentation

## 2020-01-23 DIAGNOSIS — R067 Sneezing: Secondary | ICD-10-CM | POA: Insufficient documentation

## 2020-01-23 DIAGNOSIS — I10 Essential (primary) hypertension: Secondary | ICD-10-CM | POA: Diagnosis not present

## 2020-01-23 DIAGNOSIS — R059 Cough, unspecified: Secondary | ICD-10-CM

## 2020-01-23 DIAGNOSIS — F17210 Nicotine dependence, cigarettes, uncomplicated: Secondary | ICD-10-CM | POA: Diagnosis not present

## 2020-01-23 DIAGNOSIS — Z825 Family history of asthma and other chronic lower respiratory diseases: Secondary | ICD-10-CM | POA: Diagnosis not present

## 2020-01-23 MED ORDER — HYDROCODONE-HOMATROPINE 5-1.5 MG/5ML PO SYRP: 5.0000 mL | ORAL_SOLUTION | Freq: Four times a day (QID) | ORAL | 0 refills | Status: DC | PRN

## 2020-01-23 MED ORDER — LOSARTAN POTASSIUM-HCTZ 50-12.5 MG PO TABS: 1.0000 | ORAL_TABLET | Freq: Every day | ORAL | 1 refills | Status: DC

## 2020-01-23 NOTE — ED Triage Notes (Addendum)
Pt states he has a cough , runny nose and sneezing x 1 week. Pt states he has been using Nyquil. Pt states sit works sometimes. Pt states he's out of B/P meds as well

## 2020-01-23 NOTE — ED Provider Notes (Signed)
[facility]   [id number] 01/23/20 Arrival Time: 4627  ASSESSMENT & PLAN:  1. Cough   2. Runny nose   3. Sneezing   4. Elevated blood pressure reading with diagnosis of hypertension     COVID-19 testing sent. See letter/work note on file for self-isolation guidelines. No s/s of hypertensive emergency.  Meds ordered this encounter  Medications  . losartan-hydrochlorothiazide (HYZAAR) 50-12.5 MG tablet    Sig: Take 1 tablet by mouth daily.    Dispense:  30 tablet    Refill:  1    Needs an appointment for future refills  . HYDROcodone-homatropine (HYCODAN) 5-1.5 MG/5ML syrup    Sig: Take 5 mLs by mouth every 6 (six) hours as needed for cough.    Dispense:  90 mL    Refill:  0      Discharge Instructions     You have been tested for COVID-19 today. If your test returns positive, you will receive a phone call from [facility] regarding your results. Negative test results are not called. Both positive and negative results area always visible on MyChart. If you do not have a MyChart account, sign up instructions are provided in your discharge papers. Please do not hesitate to contact us should you have questions or concerns.   Your blood pressure was noted to be elevated during your visit today. You may return here within the next few days to recheck if unable to see your primary care doctor. Begin taking your medications again  BP (!) 182/99 (BP Location: Right Arm)   Pulse 66   Temp 98.9 F (37.2 C) (Oral)   Resp 18   Wt 90.7 kg   SpO2 98%   BMI 25.68 kg/m   Be aware, the cough medication prescribed may cause drowsiness. Please do not drive, operate heavy machinery or make important decisions while on this medication, it can cloud your judgement.      OTC symptom care as needed. [place] Controlled Substances Registry consulted for this patient. I feel the risk/benefit ratio today is favorable for proceeding with this prescription for a controlled substance.  Medication sedation precautions given.  Reviewed expectations re: course of current medical issues. Questions answered. Outlined signs and symptoms indicating need for more acute intervention. Patient verbalized understanding. After Visit Summary given.   SUBJECTIVE: History from: patient. Rodney Rose is a 44 y.o. male who reports runny nose, dry nighttime cough, and sneezing. Gradual onset; one week; no worsening. Mild fatigue. Afebrile. No reflux symptoms. Mild body aches. Known COVID-19 contact: none. Recent travel: none. Denies: difficulty breathing and headache. Normal PO intake without n/v/d.  Increased blood pressure noted today. Reports that he has been treated for hypertension in the past. He reports not taking medications regularly as instructed (reports being out of medications), no chest pain on exertion, no dyspnea on exertion, no swelling of ankles, no orthostatic dizziness or lightheadedness, no orthopnea or paroxysmal nocturnal dyspnea, no palpitations and no intermittent claudication symptoms.    OBJECTIVE:  Vitals:   01/23/20 1020 01/23/20 1022  BP:  (!) 182/99  Pulse:  66  Resp:  18  Temp:  98.9 F (37.2 C)  TempSrc:  Oral  SpO2:  98%  Weight: 90.7 kg     General appearance: alert; no distress Eyes: PERRLA; EOMI; conjunctiva normal HENT: ; AT; nasal mucosa normal; oral mucosa normal Neck: supple  Lungs: speaks full sentences without difficulty; unlabored Extremities: no edema Skin: warm and dry Neurologic: normal gait Psychological:  alert and cooperative; normal mood and affect  Labs:  Labs Reviewed  NOVEL CORONAVIRUS, NAA (HOSP ORDER, SEND-OUT TO REF LAB; TAT 18-24 HRS)     Allergies  Allergen Reactions  . Penicillins Swelling    Past Medical History:  Diagnosis Date  . Bipolar disorder (HCC)    Dr. Georgia Lopes with [facility]  . Chronic back pain   . Hypertension 2017  . Schizophrenia [facility])    Social History   Socioeconomic  History  . Marital status: Single    Spouse name: Not on file  . Number of children: Not on file  . Years of education: Not on file  . Highest education level: Not on file  Occupational History  . Not on file  Tobacco Use  . Smoking status: Current Every Day Smoker    Packs/day: 1.00    Years: 6.00    Pack years: 6.00    Types: Cigarettes  . Smokeless tobacco: Never Used  Substance and Sexual Activity  . Alcohol use: No  . Drug use: No  . Sexual activity: Yes    Birth control/protection: Condom  Other Topics Concern  . Not on file  Social History Narrative   Lives with mother and sister.   In workers comp coverage currently.   10/2018.   Social Determinants of Health   Financial Resource Strain:   . Difficulty of Paying Living Expenses: Not on file  Food Insecurity:   . Worried About Programme researcher, broadcasting/film/video in the Last Year: Not on file  . Ran Out of Food in the Last Year: Not on file  Transportation Needs:   . Lack of Transportation (Medical): Not on file  . Lack of Transportation (Non-Medical): Not on file  Physical Activity:   . Days of Exercise per Week: Not on file  . Minutes of Exercise per Session: Not on file  Stress:   . Feeling of Stress : Not on file  Social Connections:   . Frequency of Communication with Friends and Family: Not on file  . Frequency of Social Gatherings with Friends and Family: Not on file  . Attends Religious Services: Not on file  . Active Member of Clubs or Organizations: Not on file  . Attends Banker Meetings: Not on file  . Marital Status: Not on file  Intimate Partner Violence:   . Fear of Current or Ex-Partner: Not on file  . Emotionally Abused: Not on file  . Physically Abused: Not on file  . Sexually Abused: Not on file   Family History  Problem Relation Age of Onset  . Hypertension Mother   . COPD Father   . Asthma Sister   . Heart disease Neg Hx   . Stroke Neg Hx   . Diabetes Neg Hx    Past Surgical  History:  Procedure Laterality Date  . NO PAST SURGERIES  10/2018     Mardella Layman, MD 01/23/20 1052

## 2020-01-23 NOTE — Discharge Instructions (Addendum)
You have been tested for COVID-19 today. If your test returns positive, you will receive a phone call from [facility] regarding your results. Negative test results are not called. Both positive and negative results area always visible on MyChart. If you do not have a MyChart account, sign up instructions are provided in your discharge papers. Please do not hesitate to contact us should you have questions or concerns.   Your blood pressure was noted to be elevated during your visit today. You may return here within the next few days to recheck if unable to see your primary care doctor. Begin taking your medications again  BP (!) 182/99 (BP Location: Right Arm)   Pulse 66   Temp 98.9 F (37.2 C) (Oral)   Resp 18   Wt 90.7 kg   SpO2 98%   BMI 25.68 kg/m   Be aware, the cough medication prescribed may cause drowsiness. Please do not drive, operate heavy machinery or make important decisions while on this medication, it can cloud your judgement.

## 2020-01-25 LAB — NOVEL CORONAVIRUS, NAA (HOSP ORDER, SEND-OUT TO REF LAB; TAT 18-24 HRS): SARS-CoV-2, NAA: NOT DETECTED

## 2020-03-20 ENCOUNTER — Telehealth: Payer: Self-pay | Admitting: Family Medicine

## 2020-03-20 NOTE — Telephone Encounter (Signed)
Called pt advised we need to schedule diabetes care.  He said he would call me back after he sees what is good for him.  I explained we needed to see him to be able to care for him.

## 2020-03-24 ENCOUNTER — Ambulatory Visit (HOSPITAL_COMMUNITY)
Admission: EM | Admit: 2020-03-24 | Discharge: 2020-03-24 | Discharge status: Absent without leave | Payer: BLUE CROSS/BLUE SHIELD

## 2020-03-24 ENCOUNTER — Other Ambulatory Visit: Payer: Self-pay

## 2020-03-29 ENCOUNTER — Ambulatory Visit (HOSPITAL_COMMUNITY)
Admission: EM | Admit: 2020-03-29 | Discharge: 2020-03-29 | Disposition: A | Discharge status: Home / Self Care | Payer: BLUE CROSS/BLUE SHIELD | Attending: Family Medicine | Admitting: Family Medicine

## 2020-03-29 ENCOUNTER — Other Ambulatory Visit: Payer: Self-pay

## 2020-03-29 ENCOUNTER — Encounter (HOSPITAL_COMMUNITY): Payer: Self-pay

## 2020-03-29 DIAGNOSIS — I10 Essential (primary) hypertension: Secondary | ICD-10-CM

## 2020-03-29 DIAGNOSIS — I160 Hypertensive urgency: Secondary | ICD-10-CM

## 2020-03-29 DIAGNOSIS — E1365 Other specified diabetes mellitus with hyperglycemia: Secondary | ICD-10-CM | POA: Diagnosis not present

## 2020-03-29 DIAGNOSIS — R739 Hyperglycemia, unspecified: Secondary | ICD-10-CM

## 2020-03-29 LAB — GLUCOSE, CAPILLARY: Glucose-Capillary: 116 mg/dL — ABNORMAL HIGH (ref 70–99)

## 2020-03-29 LAB — CBG MONITORING, ED: Glucose-Capillary: 116 mg/dL — ABNORMAL HIGH (ref 70–99)

## 2020-03-29 MED ORDER — GLIPIZIDE-METFORMIN HCL 5-500 MG PO TABS: 2.0000 | ORAL_TABLET | Freq: Two times a day (BID) | ORAL | 1 refills | Status: DC

## 2020-03-29 MED ORDER — AMLODIPINE BESYLATE 5 MG PO TABS: 5.0000 mg | ORAL_TABLET | Freq: Every day | ORAL | 0 refills | Status: DC

## 2020-03-29 MED ORDER — LOSARTAN POTASSIUM-HCTZ 50-12.5 MG PO TABS: 1.0000 | ORAL_TABLET | Freq: Every day | ORAL | 0 refills | Status: DC

## 2020-03-29 NOTE — ED Triage Notes (Signed)
Pt presents for check of blood pressure and blood sugar levels.  Pt is non compliant with both medication and monitoring blood pressure & blood sugar at home; Pt does not follow up with primary care.  Pt states he has not taken blood pressure medication in a few days, he does not monitor blood pressure or blood sugar at home and he changed the dose of insulin he was taking because he was not sure if it was making him nauseas.

## 2020-03-29 NOTE — ED Provider Notes (Signed)
[facility]    CSN: [id number] Arrival date & time: 03/29/20  1425      History   Chief Complaint Chief Complaint  Patient presents with  . Hypertension  . Elevated Blood Sugar    HPI Rodney Rose is a 44 y.o. male.   Patient reports that he feels like he is having issues with blood pressure and his blood sugar.  Patient has significant history for hypertension and diabetes.  Per chart review, patient has history significant for bipolar, schizophrenia, he is a current daily smoker, uncontrolled diabetes, renal insufficiency, hyperlipidemia.  Patient states that he does not have a way to check his blood pressure at home, nor does he check his sugar at home.  Patient states that he is having financial difficulty, and that he cannot get to his primary care physician to have labs and have his medications refilled.  Upon chart review, there were notes where he had called his primary care for refills, but they stated they needed to see him in order to fill these medications.  There is also a message that you would call them back, but no communication was noted after that.  Patient denies headache, sore throat, shortness of breath, chest pain, chest tightness, changes in vision, tingling and numbness to extremities, nausea, vomiting, diarrhea, chills, body aches, rash, fever, other symptoms.  ROS per HPI  The history is provided by the patient.    Past Medical History:  Diagnosis Date  . Bipolar disorder ([place])    Dr. Heriberto Antigua with [facility]  . Chronic back pain   . Hypertension 2017  . Schizophrenia [facility])     Patient Active Problem List   Diagnosis Date Noted  . Renal insufficiency 04/09/2019  . Right hip pain 11/15/2018  . Tachycardia 11/15/2018  . Calf swelling 11/15/2018  . Elevated blood sugar 11/03/2018  . Diabetes mellitus, new onset ([place]) 11/03/2018  . Uncontrolled diabetes mellitus ([place]) 11/03/2018  . Mixed dyslipidemia 11/03/2018  . Abnormal EKG  11/03/2018  . Essential hypertension, benign 11/02/2018  . Smoker 11/02/2018  . Bipolar disorder ([place]) 11/02/2018  . Edema 11/02/2018    Past Surgical History:  Procedure Laterality Date  . NO PAST SURGERIES  10/2018       Home Medications    Prior to Admission medications   Medication Sig Start Date End Date Taking? Authorizing Provider  amLODipine (NORVASC) 5 MG tablet Take 1 tablet (5 mg total) by mouth daily. 03/29/20   Faustino Congress, NP  glipiZIDE-metformin (METAGLIP) 5-500 MG tablet Take 2 tablets by mouth 2 (two) times daily before a meal. 03/29/20 04/28/20  Faustino Congress, NP  haloperidol (HALDOL) 5 MG tablet Take 5 mg by mouth at bedtime.    [provider]  HYDROcodone-homatropine (HYCODAN) 5-1.5 MG/5ML syrup Take 5 mLs by mouth every 6 (six) hours as needed for cough. 01/23/20   Vanessa Kick, MD  ibuprofen (ADVIL,MOTRIN) 800 MG tablet Take 1 tablet (800 mg total) by mouth 3 (three) times daily. 03/20/15   Charlann Lange, PA-C  Insulin Glargine, 2 Unit Dial, (TOUJEO MAX SOLOSTAR) 300 UNIT/ML SOPN Inject 20 Units into the skin at bedtime for 30 days. 02/14/19 04/09/19  Carmin Muskrat, MD  Insulin Pen Needle (BD PEN NEEDLE NANO U/F) 32G X 4 MM MISC 1 each by Does not apply route at bedtime. 02/14/19   Carmin Muskrat, MD  loperamide (IMODIUM) 2 MG capsule Take 1 capsule (2 mg total) by mouth daily as needed for diarrhea or  loose stools. 02/12/19   Wallis Bamberg, PA-C  losartan-hydrochlorothiazide (HYZAAR) 50-12.5 MG tablet Take 1 tablet by mouth daily. 03/29/20 04/28/20  Moshe Cipro, NP  ondansetron (ZOFRAN-ODT) 8 MG disintegrating tablet Take 1 tablet (8 mg total) by mouth every 8 (eight) hours as needed for nausea or vomiting. 02/12/19   Wallis Bamberg, PA-C  Azilsartan-Chlorthalidone 40-12.5 MG TABS Take 1 tablet by mouth daily. Patient not taking: Reported on 04/09/2019 11/02/18 01/23/20  Tysinger, Kermit Balo, PA-C  glyBURIDE-metformin (GLUCOVANCE) 2.5-500 MG tablet  Take 1 tablet by mouth 2 (two) times daily with a meal. 04/11/19 03/29/20  Tysinger, Kermit Balo, PA-C    Family History Family History  Problem Relation Age of Onset  . Hypertension Mother   . COPD Father   . Asthma Sister   . Heart disease Neg Hx   . Stroke Neg Hx   . Diabetes Neg Hx     Social History Social History   Tobacco Use  . Smoking status: Current Every Day Smoker    Packs/day: 1.00    Years: 6.00    Pack years: 6.00    Types: Cigarettes  . Smokeless tobacco: Never Used  Substance Use Topics  . Alcohol use: No  . Drug use: No     Allergies   Penicillins   Review of Systems Review of Systems   Physical Exam Triage Vital Signs ED Triage Vitals  Enc Vitals Group     BP 03/29/20 1515 (!) 217/114     Pulse Rate 03/29/20 1515 100     Resp 03/29/20 1515 17     Temp 03/29/20 1515 98.5 F (36.9 C)     Temp Source 03/29/20 1515 Oral     SpO2 03/29/20 1515 99 %     Weight --      Height --      Head Circumference --      Peak Flow --      Pain Score 03/29/20 1519 0     Pain Loc --      Pain Edu? --      Excl. in GC? --    No data found.  Updated Vital Signs BP (!) 217/114 (BP Location: Left Arm) Comment: has not taken blood pressure medication in a few days  Pulse 100   Temp 98.5 F (36.9 C) (Oral)   Resp 17   SpO2 99%   Visual Acuity Right Eye Distance:   Left Eye Distance:   Bilateral Distance:    Right Eye Near:   Left Eye Near:    Bilateral Near:     Physical Exam Vitals and nursing note reviewed.  Constitutional:      General: He is not in acute distress.    Appearance: Normal appearance. He is well-developed and normal weight. He is not ill-appearing or toxic-appearing.  HENT:     Head: Normocephalic and atraumatic.     Nose: Nose normal.     Mouth/Throat:     Mouth: Mucous membranes are moist.     Pharynx: Oropharynx is clear.  Eyes:     Extraocular Movements: Extraocular movements intact.     Conjunctiva/sclera: Conjunctivae  normal.     Pupils: Pupils are equal, round, and reactive to light.  Cardiovascular:     Rate and Rhythm: Normal rate and regular rhythm.     Heart sounds: Normal heart sounds. No murmur.  Pulmonary:     Effort: Pulmonary effort is normal. No respiratory distress.     Breath sounds:  Normal breath sounds. No stridor. No wheezing, rhonchi or rales.  Chest:     Chest wall: No tenderness.  Abdominal:     General: Bowel sounds are normal. There is no distension.     Palpations: Abdomen is soft. There is no mass.     Tenderness: There is no abdominal tenderness. There is no right CVA tenderness, left CVA tenderness, guarding or rebound.     Hernia: No hernia is present.  Musculoskeletal:        General: Normal range of motion.     Cervical back: Normal range of motion and neck supple.  Skin:    General: Skin is warm and dry.     Capillary Refill: Capillary refill takes less than 2 seconds.  Neurological:     General: No focal deficit present.     Mental Status: He is alert and oriented to person, place, and time.  Psychiatric:        Mood and Affect: Mood normal.        Behavior: Behavior normal.        Thought Content: Thought content normal.      UC Treatments / Results  Labs (all labs ordered are listed, but only abnormal results are displayed) Labs Reviewed  GLUCOSE, CAPILLARY - Abnormal; Notable for the following components:      Result Value   Glucose-Capillary 116 (*)    All other components within normal limits  CBG MONITORING, ED - Abnormal; Notable for the following components:   Glucose-Capillary 116 (*)    All other components within normal limits    EKG   Radiology No results found.  Procedures Procedures (including critical care time)  Medications Ordered in UC Medications - No data to display  Initial Impression / Assessment and Plan / UC Course  I have reviewed the triage vital signs and the nursing notes.  Pertinent labs & imaging results that  were available during my care of the patient were reviewed by me and considered in my medical decision making (see chart for details).     Essential hypertension/hypertensive urgency: Patient's blood pressure in the office today is 217/114.  Has not had medications in at least a week.  Patient inquiring whether we can just "tweak his medications".  Discussed with patient the need for labs, urinalysis, and his increased risk of stroke given his medical history.  Patient declines labs in office today, and refuses to go to the ER.  Strict ER precautions given, if there is any headache, any change in vision, if he is just feeling poorly he needs to go to the ER.  Discussed his risk of DKA as well.  Really enforced that he needs to get back in with his primary care provider.  Refilled losartan-HCTZ 50-12.5 mg tablets by mouth once daily.  Discussed with patient that this is likely not going to control his blood pressure given how high it is in the office today.  Also prescribed amlodipine 5 mg to take daily.  Hyperglycemia: Patient's blood sugar in office today was 116.  While this 1 reading is okay, he reports that he has not been using his medications because he is out of them, or does not like the way they make him feel.  Has Toujeo prescribed, but is an expired prescription.  Patient declines refill on this, states that he does not like the way it makes him feel so he is not going to take it.  Did send in glipizide-Metformin 5-500 mg  tablets for him to take twice daily by mouth.  His last A1c upon lab review was 11.1.  Discussed that A1c at this level could affect his heart, kidneys, eyes, blood vessels.  In-depth education provided on the importance of maintaining glucose control.  Patient verbalized understanding and agrees to treatment plan. Final Clinical Impressions(s) / UC Diagnoses   Final diagnoses:  Essential hypertension  Hypertensive urgency  Hyperglycemia     Discharge Instructions      Refilled blood pressure medications. Also sent in amlodipine 5mg  to add to your medication.   Provided information for and Wellness.  Make sure that you are taking your medications as prescribed.   Go to the ER for severe headache and any changes in vision or loss of consciousness, other concerning symptoms.     ED Prescriptions    Medication Sig Dispense Auth. Provider   losartan-hydrochlorothiazide (HYZAAR) 50-12.5 MG tablet Take 1 tablet by mouth daily. 30 tablet [employer], NP   amLODipine (NORVASC) 5 MG tablet Take 1 tablet (5 mg total) by mouth daily. 30 tablet Moshe Cipro, NP   glipiZIDE-metformin (METAGLIP) 5-500 MG tablet Take 2 tablets by mouth 2 (two) times daily before a meal. 60 tablet Moshe Cipro, NP     PDMP not reviewed this encounter.   Moshe Cipro, NP 04/01/20 [phone]

## 2020-03-29 NOTE — Discharge Instructions (Signed)
Refilled blood pressure medications. Also sent in amlodipine 5mg  to add to your medication.   Provided information for and Wellness.  Make sure that you are taking your medications as prescribed.   Go to the ER for severe headache and any changes in vision or loss of consciousness, other concerning symptoms.

## 2020-07-18 ENCOUNTER — Ambulatory Visit (HOSPITAL_COMMUNITY)
Admission: EM | Admit: 2020-07-18 | Discharge: 2020-07-18 | Disposition: A | Discharge status: Home / Self Care | Payer: BLUE CROSS/BLUE SHIELD | Attending: Psychiatry | Admitting: Psychiatry

## 2020-07-18 ENCOUNTER — Other Ambulatory Visit: Payer: Self-pay

## 2020-07-18 DIAGNOSIS — I10 Essential (primary) hypertension: Secondary | ICD-10-CM

## 2020-07-18 DIAGNOSIS — Z760 Encounter for issue of repeat prescription: Secondary | ICD-10-CM

## 2020-07-18 MED ORDER — HALOPERIDOL 5 MG PO TABS: 5.0000 mg | ORAL_TABLET | Freq: Every day | ORAL | 0 refills | Status: DC

## 2020-07-18 MED ORDER — AMLODIPINE BESYLATE 5 MG PO TABS: 5.0000 mg | ORAL_TABLET | Freq: Every day | ORAL | 0 refills | Status: DC

## 2020-07-18 MED ORDER — LOSARTAN POTASSIUM-HCTZ 50-12.5 MG PO TABS: 1.0000 | ORAL_TABLET | Freq: Every day | ORAL | 0 refills | Status: DC

## 2020-07-18 MED FILL — LOSARTAN-HCTZ 50-12.5 MG TA: 50-12.5 | 30 days supply | Qty: 30 | Fill #0

## 2020-07-18 MED FILL — AMLODIPINE BESYLATE 5 MG TA: 5 | 30 days supply | Qty: 30 | Fill #0

## 2020-07-18 MED FILL — HALOPERIDOL 5 MG TABS: 5 | 30 days supply | Qty: 30 | Fill #0

## 2020-07-18 NOTE — Progress Notes (Signed)
Rodney Rose received his AVS, his questions were answered and he received his prescriptions. He was given information to the [employer] [facility] to pick up his medications. He received a bus pass.

## 2020-07-18 NOTE — ED Provider Notes (Signed)
Behavioral Health Medical Screening Exam  Rodney Rose is a 44 y.o. male.  Who presented accidentally to the behavioral health urgent care.  Patient states that he came to make an appointment for outpatient services and was referred here by [facility].  Patient has that he did not know this was a behavioral health urgent care, reports that he has been on psychotropic medication, wanted to have outpatient services and open access so he could get his prescriptions filled.  Patient denies any thoughts of hurting himself, others, any psychotic symptoms, any side effects with his medications.  He does report that he was getting medications through the indigent pharmacy at [place], does not have any prescriptions.  Patient's blood pressure was elevated, prescriptions were done for his Haldol, his blood pressure medications. Total Time spent with patient: 30 minutes   Medications No current facility-administered medications on file prior to encounter.   Current Outpatient Medications on File Prior to Encounter  Medication Sig Dispense Refill  . amLODipine (NORVASC) 5 MG tablet Take 1 tablet (5 mg total) by mouth daily. 30 tablet 0  . glipiZIDE-metformin (METAGLIP) 5-500 MG tablet Take 2 tablets by mouth 2 (two) times daily before a meal. 60 tablet 1  . haloperidol (HALDOL) 5 MG tablet Take 5 mg by mouth at bedtime.    [name] HYDROcodone-homatropine (HYCODAN) 5-1.5 MG/5ML syrup Take 5 mLs by mouth every 6 (six) hours as needed for cough. 90 mL 0  . ibuprofen (ADVIL,MOTRIN) 800 MG tablet Take 1 tablet (800 mg total) by mouth 3 (three) times daily. 21 tablet 0  . Insulin Glargine, 2 Unit Dial, (TOUJEO MAX SOLOSTAR) 300 UNIT/ML SOPN Inject 20 Units into the skin at bedtime for 30 days. 3 mL 0  . Insulin Pen Needle (BD PEN NEEDLE NANO U/F) 32G X 4 MM MISC 1 each by Does not apply route at bedtime. 30 each 0  . loperamide (IMODIUM) 2 MG capsule Take 1 capsule (2 mg total) by mouth daily as needed for diarrhea or  loose stools. 10 capsule 0  . losartan-hydrochlorothiazide (HYZAAR) 50-12.5 MG tablet Take 1 tablet by mouth daily. 30 tablet 0  . ondansetron (ZOFRAN-ODT) 8 MG disintegrating tablet Take 1 tablet (8 mg total) by mouth every 8 (eight) hours as needed for nausea or vomiting. 20 tablet 0  . [DISCONTINUED] Azilsartan-Chlorthalidone 40-12.5 MG TABS Take 1 tablet by mouth daily. (Patient not taking: Reported on 04/09/2019) 30 tablet 1  . [DISCONTINUED] glyBURIDE-metformin (GLUCOVANCE) 2.5-500 MG tablet Take 1 tablet by mouth 2 (two) times daily with a meal. 180 tablet 1    Psychiatric Specialty Exam  Presentation  General Appearance:Appropriate for Environment;Casual  Eye Contact:Good  Speech:Clear and Coherent  Speech Volume:Normal  Handedness:No data recorded  Mood and Affect  Mood:Euthymic  Affect:Appropriate;Congruent   Thought Process  Thought Processes:Coherent  Descriptions of Associations:Intact  Orientation:No data recorded Thought Content:Abstract Reasoning;Logical  Hallucinations:None  Ideas of Reference:No data recorded Suicidal Thoughts:No  Homicidal Thoughts:No   Sensorium  Memory:Immediate Fair;Recent Fair;Remote Fair  Judgment:Fair  Insight:Fair   Executive Functions  Concentration:No data recorded Attention Span:Fair  Recall:Fair  Fund of Knowledge:Fair  Language:Fair   Psychomotor Activity  Psychomotor Activity:Normal   Assets  Assets:Communication Skills;Desire for Improvement;Social Support   Sleep  Sleep:Fair  Number of hours: No data recorded  Physical Exam: Physical Exam Vitals reviewed.  Constitutional:      Appearance: Normal appearance.  HENT:     Head: Normocephalic.     Nose: Nose normal.  Mouth/Throat:     Mouth: Mucous membranes are dry.  Eyes:     Extraocular Movements: Extraocular movements intact.     Pupils: Pupils are equal, round, and reactive to light.  Cardiovascular:     Rate and Rhythm: Normal  rate.     Pulses: Normal pulses.     Heart sounds: Normal heart sounds.  Pulmonary:     Effort: Pulmonary effort is normal.     Breath sounds: Normal breath sounds.  Musculoskeletal:        General: Normal range of motion.     Cervical back: Normal range of motion.  Skin:    General: Skin is warm.  Neurological:     General: No focal deficit present.     Mental Status: He is alert and oriented to person, place, and time.  Psychiatric:        Mood and Affect: Mood normal.    ROS Blood pressure (!) 210/120, pulse 100, temperature 98.3 F (36.8 C), temperature source Oral, resp. rate 18, height 6\' 2"  (1.88 m), weight (!) 99.8 kg, SpO2 100 %. Body mass index is 28.25 kg/m.  Musculoskeletal: Strength & Muscle Tone: within normal limits Gait & Station: normal Patient leans: N/A   Recommendations:  Based on my evaluation the patient does not appear to have an emergency medical condition. Patient's Norvasc, Haldol and Hyzaar was renewed for 30 days with setting up an outpatient appointment on the 2nd floor of [facility] , MD 07/18/2020, 1:31 PM

## 2020-08-19 ENCOUNTER — Other Ambulatory Visit (HOSPITAL_COMMUNITY): Payer: Self-pay | Admitting: Psychiatry

## 2020-08-19 DIAGNOSIS — I10 Essential (primary) hypertension: Secondary | ICD-10-CM

## 2020-08-25 ENCOUNTER — Other Ambulatory Visit: Payer: Self-pay | Admitting: Medical

## 2020-08-25 DIAGNOSIS — I10 Essential (primary) hypertension: Secondary | ICD-10-CM

## 2020-08-26 NOTE — Telephone Encounter (Signed)
This should be an obvious no refill. He has not been here in greater than 1 year. He has had recent several emergency department visits for blood pressure.  Thus he is due for fasting physical and follow-up on blood pressure follow-up on recent ED visits.  If the emergency department gave him a short-term supply we could feel up to 30 days only to get him in for appointment  The goal is not to run out of the medicine but to have yearly follow-up and not miss visits

## 2020-08-26 NOTE — Telephone Encounter (Signed)
Ok to refill 

## 2020-08-27 NOTE — Telephone Encounter (Signed)
Called pt and advised him that he needs an appt and that hes hasn't been in.pt declined to schedule and said he will call back. He is completely out of meds. I advised him we could onl refill for 30 days and then if he didn't come in then he wouldn't get anymore refills. He said he would call back mid to end of month to schedule.  Ok to refill for 30 days

## 2020-08-28 MED FILL — LOSARTAN-HCTZ 50-12.5 MG TA: 50-12.5 | 30 days supply | Qty: 30 | Fill #0

## 2020-09-23 ENCOUNTER — Other Ambulatory Visit: Payer: Self-pay | Admitting: Medical

## 2020-09-23 DIAGNOSIS — I10 Essential (primary) hypertension: Secondary | ICD-10-CM

## 2020-09-23 NOTE — Telephone Encounter (Signed)
Pt has not been in in over a year and half and bp med keeps getting refilled. I called and spoke to patient and advised him that he needed an appointment and he states he can not come in until later on this year but could not schedule an appointment at this time. Is this okay to deny rx.

## 2020-09-28 ENCOUNTER — Ambulatory Visit (HOSPITAL_COMMUNITY)
Admission: EM | Admit: 2020-09-28 | Discharge: 2020-09-28 | Disposition: A | Discharge status: Home / Self Care | Payer: BLUE CROSS/BLUE SHIELD | Attending: Family Medicine | Admitting: Family Medicine

## 2020-09-28 ENCOUNTER — Encounter (HOSPITAL_COMMUNITY): Payer: Self-pay | Admitting: Emergency Medicine

## 2020-09-28 ENCOUNTER — Other Ambulatory Visit: Payer: Self-pay

## 2020-09-28 DIAGNOSIS — Z760 Encounter for issue of repeat prescription: Secondary | ICD-10-CM

## 2020-09-28 DIAGNOSIS — I10 Essential (primary) hypertension: Secondary | ICD-10-CM

## 2020-09-28 MED ORDER — LOSARTAN POTASSIUM-HCTZ 50-12.5 MG PO TABS: 1.0000 | ORAL_TABLET | Freq: Every day | ORAL | 0 refills | Status: DC

## 2020-09-28 NOTE — Discharge Instructions (Addendum)
I have sent in a refill of your Hyzaar to [facility].  Follow-up with primary care as needed.  If you begin to have the worst headache of your life, nausea, vomiting, chest pain, loss of sensation in the limbs, or other concerning symptoms follow-up with the ER

## 2020-09-28 NOTE — ED Provider Notes (Signed)
[facility]    CSN: [id number] Arrival date & time: 09/28/20  1340      History   Chief Complaint Chief Complaint  Patient presents with  . Nausea  . Hypertension    HPI Rodney Rose is a 44 y.o. male.   Reports that he is out of blood pressure medication.  Reports that he called his primary care and that he thought the medicine was sent, but reports that he was told that [facility] not [facility].  Reports that he is having headaches and this is how he knows that his blood pressure is increased.  Denies shortness of breath, chest pain, loss of sensation in either limbs, diaphoresis, vomiting, other symptoms ROS per HPI    The history is provided by the patient.    Past Medical History:  Diagnosis Date  . Bipolar disorder (HCC)    Dr. Georgia Lopes with [facility] Psych[facility]  . Chronic back pain   . Hypertension 2017  . Schizophrenia [facility])     Patient Active Problem List   Diagnosis Date Noted  . Renal insufficiency 04/09/2019  . Right hip pain 11/15/2018  . Tachycardia 11/15/2018  . Calf swelling 11/15/2018  . Elevated blood sugar 11/03/2018  . Diabetes mellitus, new onset (HCC) 11/03/2018  . Uncontrolled diabetes mellitus (HCC) 11/03/2018  . Mixed dyslipidemia 11/03/2018  . Abnormal EKG 11/03/2018  . Essential hypertension, benign 11/02/2018  . Smoker 11/02/2018  . Bipolar disorder (HCC) 11/02/2018  . Edema 11/02/2018    Past Surgical History:  Procedure Laterality Date  . NO PAST SURGERIES  10/2018       Home Medications    Prior to Admission medications   Medication Sig Start Date End Date Taking? Authorizing Provider  amLODipine (NORVASC) 5 MG tablet Take 1 tablet (5 mg total) by mouth daily. 07/18/20 08/17/20  Nelly Rout, MD  glipiZIDE-metformin (METAGLIP) 5-500 MG tablet Take 2 tablets by mouth 2 (two) times daily before a meal. 03/29/20 04/28/20  Moshe Cipro, NP  haloperidol (HALDOL) 5 MG tablet Take 1 tablet (5 mg total) by  mouth at bedtime. 07/18/20 08/17/20  Nelly Rout, MD  HYDROcodone-homatropine [facility]) 5-1.5 MG/5ML syrup Take 5 mLs by mouth every 6 (six) hours as needed for cough. 01/23/20   Mardella Layman, MD  ibuprofen (ADVIL,MOTRIN) 800 MG tablet Take 1 tablet (800 mg total) by mouth 3 (three) times daily. 03/20/15   Elpidio Anis, PA-C  Insulin Glargine, 2 Unit Dial, (TOUJEO MAX SOLOSTAR) 300 UNIT/ML SOPN Inject 20 Units into the skin at bedtime for 30 days. 02/14/19 04/09/19  Gerhard Munch, MD  Insulin Pen Needle (BD PEN NEEDLE NANO U/F) 32G X 4 MM MISC 1 each by Does not apply route at bedtime. 02/14/19   Gerhard Munch, MD  loperamide (IMODIUM) 2 MG capsule Take 1 capsule (2 mg total) by mouth daily as needed for diarrhea or loose stools. 02/12/19   Wallis Bamberg, PA-C  losartan-hydrochlorothiazide (HYZAAR) 50-12.5 MG tablet Take 1 tablet by mouth daily. 09/28/20   Moshe Cipro, NP  ondansetron (ZOFRAN-ODT) 8 MG disintegrating tablet Take 1 tablet (8 mg total) by mouth every 8 (eight) hours as needed for nausea or vomiting. 02/12/19   Wallis Bamberg, PA-C  Azilsartan-Chlorthalidone 40-12.5 MG TABS Take 1 tablet by mouth daily. Patient not taking: Reported on 04/09/2019 11/02/18 01/23/20  Tysinger, Kermit Balo, PA-C  glyBURIDE-metformin (GLUCOVANCE) 2.5-500 MG tablet Take 1 tablet by mouth 2 (two) times daily with a meal. 04/11/19 03/29/20  Tysinger, Onalee Hua  S, PA-C    Family History Family History  Problem Relation Age of Onset  . Hypertension Mother   . COPD Father   . Asthma Sister   . Heart disease Neg Hx   . Stroke Neg Hx   . Diabetes Neg Hx     Social History Social History   Tobacco Use  . Smoking status: Current Every Day Smoker    Packs/day: 1.00    Years: 6.00    Pack years: 6.00    Types: Cigarettes  . Smokeless tobacco: Never Used  Vaping Use  . Vaping Use: Never used  Substance Use Topics  . Alcohol use: No  . Drug use: No     Allergies   Penicillins   Review of  Systems Review of Systems   Physical Exam Triage Vital Signs ED Triage Vitals  Enc Vitals Group     BP 09/28/20 1526 (!) 199/99     Pulse Rate 09/28/20 1526 92     Resp 09/28/20 1526 17     Temp 09/28/20 1526 99.3 F (37.4 C)     Temp Source 09/28/20 1526 Oral     SpO2 09/28/20 1526 98 %     Weight --      Height --      Head Circumference --      Peak Flow --      Pain Score 09/28/20 1525 0     Pain Loc --      Pain Edu? --      Excl. in GC? --    No data found.  Updated Vital Signs BP (!) 199/99 (BP Location: Right Arm)   Pulse 92   Temp 99.3 F (37.4 C) (Oral)   Resp 17   SpO2 98%   Visual Acuity Right Eye Distance:   Left Eye Distance:   Bilateral Distance:    Right Eye Near:   Left Eye Near:    Bilateral Near:     Physical Exam Vitals and nursing note reviewed.  Constitutional:      General: He is not in acute distress.    Appearance: Normal appearance. He is well-developed and normal weight. He is not ill-appearing.  HENT:     Head: Normocephalic and atraumatic.     Nose: Nose normal.     Mouth/Throat:     Mouth: Mucous membranes are moist.     Pharynx: Oropharynx is clear.  Eyes:     Extraocular Movements: Extraocular movements intact.     Conjunctiva/sclera: Conjunctivae normal.     Pupils: Pupils are equal, round, and reactive to light.  Cardiovascular:     Rate and Rhythm: Normal rate and regular rhythm.     Heart sounds: Normal heart sounds. No murmur heard.  No friction rub. No gallop.   Pulmonary:     Effort: Pulmonary effort is normal. No respiratory distress.     Breath sounds: No stridor. No wheezing, rhonchi or rales.  Chest:     Chest wall: No tenderness.  Abdominal:     General: Bowel sounds are normal.     Palpations: Abdomen is soft.     Tenderness: There is no abdominal tenderness.  Musculoskeletal:        General: Normal range of motion.     Cervical back: Normal range of motion and neck supple.  Skin:    General: Skin  is warm and dry.     Capillary Refill: Capillary refill takes less than 2 seconds.  Neurological:  General: No focal deficit present.     Mental Status: He is alert and oriented to person, place, and time.  Psychiatric:        Mood and Affect: Mood normal.        Behavior: Behavior normal.        Thought Content: Thought content normal.      UC Treatments / Results  Labs (all labs ordered are listed, but only abnormal results are displayed) Labs Reviewed - No data to display  EKG   Radiology No results found.  Procedures Procedures (including critical care time)  Medications Ordered in UC Medications - No data to display  Initial Impression / Assessment and Plan / UC Course  I have reviewed the triage vital signs and the nursing notes.  Pertinent labs & imaging results that were available during my care of the patient were reviewed by me and considered in my medical decision making (see chart for details).     Hypertension Medication refill  Presents today for medication refill of Hyzaar.  Has been out for 4 days Blood pressure is elevated in the office today, declines going to the ER for further evaluation States that if he still feels poorly after taking his medication, he will follow-up there  Follow-up with primary care as scheduled Final Clinical Impressions(s) / UC Diagnoses   Final diagnoses:  Primary hypertension  Medication refill     Discharge Instructions     [facility]  Follow-up with primary care as needed.  If you begin to have the worst headache of your life, nausea, vomiting, chest pain, loss of sensation in the limbs, or other concerning symptoms follow-up with the ER    ED Prescriptions    Medication Sig Dispense Auth. Provider   losartan-hydrochlorothiazide (HYZAAR) 50-12.5 MG tablet Take 1 tablet by mouth daily. 30 tablet Moshe Cipro, NP     PDMP not reviewed this encounter.    Moshe Cipro, NP 09/28/20 1607

## 2020-09-28 NOTE — ED Triage Notes (Signed)
Pt presents with sweating and nausea and headaches. States has been out of BP medication for 4 days.

## 2020-09-29 ENCOUNTER — Telehealth (HOSPITAL_COMMUNITY): Payer: Self-pay | Admitting: Emergency Medicine

## 2020-09-29 ENCOUNTER — Encounter: Payer: Self-pay | Admitting: Family Medicine

## 2020-09-29 NOTE — Telephone Encounter (Signed)
Received note from [place], EMT that patient's prescription did not go through.  Verified that pharmacy did not confirm receipt on prescription.  Called and gave verbal, patient updated

## 2020-09-29 NOTE — Telephone Encounter (Signed)
Pt went to ER yesterday.

## 2020-09-29 NOTE — Telephone Encounter (Signed)
Dismissal sent 

## 2020-11-26 DIAGNOSIS — I639 Cerebral infarction, unspecified: Secondary | ICD-10-CM

## 2020-11-26 HISTORY — DX: Cerebral infarction, unspecified: I63.9

## 2020-12-01 ENCOUNTER — Encounter (HOSPITAL_COMMUNITY): Payer: Self-pay

## 2020-12-01 ENCOUNTER — Other Ambulatory Visit: Payer: Self-pay

## 2020-12-01 ENCOUNTER — Inpatient Hospital Stay (HOSPITAL_COMMUNITY)
Admission: EM | Admit: 2020-12-01 | Discharge: 2020-12-05 | DRG: 064 | Disposition: A | Discharge status: Home / Self Care | Payer: BLUE CROSS/BLUE SHIELD | Attending: Internal Medicine | Admitting: Internal Medicine

## 2020-12-01 ENCOUNTER — Emergency Department (HOSPITAL_COMMUNITY): Payer: BLUE CROSS/BLUE SHIELD

## 2020-12-01 ENCOUNTER — Ambulatory Visit (HOSPITAL_COMMUNITY)
Admission: EM | Admit: 2020-12-01 | Discharge: 2020-12-01 | Disposition: A | Discharge status: Home / Self Care | Payer: BLUE CROSS/BLUE SHIELD

## 2020-12-01 ENCOUNTER — Encounter (HOSPITAL_COMMUNITY): Payer: Self-pay | Admitting: Emergency Medicine

## 2020-12-01 DIAGNOSIS — G8194 Hemiplegia, unspecified affecting left nondominant side: Secondary | ICD-10-CM | POA: Diagnosis present

## 2020-12-01 DIAGNOSIS — I639 Cerebral infarction, unspecified: Secondary | ICD-10-CM | POA: Diagnosis not present

## 2020-12-01 DIAGNOSIS — F209 Schizophrenia, unspecified: Secondary | ICD-10-CM | POA: Diagnosis present

## 2020-12-01 DIAGNOSIS — Z825 Family history of asthma and other chronic lower respiratory diseases: Secondary | ICD-10-CM

## 2020-12-01 DIAGNOSIS — E1140 Type 2 diabetes mellitus with diabetic neuropathy, unspecified: Secondary | ICD-10-CM | POA: Diagnosis present

## 2020-12-01 DIAGNOSIS — Z794 Long term (current) use of insulin: Secondary | ICD-10-CM

## 2020-12-01 DIAGNOSIS — E8770 Fluid overload, unspecified: Secondary | ICD-10-CM | POA: Diagnosis present

## 2020-12-01 DIAGNOSIS — E785 Hyperlipidemia, unspecified: Secondary | ICD-10-CM | POA: Diagnosis present

## 2020-12-01 DIAGNOSIS — Z8673 Personal history of transient ischemic attack (TIA), and cerebral infarction without residual deficits: Secondary | ICD-10-CM

## 2020-12-01 DIAGNOSIS — N1832 Chronic kidney disease, stage 3b: Secondary | ICD-10-CM | POA: Diagnosis present

## 2020-12-01 DIAGNOSIS — I129 Hypertensive chronic kidney disease with stage 1 through stage 4 chronic kidney disease, or unspecified chronic kidney disease: Secondary | ICD-10-CM | POA: Diagnosis present

## 2020-12-01 DIAGNOSIS — D631 Anemia in chronic kidney disease: Secondary | ICD-10-CM | POA: Diagnosis present

## 2020-12-01 DIAGNOSIS — E8809 Other disorders of plasma-protein metabolism, not elsewhere classified: Secondary | ICD-10-CM | POA: Diagnosis present

## 2020-12-01 DIAGNOSIS — Z880 Allergy status to penicillin: Secondary | ICD-10-CM

## 2020-12-01 DIAGNOSIS — F319 Bipolar disorder, unspecified: Secondary | ICD-10-CM | POA: Diagnosis present

## 2020-12-01 DIAGNOSIS — E1121 Type 2 diabetes mellitus with diabetic nephropathy: Secondary | ICD-10-CM

## 2020-12-01 DIAGNOSIS — R471 Dysarthria and anarthria: Secondary | ICD-10-CM | POA: Diagnosis present

## 2020-12-01 DIAGNOSIS — R270 Ataxia, unspecified: Secondary | ICD-10-CM | POA: Diagnosis present

## 2020-12-01 DIAGNOSIS — M7989 Other specified soft tissue disorders: Secondary | ICD-10-CM | POA: Diagnosis present

## 2020-12-01 DIAGNOSIS — F17210 Nicotine dependence, cigarettes, uncomplicated: Secondary | ICD-10-CM | POA: Diagnosis present

## 2020-12-01 DIAGNOSIS — N179 Acute kidney failure, unspecified: Secondary | ICD-10-CM | POA: Diagnosis present

## 2020-12-01 DIAGNOSIS — G8929 Other chronic pain: Secondary | ICD-10-CM | POA: Diagnosis present

## 2020-12-01 DIAGNOSIS — N049 Nephrotic syndrome with unspecified morphologic changes: Secondary | ICD-10-CM | POA: Diagnosis present

## 2020-12-01 DIAGNOSIS — Z79899 Other long term (current) drug therapy: Secondary | ICD-10-CM

## 2020-12-01 DIAGNOSIS — R0600 Dyspnea, unspecified: Secondary | ICD-10-CM

## 2020-12-01 DIAGNOSIS — Z9119 Patient's noncompliance with other medical treatment and regimen: Secondary | ICD-10-CM

## 2020-12-01 DIAGNOSIS — R29705 NIHSS score 5: Secondary | ICD-10-CM | POA: Diagnosis present

## 2020-12-01 DIAGNOSIS — D649 Anemia, unspecified: Secondary | ICD-10-CM

## 2020-12-01 DIAGNOSIS — M545 Low back pain, unspecified: Secondary | ICD-10-CM | POA: Diagnosis present

## 2020-12-01 DIAGNOSIS — J189 Pneumonia, unspecified organism: Secondary | ICD-10-CM

## 2020-12-01 DIAGNOSIS — Z8249 Family history of ischemic heart disease and other diseases of the circulatory system: Secondary | ICD-10-CM

## 2020-12-01 DIAGNOSIS — E1122 Type 2 diabetes mellitus with diabetic chronic kidney disease: Secondary | ICD-10-CM | POA: Diagnosis present

## 2020-12-01 DIAGNOSIS — Z823 Family history of stroke: Secondary | ICD-10-CM

## 2020-12-01 DIAGNOSIS — R29810 Facial weakness: Secondary | ICD-10-CM | POA: Diagnosis present

## 2020-12-01 DIAGNOSIS — Z20822 Contact with and (suspected) exposure to covid-19: Secondary | ICD-10-CM | POA: Diagnosis present

## 2020-12-01 LAB — BASIC METABOLIC PANEL
Anion gap: 9 (ref 5–15)
BUN: 18 mg/dL (ref 6–20)
CO2: 24 mmol/L (ref 22–32)
Calcium: 8.4 mg/dL — ABNORMAL LOW (ref 8.9–10.3)
Chloride: 110 mmol/L (ref 98–111)
Creatinine, Ser: 2.04 mg/dL — ABNORMAL HIGH (ref 0.61–1.24)
GFR, Estimated: 41 mL/min — ABNORMAL LOW (ref >60)
Glucose, Bld: 122 mg/dL — ABNORMAL HIGH (ref 70–99)
Potassium: 3.2 mmol/L — ABNORMAL LOW (ref 3.5–5.1)
Sodium: 143 mmol/L (ref 135–145)

## 2020-12-01 LAB — CBC
HCT: 29 % — ABNORMAL LOW (ref 39.0–52.0)
Hemoglobin: 8.9 g/dL — ABNORMAL LOW (ref 13.0–17.0)
MCH: 25 pg — ABNORMAL LOW (ref 26.0–34.0)
MCHC: 30.7 g/dL (ref 30.0–36.0)
MCV: 81.5 fL (ref 80.0–100.0)
Platelets: 269 10*3/uL (ref 150–400)
RBC: 3.56 MIL/uL — ABNORMAL LOW (ref 4.22–5.81)
RDW: 15.2 % (ref 11.5–15.5)
WBC: 10.3 10*3/uL (ref 4.0–10.5)
nRBC: 0 % (ref 0.0–0.2)

## 2020-12-01 LAB — URINALYSIS, ROUTINE W REFLEX MICROSCOPIC
Bacteria, UA: NONE SEEN
Bilirubin Urine: NEGATIVE
Glucose, UA: NEGATIVE mg/dL
Ketones, ur: NEGATIVE mg/dL
Leukocytes,Ua: NEGATIVE
Nitrite: NEGATIVE
Protein, ur: >=300 mg/dL — AB
Specific Gravity, Urine: 1.018 (ref 1.005–1.030)
pH: 5 (ref 5.0–8.0)

## 2020-12-01 NOTE — ED Triage Notes (Signed)
Pt presents with complaints of left sided weakness, facial droop, slurred speech and off balance gait since Thursday. Reports Thursday he was washing dishes at work and began having some swelling in his hand and assumed the weakness in his arm was from that. While waiting in the lobby for intake the patient lost his balance and almost fell. Pt is alert and oriented. LSN is Wednesday 12/1.

## 2020-12-01 NOTE — ED Triage Notes (Signed)
Pt c/o left side weakness and swollen arm since last Thursday, pt is very slurred speech during triage with unsteady gait. Pt states he started having some swollen on his left arm and he believe it was related to work. Pt is AO x 4 with some unsteady gait and left leg weakness.

## 2020-12-01 NOTE — ED Notes (Signed)
Patient is being discharged from the Urgent Care and sent to the Emergency Department via pov. Per dr lamptey patient is in need of higher level of care due to stroke like symptoms. Patient is aware and verbalizes understanding of plan of care.  Vitals:   12/01/20 1516  BP: (!) 188/84  Pulse: (!) 101  Resp: 19  Temp: 99 F (37.2 C)  SpO2: 99%

## 2020-12-02 ENCOUNTER — Inpatient Hospital Stay (HOSPITAL_COMMUNITY): Payer: BLUE CROSS/BLUE SHIELD

## 2020-12-02 ENCOUNTER — Emergency Department (HOSPITAL_COMMUNITY): Payer: BLUE CROSS/BLUE SHIELD

## 2020-12-02 DIAGNOSIS — I6389 Other cerebral infarction: Secondary | ICD-10-CM | POA: Diagnosis not present

## 2020-12-02 DIAGNOSIS — Z880 Allergy status to penicillin: Secondary | ICD-10-CM | POA: Diagnosis not present

## 2020-12-02 DIAGNOSIS — I129 Hypertensive chronic kidney disease with stage 1 through stage 4 chronic kidney disease, or unspecified chronic kidney disease: Secondary | ICD-10-CM | POA: Diagnosis present

## 2020-12-02 DIAGNOSIS — D649 Anemia, unspecified: Secondary | ICD-10-CM | POA: Diagnosis not present

## 2020-12-02 DIAGNOSIS — J189 Pneumonia, unspecified organism: Secondary | ICD-10-CM | POA: Diagnosis present

## 2020-12-02 DIAGNOSIS — N179 Acute kidney failure, unspecified: Secondary | ICD-10-CM | POA: Diagnosis present

## 2020-12-02 DIAGNOSIS — G8929 Other chronic pain: Secondary | ICD-10-CM | POA: Diagnosis present

## 2020-12-02 DIAGNOSIS — Z20822 Contact with and (suspected) exposure to covid-19: Secondary | ICD-10-CM | POA: Diagnosis present

## 2020-12-02 DIAGNOSIS — I160 Hypertensive urgency: Secondary | ICD-10-CM | POA: Diagnosis not present

## 2020-12-02 DIAGNOSIS — I639 Cerebral infarction, unspecified: Secondary | ICD-10-CM | POA: Diagnosis present

## 2020-12-02 DIAGNOSIS — Z8249 Family history of ischemic heart disease and other diseases of the circulatory system: Secondary | ICD-10-CM | POA: Diagnosis not present

## 2020-12-02 DIAGNOSIS — Z8673 Personal history of transient ischemic attack (TIA), and cerebral infarction without residual deficits: Secondary | ICD-10-CM | POA: Diagnosis not present

## 2020-12-02 DIAGNOSIS — M545 Low back pain, unspecified: Secondary | ICD-10-CM | POA: Diagnosis present

## 2020-12-02 DIAGNOSIS — F319 Bipolar disorder, unspecified: Secondary | ICD-10-CM | POA: Diagnosis present

## 2020-12-02 DIAGNOSIS — G8194 Hemiplegia, unspecified affecting left nondominant side: Secondary | ICD-10-CM | POA: Diagnosis present

## 2020-12-02 DIAGNOSIS — E1122 Type 2 diabetes mellitus with diabetic chronic kidney disease: Secondary | ICD-10-CM | POA: Diagnosis present

## 2020-12-02 DIAGNOSIS — N1832 Chronic kidney disease, stage 3b: Secondary | ICD-10-CM | POA: Diagnosis present

## 2020-12-02 DIAGNOSIS — F17210 Nicotine dependence, cigarettes, uncomplicated: Secondary | ICD-10-CM | POA: Diagnosis present

## 2020-12-02 DIAGNOSIS — R29810 Facial weakness: Secondary | ICD-10-CM | POA: Diagnosis present

## 2020-12-02 DIAGNOSIS — D631 Anemia in chronic kidney disease: Secondary | ICD-10-CM | POA: Diagnosis present

## 2020-12-02 DIAGNOSIS — Z823 Family history of stroke: Secondary | ICD-10-CM | POA: Diagnosis not present

## 2020-12-02 DIAGNOSIS — I340 Nonrheumatic mitral (valve) insufficiency: Secondary | ICD-10-CM | POA: Diagnosis not present

## 2020-12-02 DIAGNOSIS — R471 Dysarthria and anarthria: Secondary | ICD-10-CM | POA: Diagnosis present

## 2020-12-02 DIAGNOSIS — N049 Nephrotic syndrome with unspecified morphologic changes: Secondary | ICD-10-CM | POA: Diagnosis present

## 2020-12-02 DIAGNOSIS — Z825 Family history of asthma and other chronic lower respiratory diseases: Secondary | ICD-10-CM | POA: Diagnosis not present

## 2020-12-02 DIAGNOSIS — F209 Schizophrenia, unspecified: Secondary | ICD-10-CM | POA: Diagnosis present

## 2020-12-02 DIAGNOSIS — I6300 Cerebral infarction due to thrombosis of unspecified precerebral artery: Secondary | ICD-10-CM | POA: Diagnosis not present

## 2020-12-02 DIAGNOSIS — E1140 Type 2 diabetes mellitus with diabetic neuropathy, unspecified: Secondary | ICD-10-CM | POA: Diagnosis present

## 2020-12-02 DIAGNOSIS — Z9119 Patient's noncompliance with other medical treatment and regimen: Secondary | ICD-10-CM | POA: Diagnosis not present

## 2020-12-02 LAB — ECHOCARDIOGRAM COMPLETE
Area-P 1/2: 4.29 cm2
Calc EF: 50 %
Height: 74 in
MV M vel: 5.69 m/s
MV Peak grad: 129.3 mmHg
S' Lateral: 3.9 cm
Single Plane A2C EF: 50.9 %
Single Plane A4C EF: 50.5 %
Weight: 3520.31 oz

## 2020-12-02 LAB — HEPATIC FUNCTION PANEL
ALT: 19 U/L (ref 0–44)
AST: 26 U/L (ref 15–41)
Albumin: 2.5 g/dL — ABNORMAL LOW (ref 3.5–5.0)
Alkaline Phosphatase: 58 U/L (ref 38–126)
Bilirubin, Direct: <0.1 mg/dL (ref 0.0–0.2)
Total Bilirubin: 0.5 mg/dL (ref 0.3–1.2)
Total Protein: 5.6 g/dL — ABNORMAL LOW (ref 6.5–8.1)

## 2020-12-02 LAB — GLUCOSE, CAPILLARY: Glucose-Capillary: 138 mg/dL — ABNORMAL HIGH (ref 70–99)

## 2020-12-02 LAB — RAPID URINE DRUG SCREEN, HOSP PERFORMED
Amphetamines: NOT DETECTED
Barbiturates: NOT DETECTED
Benzodiazepines: NOT DETECTED
Cocaine: NOT DETECTED
Opiates: NOT DETECTED
Tetrahydrocannabinol: NOT DETECTED

## 2020-12-02 LAB — IRON AND TIBC
Iron: 51 ug/dL (ref 45–182)
Saturation Ratios: 20 % (ref 17.9–39.5)
TIBC: 253 ug/dL (ref 250–450)
UIBC: 202 ug/dL

## 2020-12-02 LAB — TROPONIN I (HIGH SENSITIVITY)
Troponin I (High Sensitivity): 181 ng/L (ref <18)
Troponin I (High Sensitivity): 170 ng/L (ref <18)

## 2020-12-02 LAB — RESP PANEL BY RT-PCR (FLU A&B, COVID) ARPGX2
Influenza A by PCR: NEGATIVE
Influenza B by PCR: NEGATIVE
SARS Coronavirus 2 by RT PCR: NEGATIVE

## 2020-12-02 LAB — FERRITIN: Ferritin: 134 ng/mL (ref 24–336)

## 2020-12-02 LAB — BRAIN NATRIURETIC PEPTIDE: B Natriuretic Peptide: 189.7 pg/mL — ABNORMAL HIGH (ref 0.0–100.0)

## 2020-12-02 LAB — HIV ANTIBODY (ROUTINE TESTING W REFLEX): HIV Screen 4th Generation wRfx: NONREACTIVE

## 2020-12-02 MED ORDER — INSULIN ASPART 100 UNIT/ML ~~LOC~~ SOLN: 0.0000 [IU] | Freq: Every day | SUBCUTANEOUS | Status: DC

## 2020-12-02 MED ORDER — SODIUM CHLORIDE 0.9 % IV SOLN
1.0000 g | Freq: Once | INTRAVENOUS | Status: AC
  Administered 2020-12-02: 1 g via INTRAVENOUS
  Filled 2020-12-02: qty 10

## 2020-12-02 MED ORDER — LOSARTAN POTASSIUM 50 MG PO TABS
50.0000 mg | ORAL_TABLET | Freq: Once | ORAL | Status: AC
  Administered 2020-12-02: 50 mg via ORAL
  Filled 2020-12-02: qty 1

## 2020-12-02 MED ORDER — LABETALOL HCL 5 MG/ML IV SOLN: 10.0000 mg | Freq: Once | INTRAVENOUS | Status: DC

## 2020-12-02 MED ORDER — STROKE: EARLY STAGES OF RECOVERY BOOK
Freq: Once | Status: AC
  Filled 2020-12-02: qty 1

## 2020-12-02 MED ORDER — ASPIRIN 300 MG RE SUPP: 300.0000 mg | Freq: Every day | RECTAL | Status: DC

## 2020-12-02 MED ORDER — INSULIN ASPART 100 UNIT/ML ~~LOC~~ SOLN
0.0000 [IU] | Freq: Three times a day (TID) | SUBCUTANEOUS | Status: DC
  Administered 2020-12-03 – 2020-12-04 (×2): 3 [IU] via SUBCUTANEOUS
  Administered 2020-12-04: 2 [IU] via SUBCUTANEOUS
  Administered 2020-12-05: 3 [IU] via SUBCUTANEOUS

## 2020-12-02 MED ORDER — FUROSEMIDE 10 MG/ML IJ SOLN
40.0000 mg | Freq: Once | INTRAMUSCULAR | Status: AC
  Administered 2020-12-02: 40 mg via INTRAVENOUS
  Filled 2020-12-02: qty 4

## 2020-12-02 MED ORDER — AZITHROMYCIN 250 MG PO TABS
500.0000 mg | ORAL_TABLET | Freq: Once | ORAL | Status: AC
  Administered 2020-12-02: 500 mg via ORAL
  Filled 2020-12-02: qty 2

## 2020-12-02 MED ORDER — LABETALOL HCL 5 MG/ML IV SOLN
20.0000 mg | Freq: Once | INTRAVENOUS | Status: AC
  Administered 2020-12-02: 20 mg via INTRAVENOUS
  Filled 2020-12-02: qty 4

## 2020-12-02 MED ORDER — POTASSIUM CHLORIDE CRYS ER 20 MEQ PO TBCR
20.0000 meq | EXTENDED_RELEASE_TABLET | Freq: Two times a day (BID) | ORAL | Status: AC
  Administered 2020-12-02 (×2): 20 meq via ORAL
  Filled 2020-12-02 (×2): qty 1

## 2020-12-02 MED ORDER — ENOXAPARIN SODIUM 40 MG/0.4ML ~~LOC~~ SOLN
40.0000 mg | SUBCUTANEOUS | Status: DC
  Administered 2020-12-02 – 2020-12-05 (×4): 40 mg via SUBCUTANEOUS
  Filled 2020-12-02 (×4): qty 0.4

## 2020-12-02 MED ORDER — HALOPERIDOL 5 MG PO TABS
5.0000 mg | ORAL_TABLET | Freq: Every day | ORAL | Status: DC
  Administered 2020-12-02 – 2020-12-04 (×3): 5 mg via ORAL
  Filled 2020-12-02 (×3): qty 1

## 2020-12-02 MED ORDER — LABETALOL HCL 5 MG/ML IV SOLN
10.0000 mg | Freq: Once | INTRAVENOUS | Status: AC
  Administered 2020-12-02: 10 mg via INTRAVENOUS
  Filled 2020-12-02: qty 4

## 2020-12-02 MED ORDER — ASPIRIN 325 MG PO TABS
325.0000 mg | ORAL_TABLET | Freq: Every day | ORAL | Status: DC
  Administered 2020-12-02 – 2020-12-03 (×2): 325 mg via ORAL
  Filled 2020-12-02 (×2): qty 1

## 2020-12-02 MED ORDER — ACETAMINOPHEN 160 MG/5ML PO SOLN: 650.0000 mg | ORAL | Status: DC | PRN

## 2020-12-02 MED ORDER — SENNOSIDES-DOCUSATE SODIUM 8.6-50 MG PO TABS: 1.0000 | ORAL_TABLET | Freq: Every evening | ORAL | Status: DC | PRN

## 2020-12-02 MED ORDER — HYDROCHLOROTHIAZIDE 12.5 MG PO CAPS
12.5000 mg | ORAL_CAPSULE | Freq: Every day | ORAL | Status: DC
  Administered 2020-12-02: 12.5 mg via ORAL
  Filled 2020-12-02: qty 1

## 2020-12-02 MED ORDER — HYDRALAZINE HCL 20 MG/ML IJ SOLN
5.0000 mg | INTRAMUSCULAR | Status: DC | PRN
  Administered 2020-12-02 – 2020-12-04 (×5): 5 mg via INTRAVENOUS
  Filled 2020-12-02 (×5): qty 1

## 2020-12-02 MED ORDER — ACETAMINOPHEN 650 MG RE SUPP: 650.0000 mg | RECTAL | Status: DC | PRN

## 2020-12-02 MED ORDER — ACETAMINOPHEN 325 MG PO TABS: 650.0000 mg | ORAL_TABLET | ORAL | Status: DC | PRN

## 2020-12-02 NOTE — ED Provider Notes (Signed)
Medical screening examination/treatment/procedure(s) were conducted as a shared visit with non-physician practitioner(s) and myself.  I personally evaluated the patient during the encounter.  EKG Interpretation  Date/Time:  Monday December 01 2020 16:08:08 EST Ventricular Rate:  98 PR Interval:  172 QRS Duration: 100 QT Interval:  386 QTC Calculation: 492 R Axis:   7 Text Interpretation: Normal sinus rhythm Possible Left atrial enlargement Incomplete right bundle branch block Minimal voltage criteria for LVH, may be normal variant ( Cornell product ) Possible Anteroseptal infarct , age undetermined ST & T wave abnormality, consider lateral ischemia Abnormal ECG No previous tracing Confirmed by Linwood Dibbles [phone]) on 12/02/2020 8:14:45 AM   Pt with complaints of speech disturbance.  MRI does show acute infarct.  Pt with also poorly controlled htn, likely contributed to his acute stroke.  Pt given low dose of labetalol as we will avoid a rapid drop in BP with his acute stroke onset several days ago.  Re ordered home meds.  Will check swallow screen.  Continue to monitor blood pressure.  Admit for further treatment, neurology evaluation.   Linwood Dibbles, MD 12/02/20 1027

## 2020-12-02 NOTE — ED Notes (Signed)
Offered pt urinal however he declined.   After explaining him why we need him in bed he agreed to condom cath.

## 2020-12-02 NOTE — ED Notes (Signed)
RN notified about vitals 

## 2020-12-02 NOTE — Progress Notes (Signed)
  Echocardiogram 2D Echocardiogram has been performed.  Rodney Rose 12/02/2020, 3:21 PM

## 2020-12-02 NOTE — Plan of Care (Signed)
  Problem: Ischemic Stroke/TIA Tissue Perfusion: Goal: Complications of ischemic stroke/TIA will be minimized Outcome: Progressing   Problem: Spontaneous Subarachnoid Hemorrhage Tissue Perfusion: Goal: Complications of Spontaneous Subarachnoid Hemorrhage will be minimized Outcome: Progressing   

## 2020-12-02 NOTE — ED Notes (Signed)
Pt taken by transport to vascular procedure and then to the unit report given to 3W.

## 2020-12-02 NOTE — H&P (Signed)
Date: 12/02/2020               Patient Name:  Rodney Rose MRN: [id number]  DOB: 12-19-1976 Age / Sex: 44 y.o., male   PCP: Patient, No Pcp Per         Medical Service: Internal Medicine Teaching Service         Attending Physician: Dr. Reymundo Poll, MD    First Contact: Dr. Gerarda Fraction Pager: [phone]  Second Contact: Dr. Erlinda Hong Pager: [phone]       After Hours (After 5p/  First Contact Pager: [phone]  weekends / holidays): Second Contact Pager: [phone]   Chief Complaint: left-sided weakness   History of Present Illness: Rodney Rose is a 44 y/o gentleman with history of HTN, tobacco use disorder, type II DM who presents with left-sided weakness and slurred speech that began 4 days ago. Patient works as a Public affairs consultant at work which is when he first noticed the left arm weakness. He continued to go about his normal routine thinking it would resolve. Yesterday, his sister noticed left sided facial droop and that he was slurring his speech. She immediately brought him to the ED. At the time of symptom onset, he denied headaches, light headedness, vision changes, numbness/tingling.  He also notes a 2 week history of productive cough and subjective fevers. He has noticed some lower extremity swelling the last few weeks, but denies chest pain, DOE, orthopnea or PND.   Meds:  Current Meds  Medication Sig  . amLODipine (NORVASC) 5 MG tablet Take 1 tablet (5 mg total) by mouth daily.  [name] glipiZIDE-metformin (METAGLIP) 5-500 MG tablet Take 2 tablets by mouth 2 (two) times daily before a meal.  . haloperidol (HALDOL) 5 MG tablet Take 1 tablet (5 mg total) by mouth at bedtime.  . hydrochlorothiazide (HYDRODIURIL) 12.5 MG tablet Take 12.5 mg by mouth daily.  . Insulin Glargine, 2 Unit Dial, (TOUJEO MAX SOLOSTAR) 300 UNIT/ML SOPN Inject 20 Units into the skin at bedtime for 30 days.  . Insulin Pen Needle (BD PEN NEEDLE NANO U/F) 32G X 4 MM MISC 1 each by Does not apply route at bedtime.  [name]  losartan (COZAAR) 50 MG tablet Take 50 mg by mouth daily.     Allergies: Allergies as of 12/01/2020 - Review Complete 12/01/2020  Allergen Reaction Noted  . Penicillins Swelling 11/02/2018   Past Medical History:  Diagnosis Date  . Bipolar disorder (HCC)    Dr. Georgia Lopes with [facility]  . Chronic back pain   . Hypertension 2017  . Schizophrenia (HCC)     Family History:  Family History  Problem Relation Age of Onset  . Hypertension Mother   . COPD Father   . Asthma Sister   . Heart disease Neg Hx   . Stroke Neg Hx   . Diabetes Neg Hx      Social History: lives with his sister, smokes a pack per day for around 10 years, rare EtOH use, no illicit drug use.   Review of Systems: A complete ROS was negative except as per HPI.   Physical Exam: Blood pressure (!) 193/91, pulse 90, temperature 98.3 F (36.8 C), temperature source Oral, resp. rate (!) 24, height 6\' 2"  (1.88 m), weight 99.8 kg, SpO2 95 %. General: awake, alert, pleasant gentleman sitting up in bed in NAD HEENT: [place]/AT, sclera non-injected, EOMI, moist mucous membranes Neck: + JVD CV: RRR; no m/r/g Pulm: normal work of breathing; lungs CTAB Abd: BS+;  abdomen soft, non-tender, non-distended Ext: 1+ pitting edema bilaterally to knees; first three digits on right foot appear discolored compared to the rest of his toes  Neuro: fully oriented, exhibits some slurred speech and word finding difficulties. Cranial nerve exam unremarkable apart from left-sided facial droop. Motor is 4+/5 in LUE, rest of extremities are normal. Sensation intact throughout. Heel-to-shin intact bilaterally. FNF with mild dysmetria compared to right.   EKG: personally reviewed my interpretation is NSR, ST segment abnormalities in lateral leads consistent with previous tracing.   CXR: personally reviewed my interpretation is diffuse patchy infiltrates   Assessment & Plan by Problem: Active Problems:   CVA (cerebral vascular accident)  [facility])  [name] is a 44 y/o gentleman with history of HTN, tobacco use disorder, type II DM who presents with left-sided weakness and slurred speech that began 4 days ago. MRI revealed acute right basal ganglia infarct. Work-up also significant for a normocytic anemia, serum creatinine of 2.04 (1.44 as of a year ago), U/A with proteinuria, and mildly elevated BNP of 189. He was admitted for further work-up and management.   Acute CVA -appreciate neurology following -deficits appear to be improving since arriving to ED; still with left-sided facial droop, slurred speech and LUE weakness  -risk factors include uncontrolled HTN, DM, and tobacco use disorder  -MRA head and neck have been ordered -TTE pending -LDL, A1C  -tele monitoring -permissive HTN for the next 48 hours; BP goal per neuro is systolic of <180; will order prn hydral with parameters  -PT/OT eval  -he has passed swallow eval   Lower extremity edema -appears volume overloaded on exam and is 20 lbs up from last documented weight. He denies any symptoms of heart failure. Echo pending as above.  -will try 1x dose of IV lasix to see how he responds -also checking hepatic function panel to work-up liver disease as potential cause -he does have worsening renal function and proteinuria, make renal disease a potential etiology   Productive cough -patient is afebrile without leukocytosis, decreasing suspicion for bacterial pneumonia -will check RVP  -could also be symptom of heart failure -hold off on continuing antibiotics at this time   Normocytic anemia -denies any symptoms of bleeding -iron studies normal -checking FOBT -will need to work-up anemia of chronic disease if no evidence of GI bleed  HTN -uncontrolled -allowing for permissive HTN in the setting of acute CVA  Type II DM -on glipizide-metformin as an outpatient  -no A1C since 2019 which was 11.1 -SSI while inpatient   DVT ppx: lovenox Diet: HH/CM CODE:  FULL    Dispo: Admit patient to Inpatient with expected length of stay greater than 2 midnights.  SignedBridget Hartshorn, DO 12/02/2020, 4:25 PM  Pager: [phone] After 5pm on weekdays and 1pm on weekends: On Call pager: [phone]

## 2020-12-02 NOTE — ED Notes (Signed)
Date and time results received: 12/02/20     Test:Trop  Critical Value: 181  Name of Provider Notified: Knap

## 2020-12-02 NOTE — ED Provider Notes (Signed)
[facility] EMERGENCY DEPARTMENT Provider Note   CSN: [id number] Arrival date & time: 12/01/20  1542     History Chief Complaint  Patient presents with  . Arm Pain    left  . Weakness    Jahari Billy Landers is a 44 y.o. male.  HPI  Patient is a 44 year old male with past medical history of hypertension, bipolar, schizophrenia, chronic low back pain and diabetes.  He takes glipizide Metformin however does not take insulin although he is prescribed this.  Per patient he was told he did not actually have diabetes.  He does take Haldol.  He also takes losartan hydrochlorothiazide.  He is prescribed amlodipine but does not take this.  He does not have a primary care doctor.  He is presented today for left arm weakness, left leg weakness, cough, fatigue he states that this is been going on since Thursday.  He states that he noticed left arm weakness on Thursday and then stood up and noticed that he was very weak in his left leg as well.  He states that he felt he could not stand up straight because his left leg kept giving out on him.  He states he felt off balance but did not have any vertiginous symptoms.  He also states that for the past 3 weeks he has had a slow onset of gradually worsening shortness of breath.  Worse with laying down at night.  He states he does occasionally wake up gasping for air.  He states he has taken to propping himself up on pillows.  He also has noticed bilateral lower extremity swelling which she states is mild.  Seems to get better when he elevates his legs.  He denies any fevers, nausea or vomiting.  No abdominal pain or chest pain. He states that he does not feel that he has slurred speech or facial droop. I discussed with his wife who states that he has not been talking as he normally does.  She has encouraged him to come to the ER but he was slow to.     Past Medical History:  Diagnosis Date  . Bipolar disorder (HCC)    Dr. [place] Lopes with  [facility]  . Chronic back pain   . Hypertension 2017  . Schizophrenia [facility])     Patient Active Problem List   Diagnosis Date Noted  . CVA (cerebral vascular accident) (HCC) 12/02/2020  . Renal insufficiency 04/09/2019  . Right hip pain 11/15/2018  . Tachycardia 11/15/2018  . Calf swelling 11/15/2018  . Elevated blood sugar 11/03/2018  . Diabetes mellitus, new onset (HCC) 11/03/2018  . Uncontrolled diabetes mellitus (HCC) 11/03/2018  . Mixed dyslipidemia 11/03/2018  . Abnormal EKG 11/03/2018  . Essential hypertension, benign 11/02/2018  . Smoker 11/02/2018  . Bipolar disorder (HCC) 11/02/2018  . Edema 11/02/2018    Past Surgical History:  Procedure Laterality Date  . NO PAST SURGERIES  10/2018       Family History  Problem Relation Age of Onset  . Hypertension Mother   . COPD Father   . Asthma Sister   . Heart disease Neg Hx   . Stroke Neg Hx   . Diabetes Neg Hx     Social History   Tobacco Use  . Smoking status: Current Every Day Smoker    Packs/day: 1.00    Years: 6.00    Pack years: 6.00    Types: Cigarettes  . Smokeless tobacco: Never Used  Vaping Use  .  Vaping Use: Never used  Substance Use Topics  . Alcohol use: No  . Drug use: No    Home Medications Prior to Admission medications   Medication Sig Start Date End Date Taking? Authorizing Provider  amLODipine (NORVASC) 5 MG tablet Take 1 tablet (5 mg total) by mouth daily. 07/18/20 12/02/20 Yes Nelly RoutKumar, Archana, MD  glipiZIDE-metformin (METAGLIP) 5-500 MG tablet Take 2 tablets by mouth 2 (two) times daily before a meal. 03/29/20 12/02/20 Yes Moshe CiproMatthews, Stephanie, NP  haloperidol (HALDOL) 5 MG tablet Take 1 tablet (5 mg total) by mouth at bedtime. 07/18/20 12/02/20 Yes Nelly RoutKumar, Archana, MD  hydrochlorothiazide (HYDRODIURIL) 12.5 MG tablet Take 12.5 mg by mouth daily.   Yes [provider]  Insulin Glargine, 2 Unit Dial, (TOUJEO MAX SOLOSTAR) 300 UNIT/ML SOPN Inject 20 Units into the skin at  bedtime for 30 days. 02/14/19 12/02/20 Yes Gerhard MunchLockwood, Robert, MD  Insulin Pen Needle (BD PEN NEEDLE NANO U/F) 32G X 4 MM MISC 1 each by Does not apply route at bedtime. 02/14/19  Yes Gerhard MunchLockwood, Robert, MD  losartan (COZAAR) 50 MG tablet Take 50 mg by mouth daily.   Yes [provider]  HYDROcodone-homatropine (HYCODAN) 5-1.5 MG/5ML syrup Take 5 mLs by mouth every 6 (six) hours as needed for cough. Patient not taking: Reported on 12/02/2020 01/23/20   Mardella LaymanHagler, Brian, MD  loperamide (IMODIUM) 2 MG capsule Take 1 capsule (2 mg total) by mouth daily as needed for diarrhea or loose stools. Patient not taking: Reported on 12/02/2020 02/12/19   Wallis BambergMani, Mario, PA-C  losartan-hydrochlorothiazide [facility](HYZAAR) 50-12.5 MG tablet Take 1 tablet by mouth daily. Patient not taking: Reported on 12/02/2020 09/28/20   Moshe CiproMatthews, Stephanie, NP  ondansetron (ZOFRAN-ODT) 8 MG disintegrating tablet Take 1 tablet (8 mg total) by mouth every 8 (eight) hours as needed for nausea or vomiting. Patient not taking: Reported on 12/02/2020 02/12/19   Wallis BambergMani, Mario, PA-C  Azilsartan-Chlorthalidone 40-12.5 MG TABS Take 1 tablet by mouth daily. Patient not taking: Reported on 04/09/2019 11/02/18 01/23/20  Tysinger, Kermit Baloavid S, PA-C  glyBURIDE-metformin (GLUCOVANCE) 2.5-500 MG tablet Take 1 tablet by mouth 2 (two) times daily with a meal. 04/11/19 03/29/20  Tysinger, Kermit Baloavid S, PA-C    Allergies    Penicillins  Review of Systems   Review of Systems  Constitutional: Positive for fatigue. Negative for chills and fever.  HENT: Negative for congestion.   Eyes: Negative for pain.  Respiratory: Positive for cough and shortness of breath.   Cardiovascular: Positive for leg swelling. Negative for chest pain and palpitations.  Gastrointestinal: Negative for abdominal pain, diarrhea, nausea and vomiting.  Genitourinary: Negative for dysuria.  Musculoskeletal: Negative for myalgias.       Left arm and leg weakness  Skin: Negative for rash.  Neurological:  Negative for dizziness and headaches.    Physical Exam Updated Vital Signs BP (!) 182/102   Pulse 92   Temp 98.1 F (36.7 C) (Oral)   Resp (!) 23   Ht 6\' 2"  (1.88 m)   Wt 99.8 kg   SpO2 96%   BMI 28.25 kg/m   Physical Exam Vitals and nursing note reviewed.  Constitutional:      General: He is not in acute distress.    Appearance: He is obese.  HENT:     Head: Normocephalic and atraumatic.     Nose: Nose normal.     Mouth/Throat:     Mouth: Mucous membranes are moist.  Eyes:     General: No scleral icterus.  Extraocular Movements: Extraocular movements intact.     Pupils: Pupils are equal, round, and reactive to light.  Neck:     Comments: Faint JVD with hepatojugular reflux Cardiovascular:     Rate and Rhythm: Normal rate and regular rhythm.     Pulses: Normal pulses.     Heart sounds: Normal heart sounds.     Comments: No murmurs rubs or gallops. Pulmonary:     Effort: Pulmonary effort is normal. No respiratory distress.     Breath sounds: No wheezing.     Comments: Lungs clear in all fields no crackles. Abdominal:     Palpations: Abdomen is soft.     Tenderness: There is no abdominal tenderness.  Musculoskeletal:     Cervical back: Normal range of motion.     Right lower leg: Edema present.     Left lower leg: Edema present.     Comments: Bilateral lower extremities with scant symmetric pitting edema to the midshin  Skin:    General: Skin is warm and dry.     Capillary Refill: Capillary refill takes less than 2 seconds.  Neurological:     Mental Status: He is alert. Mental status is at baseline.     Comments: Cranial nerve exam notable for questionable left-sided facial droop is able to form full smile with encouragement but appears more weak.  No difference in eyebrow lift. No other notable cranial nerve abnormalities.  Left upper extremity with weak flexion extension of elbow and shoulder. No other extremity weakness. I did not ambulate patient as he  endorses being weak in his left leg and having difficulty walking.  Finger-nose-finger and heel-to-shin intact.  Psychiatric:        Mood and Affect: Mood normal.        Behavior: Behavior normal.     ED Results / Procedures / Treatments   Labs (all labs ordered are listed, but only abnormal results are displayed) Labs Reviewed  BASIC METABOLIC PANEL - Abnormal; Notable for the following components:      Result Value   Potassium 3.2 (*)    Glucose, Bld 122 (*)    Creatinine, Ser 2.04 (*)    Calcium 8.4 (*)    GFR, Estimated 41 (*)    All other components within normal limits  CBC - Abnormal; Notable for the following components:   RBC 3.56 (*)    Hemoglobin 8.9 (*)    HCT 29.0 (*)    MCH 25.0 (*)    All other components within normal limits  URINALYSIS, ROUTINE W REFLEX MICROSCOPIC - Abnormal; Notable for the following components:   APPearance HAZY (*)    Hgb urine dipstick SMALL (*)    Protein, ur >=300 (*)    All other components within normal limits  BRAIN NATRIURETIC PEPTIDE - Abnormal; Notable for the following components:   B Natriuretic Peptide 189.7 (*)    All other components within normal limits  TROPONIN I (HIGH SENSITIVITY) - Abnormal; Notable for the following components:   Troponin I (High Sensitivity) 181 (*)    All other components within normal limits  TROPONIN I (HIGH SENSITIVITY) - Abnormal; Notable for the following components:   Troponin I (High Sensitivity) 170 (*)    All other components within normal limits  RESP PANEL BY RT-PCR (FLU A&B, COVID) ARPGX2  RESPIRATORY PANEL BY PCR  RAPID URINE DRUG SCREEN, HOSP PERFORMED  HIV ANTIBODY (ROUTINE TESTING W REFLEX)  HEPATIC FUNCTION PANEL  IRON AND TIBC  FERRITIN  CBG MONITORING, ED    EKG EKG Interpretation  Date/Time:  Monday December 01 2020 16:08:08 EST Ventricular Rate:  98 PR Interval:  172 QRS Duration: 100 QT Interval:  386 QTC Calculation: 492 R Axis:   7 Text  Interpretation: Normal sinus rhythm Possible Left atrial enlargement Incomplete right bundle branch block Minimal voltage criteria for LVH, may be normal variant ( Cornell product ) Possible Anteroseptal infarct , age undetermined ST & T wave abnormality, consider lateral ischemia Abnormal ECG No previous tracing Confirmed by Linwood Dibbles [phone]) on 12/02/2020 8:14:45 AM   Radiology CT HEAD WO CONTRAST  Result Date: 12/01/2020 CLINICAL DATA:  Left-sided weakness and facial droop. EXAM: CT HEAD WITHOUT CONTRAST TECHNIQUE: Contiguous axial images were obtained from the base of the skull through the vertex without intravenous contrast. COMPARISON:  None. FINDINGS: Brain: No evidence of acute infarction, hemorrhage, hydrocephalus, extra-axial collection or mass lesion/mass effect. A 2.5 cm x 1.0 cm area of chronic appearing white matter low attenuation is seen within the right basal ganglia. There is no evidence of associated mass effect or midline shift. Vascular: No hyperdense vessel or unexpected calcification. Skull: Normal. Negative for fracture or focal lesion. Sinuses/Orbits: Very mild proximal bilateral ethmoid sinus mucosal thickening versus polyp/mucous retention cyst is seen. Other: None. IMPRESSION: 1. Chronic appearing right basal ganglia infarct. 2. Very mild proximal bilateral ethmoid sinus mucosal thickening versus polyp/mucous retention cyst. Electronically Signed   By: Aram Candela M.D.   On: 12/01/2020 17:52   MR BRAIN WO CONTRAST  Result Date: 12/02/2020 CLINICAL DATA:  Left-sided weakness and facial droop with abnormal CT EXAM: MRI HEAD WITHOUT CONTRAST TECHNIQUE: Multiplanar, multiecho pulse sequences of the brain and surrounding structures were obtained without intravenous contrast. COMPARISON:  None. FINDINGS: Brain: There is restricted diffusion extending from the right lentiform nucleus to the caudate body with involvement of intervening white matter. Additional much milder  diffusion hyperintensity with ADC isointensity along the right caudate head. No evidence of intracranial hemorrhage. There is no intracranial mass or significant mass effect. There is no hydrocephalus or extra-axial fluid collection. Ventricles and sulci are normal in size and configuration. Minimal patchy T2 hyperintensity in the supratentorial white matter is nonspecific but may reflect minor chronic microvascular ischemic changes. Vascular: Major vessel flow voids at the skull base are preserved. Skull and upper cervical spine: Normal marrow signal is preserved. Sinuses/Orbits: Mild polypoid mucosal thickening. Orbits are unremarkable. Other: Sella is unremarkable.  Mastoid air cells are clear. IMPRESSION: Acute infarction involving the right basal ganglia and adjacent white matter. More subacute infarct of the right caudate head. No hemorrhage. Electronically Signed   By: Guadlupe Spanish M.D.   On: 12/02/2020 10:11   DG Chest Portable 1 View  Result Date: 12/02/2020 CLINICAL DATA:  Cough, shortness of breath EXAM: PORTABLE CHEST 1 VIEW COMPARISON:  None. FINDINGS: Vague airspace opacity noted in the left upper lobe and possibly right infrahilar region. Heart is borderline in size. No effusions. No acute bony abnormality. IMPRESSION: Patchy airspace opacities in the left upper lobe and possibly right infrahilar region concerning for pneumonia. Electronically Signed   By: Charlett Nose M.D.   On: 12/02/2020 09:07    Procedures .Critical Care Performed by: Gailen Shelter, PA Authorized by: Gailen Shelter, PA   Critical care provider statement:    Critical care time (minutes):  45   Critical care time was exclusive of:  Separately billable procedures and treating other patients and teaching time   Critical  care was necessary to treat or prevent imminent or life-threatening deterioration of the following conditions: Acute stroke, pneumonia, new onset heart failure.   Critical care was time spent  personally by me on the following activities:  Discussions with consultants, evaluation of patient's response to treatment, examination of patient, review of old charts, re-evaluation of patient's condition, pulse oximetry, ordering and review of radiographic studies, ordering and review of laboratory studies and ordering and performing treatments and interventions   I assumed direction of critical care for this patient from another provider in my specialty: no     (including critical care time)  Medications Ordered in ED Medications  hydrochlorothiazide (MICROZIDE) capsule 12.5 mg (12.5 mg Oral Given 12/02/20 1225)   stroke: mapping our early stages of recovery book (has no administration in time range)  acetaminophen (TYLENOL) tablet 650 mg (has no administration in time range)    Or  acetaminophen (TYLENOL) 160 MG/5ML solution 650 mg (has no administration in time range)    Or  acetaminophen (TYLENOL) suppository 650 mg (has no administration in time range)  senna-docusate (Senokot-S) tablet 1 tablet (has no administration in time range)  enoxaparin (LOVENOX) injection 40 mg (40 mg Subcutaneous Given 12/02/20 1224)  aspirin suppository 300 mg ( Rectal See Alternative 12/02/20 1224)    Or  aspirin tablet 325 mg (325 mg Oral Given 12/02/20 1224)  furosemide (LASIX) injection 40 mg (has no administration in time range)  potassium chloride SA (KLOR-CON) CR tablet 20 mEq (has no administration in time range)  hydrALAZINE (APRESOLINE) injection 5 mg (has no administration in time range)  cefTRIAXone (ROCEPHIN) 1 g in sodium chloride 0.9 % 100 mL IVPB ( Intravenous Stopped 12/02/20 1101)  azithromycin (ZITHROMAX) tablet 500 mg (500 mg Oral Given 12/02/20 1029)  labetalol (NORMODYNE) injection 10 mg (10 mg Intravenous Given 12/02/20 1023)  losartan (COZAAR) tablet 50 mg (50 mg Oral Given 12/02/20 1225)  labetalol (NORMODYNE) injection 20 mg (20 mg Intravenous Given 12/02/20 1225)    ED Course  I have  reviewed the triage vital signs and the nursing notes.  Pertinent labs & imaging results that were available during my care of the patient were reviewed by me and considered in my medical decision making (see chart for details).  Patient is a 44 year old male with past medical history of hypertension, bipolar, schizophrenia, chronic low back pain and diabetes.  He takes glipizide Metformin however does not take insulin although he is prescribed this.  Per patient he was told he did not actually have diabetes.  He does take Haldol.  He also takes losartan hydrochlorothiazide.  He is prescribed amlodipine but does not take this.  He does not have a primary care doctor.  He is presented today for left arm weakness, left leg weakness, cough, fatigue he states that this is been going on since Thursday.  He states that he noticed left arm weakness on Thursday and then stood up and noticed that he was very weak in his left leg as well.  He states that he felt he could not stand up straight because his left leg kept giving out on him.  He states he felt off balance but did not have any vertiginous symptoms.  He also states that for the past 3 weeks he has had a slow onset of gradually worsening shortness of breath.  Worse with laying down at night.  He states he does occasionally wake up gasping for air.  He states he has taken to propping  himself up on pillows.  He also has noticed bilateral lower extremity swelling which she states is mild.  Seems to get better when he elevates his legs.  Patient examination is notable for left arm weakness.  I have concern for stroke however he is outside of TPA and endovascular retrieval window.  Will obtain MRI and discuss with neurology.  Neurology aware of patient and recommends MRI and admission. PE: BL scant edema to mid shin. JVD present w/ hepatojugular reflux maneuver.  As discussed above left upper extremity with discernible weakness  Troponin x2 elevated 180>> 170  patient does not have any chest pain. Reviewed EKG finding physician Dr. Lynelle Doctor.  BMP with potassium 3.2 creatinine shows stable worsening over the past few years.  Consistent with hypertensive nephropathy. Urinalysis shows proteinuria and hemoglobinuria consistent with hypertensive nephropathy CBC with anemia of questionable chronicity.  Patient is not complaining of lightheadedness today doubt this is related to his acute stroke however may be related to his worsening kidney function with decrease EPO output. BNP 190 may be factitiously low due to body habitus. With LVH no evidence of acute ischemia.  Chest x-ray with questionable left upper lobe pneumonia.  Will start with empiric Pneumonia treatment.  Ct head reviewed -no evidence of intracranial hemorrhage ; chronic appearing right basal ganglia infarct 1. Chronic appearing right basal ganglia infarct.  2. Very mild proximal bilateral ethmoid sinus mucosal thickening  versus polyp/mucous retention cyst.     MRI of head obtained patient has acute stroke.  Discussed with neurology a second time to confirm with him that he had stroke they are aware of patient.  Will admit patient to hospitalist.  Clinical Course as of Dec 02 1502  Tue Dec 02, 2020  9629 Assessed patient. He has left arm weakness. Concern for stroke. Well outside of window and has not had any stop/start of symptoms. Consulted neurology who will see patient during hospitalization. Likely new HF as well. Saline lock ordered will obtain additional labs and UDS for concern for other causes of HF.   [WF]  5284 Discussed with Dr. Amada Jupiter of neurology who recommends shoot for a systolic blood pressure of 180. They will evaluate at bedside. Will see patient during hospital admission.   [WF]  1039 Discussed with Dr. Chesley Mires of internal medicine teaching service who will admit.    [WF]    Clinical Course User Index [WF] Gailen Shelter, PA   I discussed this case with my  attending physician who cosigned this note including patient's presenting symptoms, physical exam, and planned diagnostics and interventions. Attending physician stated agreement with plan or made changes to plan which were implemented.   Attending physician assessed patient at bedside.  MDM Rules/Calculators/A&P                          Patient admitted to internal medicine teaching service for acute stroke while outside of window for TPA and endovascular retrieval.  Also with likely new onset heart failure with lower extremity edema, JVP, elevated blood pressure, proteinuria, LVH on EKG.  Also has evidence of Pneumonia.  Left upper lobe infiltrate.  Will cover with antibiotics.  Patient has already had swallow screen study done by bedside nurse.  Final Clinical Impression(s) / ED Diagnoses Final diagnoses:  Cerebrovascular accident (CVA), unspecified mechanism (HCC)  PND (paroxysmal nocturnal dyspnea)  Community acquired pneumonia, unspecified laterality    Rx / DC Orders ED Discharge Orders  None       Solon Augusta [place], [place] 12/02/20 1509    Linwood Dibbles, MD 12/03/20 [phone]

## 2020-12-02 NOTE — Progress Notes (Signed)
Rodney Rose is a 44 y.o. male patient admitted from ED and arrived to the unit at 1545 via stretcher. Patient is awake, alert  & orientated  X 4,  Full Code, VSS - Blood pressure (!) 180/88, pulse 90, temperature 98.4 F (36.9 C), temperature source Oral, resp. rate 17, height 6\' 2"  (1.88 m), weight 99.8 kg, SpO2 99 %.,  IV site WDL: with a transparent dsg that's clean dry and intact. Pt orientation to unit, room and routine. Information packet given to patient.  Admission INP armband ID verified with patient, and in place. SR up x 2, fall risk assessment complete with Patient verbalizing understanding of risks associated with falls. Pt verbalizes an understanding of how to use the call bell and to call for help before getting out of bed.  Skin, clean-dry- intact without evidence of bruising, or skin tears.   No evidence of skin break down noted on exam.  Will cont to monitor and assist as needed.  , RN 12/02/2020 7:11 PM

## 2020-12-02 NOTE — ED Notes (Signed)
Report gotten from Katelynn 

## 2020-12-02 NOTE — Progress Notes (Signed)
Carotid and ABI completed   Please see CV Proc for preliminary results.   Clint Guy, RVT

## 2020-12-02 NOTE — Hospital Course (Addendum)
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 Transcranial bubble study was normal. No LVO on MRA head and neck. Normal sinus rhythm on telemetry. Plan for DAPT x 3 weeks then aspirin alone. Atorvastatin 80 mg.  Aggressive risk factor modification. PT / OT recommending outpatient follow up.    2. 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 Nephrology consulted this admission. No plans for kidney biopsy currently but secondary work up pending (Hep B, Hep C, HIV, SPEP w/ IF, FLC). Volume status improved after IV lasix.  His losartan was maximized to 100 mg daily.    3. Acute on Chronic Anemia: Iron studies  WNL. Blood smear with normocytic anemia. Possible anemia of chronic disease with worsening renal failure. Monitor.    4. HTN: "non compliant" but patient says this is because he cannot afford his Dr.'s copay. He was rationing his prescriptions because he wad denied further refills. He would like to follow up in our [place] clinic. He was discharged on Amlodipine 5, Lostartan-HCTZ 100-12.5 mg, and coreg 12.5 mg. BP still elevated on discharge, 151/73. Needs close follow up for further titration of his meds.   5. Type II DM -on glipizide-metformin as an outpatient  Hgb A1c 5.5

## 2020-12-02 NOTE — Consult Note (Addendum)
Neurology Consult H&P  CC: right sided weakness and dysarthria  History is obtained from: patient, ED, chart  HPI: Rodney Rose is a 44 y.o. male with a PMHx of schizophrenia, bipolar disorder, tobacco use and HTN who presented to [facility]6 with c/o left sided weakness, facial droop, slurred speech and off balance gait since 11/27/20. 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 He thought he had "pulled something" and basically self treated. He stated his hand began to swell and he assumed his LUE weakness was because of that. Pt states that he experienced an off balance gait at home also and actually fell in triage here. Pt stated he "couldn't support the weight of his left leg" and was walking funny. He said his speech was slurred, but improved some in ED, but at writer's exam, he admits it is back to slurred again.   Denies HA, pain anywhere, vision changes, n/v, or dizziness. States he does not wear glasses. + FMHx stroke.   CT showed chronic infarct but MRI brain showed acute infarct right basal ganglia and adjunct white matter and also subacute right caudate head infarct.   Pt states his BP is not very controlled and was high in the ED to over 200. Per ED PA, they restarted one of his home meds to try and keep BP around 180. He was given 325mg  ASA and this was ordered daily.   He will be admitted by hospitalists team and stroke team will f/up tomorrow.    LKW: 12/1  ROS: A complete ROS was performed and is negative except as noted in the HPI.   Past Medical History:  Diagnosis Date  . Bipolar disorder (HCC)    Dr. 14/1 with [facility]  . Chronic back pain   . Hypertension 2017  . Schizophrenia (HCC)      Family History  Problem Relation Age of Onset  . Hypertension Mother   . COPD Father   . Asthma Sister   . Heart disease Neg Hx   . Stroke Neg Hx   . Diabetes Neg Hx     Social History:  reports  that he has been smoking cigarettes. He has a 6.00 pack-year smoking history. He has never used smokeless tobacco. He reports that he does not drink alcohol and does not use drugs.   Prior to Admission medications   Medication Sig Start Date End Date Taking? Authorizing Provider  amLODipine (NORVASC) 5 MG tablet Take 1 tablet (5 mg total) by mouth daily. 07/18/20 12/02/20 Yes 14/7/21, MD  glipiZIDE-metformin (METAGLIP) 5-500 MG tablet Take 2 tablets by mouth 2 (two) times daily before a meal. 03/29/20 12/02/20 Yes 14/7/21, NP  haloperidol (HALDOL) 5 MG tablet Take 1 tablet (5 mg total) by mouth at bedtime. 07/18/20 12/02/20 Yes 14/7/21, MD  hydrochlorothiazide (HYDRODIURIL) 12.5 MG tablet Take 12.5 mg by mouth daily.   Yes [provider]  Insulin Glargine, 2 Unit Dial, (TOUJEO MAX SOLOSTAR) 300 UNIT/ML SOPN Inject 20 Units into the skin at bedtime for 30 days. 02/14/19 12/02/20 Yes 14/7/21, MD  Insulin Pen Needle (BD PEN NEEDLE NANO U/F) 32G X 4 MM MISC 1 each by Does not apply route at bedtime. 02/14/19  Yes 02/16/19, MD  losartan (COZAAR) 50 MG tablet Take 50 mg by mouth daily.   Yes [provider]  HYDROcodone-homatropine (HYCODAN) 5-1.5 MG/5ML syrup Take 5  mLs by mouth every 6 (six) hours as needed for cough. Patient not taking: Reported on 12/02/2020 01/23/20   Mardella Layman, MD  loperamide (IMODIUM) 2 MG capsule Take 1 capsule (2 mg total) by mouth daily as needed for diarrhea or loose stools. Patient not taking: Reported on 12/02/2020 02/12/19   Wallis Bamberg, PA-C  losartan-hydrochlorothiazide [facility]) 50-12.5 MG tablet Take 1 tablet by mouth daily. Patient not taking: Reported on 12/02/2020 09/28/20   Moshe Cipro, NP  ondansetron (ZOFRAN-ODT) 8 MG disintegrating tablet Take 1 tablet (8 mg total) by mouth every 8 (eight) hours as needed for nausea or vomiting. Patient not taking: Reported on 12/02/2020 02/12/19   Wallis Bamberg, PA-C   Azilsartan-Chlorthalidone 40-12.5 MG TABS Take 1 tablet by mouth daily. Patient not taking: Reported on 04/09/2019 11/02/18 01/23/20  Tysinger, Kermit Balo, PA-C  glyBURIDE-metformin (GLUCOVANCE) 2.5-500 MG tablet Take 1 tablet by mouth 2 (two) times daily with a meal. 04/11/19 03/29/20  Tysinger, Kermit Balo, PA-C  1a Level of Conscious.: 0 1b LOC Questions: 0 1c LOC Commands: 0 2 Best Gaze: 0 3 Visual: 0 4 Facial Palsy: 1 5a Motor Arm - left: 1 5b Motor Arm - Right: 0 6a Motor Leg - Left: 0 6b Motor Leg - Right: 0 7 Limb Ataxia: 1 8 Sensory: 0 9 Best Language: 1 10 Dysarthria: 1 11 Extinct. and Inatten.: 0 TOTAL: 5  Exam: Current vital signs: BP (!) 193/91   Pulse 90   Temp 98.3 F (36.8 C) (Oral)   Resp (!) 24   Ht 6\' 2"  (1.88 m)   Wt 99.8 kg   SpO2 95%   BMI 28.25 kg/m   Physical Exam  Constitutional: Appears well-developed and well-nourished.  Psych: Affect appropriate to situation Eyes: No scleral injection HENT: No OP obstrucion Head: Normocephalic.  Cardiovascular: Normal rate and regular rhythm.  Respiratory: Effort normal  GI: Soft.  No distension. There is no tenderness.  Skin: WDI  Neuro: Mental Status: Patient is awake, alert, oriented to person, place, month, year, and situation. Patient is able to give a clear and coherent history. No signs of aphasia or neglect. Cranial Nerves: II: Visual Fields are full. Pupils are equal, round, and reactive to light. III,IV, VI: EOMI without ptosis or diploplia.  V: Facial sensation is symmetric to to light touch VII: Smile reveals left facial droop VIII: hearing is intact to voice X: Uvula elevates symmetrically XI: Shoulder shrug is symmetric. XII: tongue is midline without atrophy or fasciculations.  Motor: Tone is normal. Bulk is normal. 5/5 on right side he has 4/5 weakness of the left arm and leg Sensory: Sensation is symmetric to light touch and temperature in the arms and legs. Deep Tendon Reflexes: 2+ and  symmetric in the biceps and patellae. Plantars: Toes are downgoing bilaterally. Cerebellar: FNF slightly ataxic on the left.   I have reviewed labs in epic and the pertinent results are: Old A1c 11% in 2019. Old 2020 2019.   I have reviewed the images obtained: NCT head showed 1. Chronic appearing right basal ganglia infarct. 2. Very mild proximal bilateral ethmoid sinus mucosal thickening versus polyp/mucous retention cyst.  MRI brain showed Acute infarction involving the right basal ganglia and adjacent white matter. More subacute infarct of the right caudate head. No hemorrhage.  Assessment: Rodney Rose is a 44 y.o. male PMHx of HTN, DM II, schizophrenia, bipolar disorder who presented to ED on 12/6 with c/o left arm and leg weakness, slurred speech, off balance gait  and facial droop since 12/2. MRI was positive for acute infarction of right basal ganglia and adjacent white matter. BP in ED 200s. One home med restarted with BP goal 180. Pt had ASA 324mg  and will receive qd. NIH is 5 so we can just do ASA for now.   Impression: Acute basal ganglia rt and adjacent white matter infarct.   Plan: - Recommend vascular imaging with MRA head and neck-ordered. - Recommend TTE-pending result.  - Recommend labs: HbA1c, lipid panel, TSH. - Recommend Statin if LDL > 70 - Aspirin 325mg  daily. - SBP goal - Introduce meds as needed to normalize BP over next 48 hour, would avoid overly rapid correction - Telemetry monitoring for arrhythmia. - Recommend bedside Swallow screen. - Recommend Stroke education. - Recommend PT/OT/SLP consult. -strict sugar control and BP over next 1-2 days.  -medication education about importance of compliance.    Pt seen by , NP/neuro and later by Dr. . Note/plan to be edited by MD as needed.   I have seen the patient and reviewed the above note.  He has a large subcortical stroke on the right, I suspect due to his uncontrolled  hypertension and diabetes.  He will need therapy as well as secondary risk factor modification as above.  He is now 4 days out from his symptoms, and therefore blood pressure control can be started at this point.   Jimmye Norman, MD [facility] [phone]  If 7pm- 7am, please page neurology on call as listed in AMION.

## 2020-12-03 ENCOUNTER — Inpatient Hospital Stay (HOSPITAL_COMMUNITY): Payer: BLUE CROSS/BLUE SHIELD

## 2020-12-03 DIAGNOSIS — Z720 Tobacco use: Secondary | ICD-10-CM

## 2020-12-03 DIAGNOSIS — N179 Acute kidney failure, unspecified: Secondary | ICD-10-CM

## 2020-12-03 DIAGNOSIS — I6300 Cerebral infarction due to thrombosis of unspecified precerebral artery: Secondary | ICD-10-CM

## 2020-12-03 DIAGNOSIS — I639 Cerebral infarction, unspecified: Principal | ICD-10-CM

## 2020-12-03 DIAGNOSIS — E1121 Type 2 diabetes mellitus with diabetic nephropathy: Secondary | ICD-10-CM

## 2020-12-03 DIAGNOSIS — N049 Nephrotic syndrome with unspecified morphologic changes: Secondary | ICD-10-CM

## 2020-12-03 DIAGNOSIS — D649 Anemia, unspecified: Secondary | ICD-10-CM

## 2020-12-03 LAB — URINALYSIS, ROUTINE W REFLEX MICROSCOPIC
Bacteria, UA: NONE SEEN
Bilirubin Urine: NEGATIVE
Glucose, UA: NEGATIVE mg/dL
Ketones, ur: NEGATIVE mg/dL
Leukocytes,Ua: NEGATIVE
Nitrite: NEGATIVE
Protein, ur: >=300 mg/dL — AB
Specific Gravity, Urine: 1.013 (ref 1.005–1.030)
pH: 5 (ref 5.0–8.0)

## 2020-12-03 LAB — GLUCOSE, CAPILLARY
Glucose-Capillary: 125 mg/dL — ABNORMAL HIGH (ref 70–99)
Glucose-Capillary: 109 mg/dL — ABNORMAL HIGH (ref 70–99)
Glucose-Capillary: 200 mg/dL — ABNORMAL HIGH (ref 70–99)
Glucose-Capillary: 135 mg/dL — ABNORMAL HIGH (ref 70–99)

## 2020-12-03 LAB — VITAMIN B12: Vitamin B-12: 186 pg/mL (ref 180–914)

## 2020-12-03 LAB — LIPID PANEL
Cholesterol: 219 mg/dL — ABNORMAL HIGH (ref 0–200)
HDL: 28 mg/dL — ABNORMAL LOW (ref >40)
LDL Cholesterol: 171 mg/dL — ABNORMAL HIGH (ref 0–99)
Total CHOL/HDL Ratio: 7.8 RATIO
Triglycerides: 99 mg/dL (ref <150)
VLDL: 20 mg/dL (ref 0–40)

## 2020-12-03 LAB — HEMOGLOBIN A1C
Hgb A1c MFr Bld: 5.5 % (ref 4.8–5.6)
Mean Plasma Glucose: 111.15 mg/dL

## 2020-12-03 LAB — SAVE SMEAR(SSMR), FOR PROVIDER SLIDE REVIEW

## 2020-12-03 LAB — CBC WITH DIFFERENTIAL/PLATELET
Abs Immature Granulocytes: 0.04 10*3/uL (ref 0.00–0.07)
Basophils Absolute: 0.1 10*3/uL (ref 0.0–0.1)
Basophils Relative: 1 %
Eosinophils Absolute: 0.2 10*3/uL (ref 0.0–0.5)
Eosinophils Relative: 2 %
HCT: 30.8 % — ABNORMAL LOW (ref 39.0–52.0)
Hemoglobin: 9.6 g/dL — ABNORMAL LOW (ref 13.0–17.0)
Immature Granulocytes: 0 %
Lymphocytes Relative: 16 %
Lymphs Abs: 1.8 10*3/uL (ref 0.7–4.0)
MCH: 24.6 pg — ABNORMAL LOW (ref 26.0–34.0)
MCHC: 31.2 g/dL (ref 30.0–36.0)
MCV: 79 fL — ABNORMAL LOW (ref 80.0–100.0)
Monocytes Absolute: 0.7 10*3/uL (ref 0.1–1.0)
Monocytes Relative: 6 %
Neutro Abs: 8.3 10*3/uL — ABNORMAL HIGH (ref 1.7–7.7)
Neutrophils Relative %: 75 %
Platelets: 308 10*3/uL (ref 150–400)
RBC: 3.9 MIL/uL — ABNORMAL LOW (ref 4.22–5.81)
RDW: 15.2 % (ref 11.5–15.5)
WBC: 11 10*3/uL — ABNORMAL HIGH (ref 4.0–10.5)
nRBC: 0 % (ref 0.0–0.2)

## 2020-12-03 LAB — BASIC METABOLIC PANEL
Anion gap: 12 (ref 5–15)
BUN: 17 mg/dL (ref 6–20)
CO2: 22 mmol/L (ref 22–32)
Calcium: 8.7 mg/dL — ABNORMAL LOW (ref 8.9–10.3)
Chloride: 110 mmol/L (ref 98–111)
Creatinine, Ser: 1.98 mg/dL — ABNORMAL HIGH (ref 0.61–1.24)
GFR, Estimated: 42 mL/min — ABNORMAL LOW (ref >60)
Glucose, Bld: 122 mg/dL — ABNORMAL HIGH (ref 70–99)
Potassium: 3.1 mmol/L — ABNORMAL LOW (ref 3.5–5.1)
Sodium: 144 mmol/L (ref 135–145)

## 2020-12-03 LAB — PROTEIN / CREATININE RATIO, URINE
Creatinine, Urine: 156.9 mg/dL
Protein Creatinine Ratio: 3.8 mg/mg{Cre} — ABNORMAL HIGH (ref 0.00–0.15)
Total Protein, Urine: 596 mg/dL

## 2020-12-03 LAB — PROCALCITONIN: Procalcitonin: <0.1 ng/mL

## 2020-12-03 LAB — SEDIMENTATION RATE: Sed Rate: 40 mm/hr — ABNORMAL HIGH (ref 0–16)

## 2020-12-03 LAB — FOLATE: Folate: 10.2 ng/mL (ref >5.9)

## 2020-12-03 MED ORDER — ASPIRIN 300 MG RE SUPP: 300.0000 mg | Freq: Every day | RECTAL | Status: DC

## 2020-12-03 MED ORDER — POTASSIUM CHLORIDE CRYS ER 20 MEQ PO TBCR
40.0000 meq | EXTENDED_RELEASE_TABLET | Freq: Once | ORAL | Status: AC
  Administered 2020-12-03: 40 meq via ORAL
  Filled 2020-12-03: qty 2

## 2020-12-03 MED ORDER — LOSARTAN POTASSIUM-HCTZ 50-12.5 MG PO TABS: 1.0000 | ORAL_TABLET | Freq: Every day | ORAL | Status: DC

## 2020-12-03 MED ORDER — LOSARTAN POTASSIUM 50 MG PO TABS
50.0000 mg | ORAL_TABLET | Freq: Every day | ORAL | Status: DC
  Administered 2020-12-03: 50 mg via ORAL
  Filled 2020-12-03: qty 1

## 2020-12-03 MED ORDER — HYDROCHLOROTHIAZIDE 12.5 MG PO CAPS
12.5000 mg | ORAL_CAPSULE | Freq: Every day | ORAL | Status: DC
  Administered 2020-12-03: 12.5 mg via ORAL
  Filled 2020-12-03: qty 1

## 2020-12-03 MED ORDER — ASPIRIN 325 MG PO TABS: 325.0000 mg | ORAL_TABLET | Freq: Every day | ORAL | Status: DC

## 2020-12-03 MED ORDER — AMLODIPINE BESYLATE 5 MG PO TABS
5.0000 mg | ORAL_TABLET | Freq: Every day | ORAL | Status: DC
  Administered 2020-12-03 – 2020-12-04 (×2): 5 mg via ORAL
  Filled 2020-12-03 (×2): qty 1

## 2020-12-03 MED ORDER — ASPIRIN 325 MG PO TABS
325.0000 mg | ORAL_TABLET | Freq: Every day | ORAL | Status: DC
  Administered 2020-12-03: 325 mg via ORAL
  Filled 2020-12-03: qty 1

## 2020-12-03 MED ORDER — ASPIRIN EC 81 MG PO TBEC
81.0000 mg | DELAYED_RELEASE_TABLET | Freq: Every day | ORAL | Status: DC
  Administered 2020-12-04 – 2020-12-05 (×2): 81 mg via ORAL
  Filled 2020-12-03 (×2): qty 1

## 2020-12-03 MED ORDER — CLOPIDOGREL BISULFATE 75 MG PO TABS
75.0000 mg | ORAL_TABLET | Freq: Every day | ORAL | Status: DC
  Administered 2020-12-03 – 2020-12-05 (×3): 75 mg via ORAL
  Filled 2020-12-03 (×3): qty 1

## 2020-12-03 MED ORDER — ATORVASTATIN CALCIUM 80 MG PO TABS
80.0000 mg | ORAL_TABLET | Freq: Every day | ORAL | Status: DC
  Administered 2020-12-03 – 2020-12-05 (×3): 80 mg via ORAL
  Filled 2020-12-03 (×3): qty 1

## 2020-12-03 NOTE — Progress Notes (Addendum)
STROKE TEAM PROGRESS NOTE   HISTORY OF PRESENT ILLNESS (per record) Rodney Rose is a 44 y.o. male with a PMHx of schizophrenia, bipolar disorder, tobacco use and HTN who presented to [facility] 12[facility]6 with c/o left sided weakness, facial droop, slurred speech and off balance gait since 11/27/20. 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 He thought he had "pulled something" and basically self treated. He stated his hand began to swell and he assumed his LUE weakness was because of that. Pt states that he experienced an off balance gait at home also and actually fell in triage here. Pt stated he "couldn't support the weight of his left leg" and was walking funny. He said his speech was slurred, but improved some in ED, but at writer's exam, he admits it is back to slurred again.  Denies HA, pain anywhere, vision changes, n/v, or dizziness. States he does not wear glasses. + FMHx stroke.  CT showed chronic infarct but MRI brain showed acute infarct right basal ganglia and adjunct white matter and also subacute right caudate head infarct.  Pt states his BP is not very controlled and was high in the ED to over 200. Per ED PA, they restarted one of his home meds to try and keep BP around 180. He was given 325mg  ASA and this was ordered daily.  He will be admitted by hospitalists team and stroke team will f/up tomorrow.  LKW: 12/1   INTERVAL HISTORY I personally reviewed history of presenting illness with the patient, electronic medical records and imaging films in PACS.  Presented with subacute symptoms of left-sided weakness and gait difficulties.  He denies any prior history of strokes TIAs or significant neurological problems.  There is no history of DVT, pulmonary embolism, stroke in young in the family.    OBJECTIVE Vitals:   12/02/20 2335 12/03/20 0343 12/03/20 0517 12/03/20 0816  BP: (!) 166/89 (!) 188/93 (!) 165/86 (!) 186/88  Pulse:  99 99 99 97  Resp: 17 18  18   Temp: 98.6 F (37 C) 99.2 F (37.3 C)  97.7 F (36.5 C)  TempSrc: Oral Oral  Oral  SpO2: 98% 97%  100%  Weight:      Height:        CBC:  Recent Labs  Lab 12/01/20 1624 12/03/20 0655  WBC 10.3 11.0*  NEUTROABS  --  8.3*  HGB 8.9* 9.6*  HCT 29.0* 30.8*  MCV 81.5 79.0*  PLT 269 308    Basic Metabolic Panel:  Recent Labs  Lab 12/01/20 1624 12/03/20 0655  NA 143 144  K 3.2* 3.1*  CL 110 110  CO2 24 22  GLUCOSE 122* 122*  BUN 18 17  CREATININE 2.04* 1.98*  CALCIUM 8.4* 8.7*    Lipid Panel:     Component Value Date/Time   CHOL 219 (H) 12/03/2020 0111   CHOL 245 (H) 11/02/2018 0925   TRIG 99 12/03/2020 0111   HDL 28 (L) 12/03/2020 0111   HDL 28 (L) 11/02/2018 0925   CHOLHDL 7.8 12/03/2020 0111   VLDL 20 12/03/2020 0111   LDLCALC 171 (H) 12/03/2020 0111   LDLCALC 155 (H) 11/02/2018 0925   HgbA1c:  Lab Results  Component Value Date   HGBA1C 5.5 12/03/2020   Urine Drug Screen:     Component Value Date/Time   LABOPIA NONE DETECTED 12/02/2020 0901   COCAINSCRNUR NONE DETECTED 12/02/2020 0901  LABBENZ NONE DETECTED 12/02/2020 0901   AMPHETMU NONE DETECTED 12/02/2020 0901   THCU NONE DETECTED 12/02/2020 0901   LABBARB NONE DETECTED 12/02/2020 0901    Alcohol Level No results found for: ETH  IMAGING  CT HEAD WO CONTRAST 12/01/2020 IMPRESSION:  1. Chronic appearing right basal ganglia infarct.  2. Very mild proximal bilateral ethmoid sinus mucosal thickening versus polyp/mucous retention cyst.   MR ANGIO HEAD WO CONTRAST MR ANGIO NECK WO CONTRAST 12/02/2020 IMPRESSION:  Normal MRA of the head and neck. No large vessel occlusion, hemodynamically significant stenosis, or other acute vascular abnormality.   MR BRAIN WO CONTRAST 12/02/2020 IMPRESSION:  Acute infarction involving the right basal ganglia and adjacent white matter. More subacute infarct of the right caudate head. No hemorrhage.  DG Chest Portable 1 View   12/02/2020 IMPRESSION:  Patchy airspace opacities in the left upper lobe and possibly right infrahilar region concerning for pneumonia.   VAS [place] ABI WITH/WO TBI 12/02/2020 Summary:  Right: Resting right ankle-brachial index indicates moderate right lower extremity arterial disease.  Left: Resting left ankle-brachial index is within normal range. No evidence of significant left lower extremity arterial disease.  Preliminary    ECHOCARDIOGRAM COMPLETE 12/02/2020 IMPRESSIONS   1. Left ventricular ejection fraction, by estimation, is 50 to 55%. The left ventricle has low normal function. The left ventricle demonstrates global hypokinesis. There is mild concentric left ventricular hypertrophy. Left ventricular diastolic parameters are consistent with Grade II diastolic dysfunction (pseudonormalization). Elevated left atrial pressure.   2. Right ventricular systolic function is normal. The right ventricular size is normal. Tricuspid regurgitation signal is inadequate for assessing PA pressure.   3. Left atrial size was mildly dilated.   4. The mitral valve is normal in structure. Mild to moderate mitral valve regurgitation.  5. The aortic valve is tricuspid. Aortic valve regurgitation is not visualized. No aortic stenosis is present.   Conclusion(s)/Recommendation(s):  No intracardiac source of embolism detected on this transthoracic study. A transesophageal echocardiogram is recommended to exclude cardiac source of embolism if clinically indicated. MR severity may be underestimated. Consider TEE for that indication also.   VAS US CAROTID (at [facility] and [facility] only) 12/02/2020 Summary:  Right Carotid: Velocities in the right ICA are consistent with a 1-39% stenosis.  Left Carotid: Velocities in the left ICA are consistent with a 1-39% stenosis.  Vertebrals:  Bilateral vertebral arteries demonstrate antegrade flow.  Subclavians: Normal flow hemodynamics were seen in bilateral subclavian  arteries. Preliminary    ECG - SR rate 98 BPM. (See cardiology reading for complete details)  PHYSICAL EXAM Blood pressure (!) 186/88, pulse 97, temperature 97.7 F (36.5 C), temperature source Oral, resp. rate 18, height 6\' 2"  (1.88 m), weight 99.8 kg, SpO2 100 %. Pleasant middle-age African-American male not in distress. . Afebrile. Head is nontraumatic. Neck is supple without bruit.    Cardiac exam no murmur or gallop. Lungs are clear to auscultation. Distal pulses are well felt. Neurological Exam Awake alert oriented x 3 normal speech and language. Mild left lower face asymmetry. Tongue midline.  Mild left lower extremity drift. Mild diminished fine finger movements on left. Orbits right over left upper extremity. Mild left grip and hip flexor weakness.. Normal sensation . Normal coordination.  Gait deferred     ASSESSMENT/PLAN [name] with history of schizophrenia, bipolar disorder (Dr. 45 with [facility]), tobacco use, DM, and HTN who presented to the [facility] 12/6 with c/o left sided  weakness, facial droop, slurred speech and off balance gait since 11/27/20. He did not receive IV t-PA due to late presentation (>4.5 hours from time of onset)  Stroke: Acute infarction involving the right basal ganglia and adjacent white matter. More subacute infarct of the right caudate head. Likely cryptogenic etiology  due to large size of his subcortical stroke  Resultant left hemiparesis  Code Stroke CT Head -   large right basal ganglia infarct of indeterminate age  CT head - Chronic appearing right basal ganglia infarct.   MRI head - Acute infarction involving the right basal ganglia and adjacent white matter. More subacute infarct of the right caudate head. No hemorrhage.  MRA H&N - Normal MRA of the head and neck. No large vessel occlusion, hemodynamically significant stenosis, or other acute vascular abnormality.   CTA H&N - not ordered  CT  Perfusion - not ordered  Carotid Doppler - unremarkable  2D Echo - EF 60 - 65%. No cardiac source of emboli identified. MR severity may be underestimated.  S[place] Corona Virus 2 - negative  LDL - 171  HgbA1c - 5.5  UDS - negative  VTE prophylaxis - Lovenox 40 mg daily Diet  Diet Order            Diet heart healthy/carb modified Room service appropriate? Yes; Fluid consistency: Thin  Diet effective now                 No antithrombotic prior to admission, now on aspirin 325 mg daily  Patient counseled to be compliant with his antithrombotic medications  Ongoing aggressive stroke risk factor management  Therapy recommendations:  pending  Disposition:  Pending  Hypertension  Home BP meds: amlodipine, Hyzaar  Current BP meds: amlodipine, Losartan, HCTZ  Stable . Permissive hypertension (OK if < 220/120) but gradually normalize in 5-7 days  . Long-term BP goal normotensive  Hyperlipidemia  Home Lipid lowering medication: none   LDL 171, goal < 70  Current lipid lowering medication: Lipitor 80 mg daily   Continue statin at discharge  Diabetes  Home diabetic meds: insulin ; metformin ; glipizide  Current diabetic meds: SSI   HgbA1c 5.5, goal < 7.0 Recent Labs    12/02/20 2202 12/03/20 0634  GLUCAP 138* 125*    Other Stroke Risk Factors  Cigarette smoker - advised to stop smoking  Overweight, Body mass index is 28.25 kg/m., recommend weight loss, diet and exercise as appropriate   Other Active Problems  Code status - Full code  Anemia - Hgb - 8.9->9.6  Mild leukocytosis - WBC's - 11.0   (afebrile)   Hypokalemia - potassium - 3.1 - supplemented  CXR - Patchy airspace opacities in the left upper lobe and possibly right infrahilar region concerning for pneumonia.   BLE ABIs - moderate right lower extremity arterial disease  Mild to moderate mitral valve regurgitation by echo - (may be underestimated)  Acute vs chronic kidney disease -  Creatinine - 1.98   Schizophrenia, bipolar disorder (Dr. Georgia Lopes with [facility])  Hospital day # 1  Patient presented with subacute left hemiparesis due to large right basal ganglia infarct likely of cryptogenic etiology.  MR angiogram shows no significant large vessel stenosis or occlusion.  Recommend further work-up by checking TEE, loop recorder, TCD bubble study for PFO and sickle cell screen, ANA, antiphospholipid antibodies.  Patient also appears to be at risk for sleep apnea but is declining participation in the sleep smart study. Recommend aspirin Plavix for 3  weeks followed by aspirin alone.  Aggressive risk factor management. Greater than 50% time during this 35-minute visit was spent on coordination of care and discussion with care team and answering questions. Delia HeadyPramod Detra Bores, MD   To contact Stroke Continuity provider, please refer to [email or web address]. After hours, contact General Neurology

## 2020-12-03 NOTE — Evaluation (Signed)
Speech Language Pathology Evaluation Patient Details Name: Rodney Rose MRN: [id number] DOB: 04-22-1976 Today's Date: 12/03/2020 Time: 9233-0076 SLP Time Calculation (min) (ACUTE ONLY): 17 min  Problem List:  Patient Active Problem List   Diagnosis Date Noted  . CVA (cerebral vascular accident) (HCC) 12/02/2020  . Renal insufficiency 04/09/2019  . Right hip pain 11/15/2018  . Tachycardia 11/15/2018  . Calf swelling 11/15/2018  . Elevated blood sugar 11/03/2018  . Diabetes mellitus, new onset (HCC) 11/03/2018  . Uncontrolled diabetes mellitus (HCC) 11/03/2018  . Mixed dyslipidemia 11/03/2018  . Abnormal EKG 11/03/2018  . Essential hypertension, benign 11/02/2018  . Smoker 11/02/2018  . Bipolar disorder (HCC) 11/02/2018  . Edema 11/02/2018   Past Medical History:  Past Medical History:  Diagnosis Date  . Bipolar disorder (HCC)    Dr. Georgia Lopes with [facility]  . Chronic back pain   . Hypertension 2017  . Schizophrenia [facility])    Past Surgical History:  Past Surgical History:  Procedure Laterality Date  . NO PAST SURGERIES  10/2018   HPI:  Pt is a 44 y/o gentleman with history of HTN, tobacco use disorder, type II DM who presented with left-sided weakness and slurred speech that began 4 days prior to admission. 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 More subacute infarct of the right caudate head. MRA normal   Assessment / Plan / Recommendation Clinical Impression  Pt participated in speech/language/cognition evaluation. He stated that he has a ninth-grade education and is currently employed washing dishes at [employer]. He denied any baseline deficits in speech, language, or cognition and stated that he was independent with medication and money management prior to admission. He reported that his speech is now "slurred" and indicated that he believes it is now approximately 80% back to baseline. Pt initially denied any  acute changes in cognition, but at the end of the evaluation stated that more complex tasks were more difficult than at baseline. [facility] Mental Status Examination was completed to evaluate the pt's cognitive-linguistic skills. He achieved a score of 19/30 which is below the normal limits of 25 or more out of 30. He exhibited difficulty in the areas of awareness, memory, and executive function. He also presented with mild to moderate dysarthria characterized by reduced articulatory precision and reduced vocal intensity which negatively impacted speech intelligibility at the conversational level. Skilled SLP services are clinically indicated at this time to improve motor speech and cognitive-linguistic function.    SLP Assessment  SLP Recommendation/Assessment: Patient needs continued Speech Lanaguage Pathology Services SLP Visit Diagnosis: Dysarthria and anarthria (R47.1)    Follow Up Recommendations   (Continued SLP services at level of care recommended by PT/OT)    Frequency and Duration min 2x/week  2 weeks      SLP Evaluation Cognition  Overall Cognitive Status: Impaired/Different from baseline Arousal/Alertness: Awake/alert Orientation Level: Oriented X4 Attention: Focused;Sustained Focused Attention: Appears intact Sustained Attention: Appears intact Memory: Impaired Memory Impairment: Retrieval deficit;Decreased recall of new information (Immediate: 5/5; delayed: 4/5) Awareness: Impaired Awareness Impairment: Emergent impairment Problem Solving: Appears intact Executive Function: Reasoning;Sequencing;Organizing Reasoning: Appears intact (with additional processing time) Sequencing: Impaired Sequencing Impairment: Verbal complex (clock drawing: 0/4 despite repeated attempts) Organizing: Appears intact (backward digit span: 3/3)       Comprehension  Auditory Comprehension Overall Auditory Comprehension: Appears within functional limits for tasks  assessed Yes/No Questions: Within Functional Limits Commands: Within Functional Limits Conversation: Complex Visual Recognition/Discrimination Discrimination: Within  Function Limits    Expression Expression Primary Mode of Expression: Verbal Verbal Expression Overall Verbal Expression: Appears within functional limits for tasks assessed Initiation: No impairment Level of Generative/Spontaneous Verbalization: Conversation Repetition: No impairment Pragmatics: No impairment Written Expression Dominant Hand: Right   Oral / Motor  Oral Motor/Sensory Function Overall Oral Motor/Sensory Function: Mild impairment Facial ROM: Reduced left;Suspected CN VII (facial) dysfunction Facial Symmetry: Abnormal symmetry left;Suspected CN VII (facial) dysfunction Facial Strength: Reduced left;Suspected CN VII (facial) dysfunction Facial Sensation: Within Functional Limits Lingual ROM: Within Functional Limits Lingual Symmetry: Abnormal symmetry left;Suspected CN XII (hypoglossal) dysfunction Lingual Strength: Reduced;Suspected CN XII (hypoglossal) dysfunction Lingual Sensation: Within Functional Limits Motor Speech Overall Motor Speech: Impaired Respiration: Within functional limits Phonation: Low vocal intensity Resonance: Within functional limits Articulation: Impaired Level of Impairment: Sentence Intelligibility: Intelligibility reduced Word: 75-100% accurate Phrase: 75-100% accurate Sentence: 75-100% accurate Conversation: 75-100% accurate Motor Planning: Witnin functional limits Motor Speech Errors: Aware;Consistent   Rodney Rose I. Vear Clock, MS, CCC-SLP Acute Rehabilitation Services Office number [phone] Pager [phone]                    Rodney Rose 12/03/2020, 10:25 AM

## 2020-12-03 NOTE — Progress Notes (Signed)
PT Cancellation Note  Patient Details Name: Rodney Rose MRN: [id number] DOB: [birth year]   Cancelled Treatment:    Reason Eval/Treat Not Completed: Medical issues which prohibited therapy (BP too high, with nurse requesting return after medicated). BP 184/89 prior to any bed mobility or functional tasks, but asymptomatic per pt. RN notified who requested hold off on PT until pt is medicated and BP decreases. Will follow up as able.  Raymond Gurney, PT, DPT Acute Rehabilitation Services  Pager: [phone] Office: [phone]    Jewel Baize 12/03/2020, 9:28 AM

## 2020-12-03 NOTE — Progress Notes (Signed)
  Speech Language Pathology Treatment: Cognitive-Linquistic  Patient Details Name: Rodney Rose MRN: [id number] DOB: 1976/01/01 Today's Date: 12/03/2020 Time: 1000-1020 SLP Time Calculation (min) (ACUTE ONLY): 20 min  Assessment / Plan / Recommendation Clinical Impression  Pt was seen for dysarthria treatment and was cooperative during the session. He was educated regarding the nature of dysarthria, and compensatory strategies to improve speech intelligibility. Dysarthria educational materials were provided and pt verbalized understanding regarding all areas of education. He used compensatory strategies at the phrase level with 60% accuracy increasing to 100% accuracy with cues for overarticulation, rate, and vocal intensity. Pt reported that reducing his rate of speech was most difficult but he was able to demonstrate this during some structured tasks. Clarification was frequently needed during conversation, but generalization was intermittently noted. SLP will continue to follow pt.    HPI HPI: Pt is a 44 y/o gentleman with history of HTN, tobacco use disorder, type II DM who presented with left-sided weakness and slurred speech that began 4 days prior to admission. 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 More subacute infarct of the right caudate head. MRA normal      SLP Plan  Continue with current plan of care  Patient needs continued Speech Lanaguage Pathology Services    Recommendations                   Follow up Recommendations:  (Continued SLP services at level of care recommended by PT/OT) SLP Visit Diagnosis: Dysarthria and anarthria (R47.1) Plan: Continue with current plan of care       Rayah Fines I. Vear Clock, MS, CCC-SLP Acute Rehabilitation Services Office number [phone] Pager [phone]                 [name] 12/03/2020, 10:29 AM

## 2020-12-03 NOTE — Progress Notes (Signed)
OT Cancellation Note  Patient Details Name: Rodney Rose MRN: [id number] DOB: [birth year]   Cancelled Treatment:    Reason Eval/Treat Not Completed: Medical issues which prohibited therapy. Hold per RN 2/2 patient with elevated BP. Will check back as time allows.   Kallie Edward OTR/L Supplemental OT, Department of rehab services [phone]  Rodney Rose R H. 12/03/2020, 9:57 AM

## 2020-12-03 NOTE — Plan of Care (Signed)

## 2020-12-03 NOTE — Consult Note (Addendum)
Reason for Consult: Proteinuria Referring Physician: Dr. Doreene Eland [name] is an 44 y.o. male.  HPI: This is a 44 year old male with a history of uncontrolled hypertension, hyperlipidemia, diabetes mellitus, tobacco use, schizophrenia, and bipolar disorder presented with left-sided weakness and slurred speech that started 4 days prior to admission, noted to have a right basal ganglier infarct and admitted for stroke work-up.  He also endorses bilateral lower extremity edema that has been going on for 1--2 weeks as well as a cough.  He reports having normal urination, does not have any issues emptying his bladder.  Denies any dysuria, hematuria, nausea, vomiting, fevers, chills, shortness of breath, orthopnea, headaches, lightheadedness, dizziness, or chest pain.  He states that he does not have a PCP, was taking losartan however ran out about 3 months ago.  Does endorse taking medication for his diabetes.  He also endorses taking ibuprofen daily for the past week, denies any other NSAID use.  On chart review patient had a urinalysis in 2019 that showed moderate blood and 100 protein. Dipstick in 01/2019 showed moderate hgb and >300 protein.   Trend in Creatinine: Creatinine, Ser  Date/Time Value Ref Range Status  12/03/2020 06:55 AM 1.98 (H) 0.61 - 1.24 mg/dL Final  16/09/9603 54:09 PM 2.04 (H) 0.61 - 1.24 mg/dL Final  81/19/1478 29:56 PM 1.44 (H) 0.61 - 1.24 mg/dL Final  21/30/8657 84:69 AM 1.44 (H) 0.76 - 1.27 mg/dL Final  62/95/2841 32:44 AM 1.22 0.76 - 1.27 mg/dL Final    PMH:   Past Medical History:  Diagnosis Date  . Bipolar disorder (HCC)    Dr. Georgia Lopes with [facility]  . Chronic back pain   . Hypertension 2017  . Schizophrenia (HCC)     PSH:   Past Surgical History:  Procedure Laterality Date  . NO PAST SURGERIES  10/2018    Allergies:  Allergies  Allergen Reactions  . Penicillins Swelling    Medications:   Prior to Admission medications   Medication  Sig Start Date End Date Taking? Authorizing Provider  amLODipine (NORVASC) 5 MG tablet Take 1 tablet (5 mg total) by mouth daily. 07/18/20 12/02/20 Yes Nelly Rout, MD  glipiZIDE-metformin (METAGLIP) 5-500 MG tablet Take 2 tablets by mouth 2 (two) times daily before a meal. 03/29/20 12/02/20 Yes Moshe Cipro, NP  haloperidol (HALDOL) 5 MG tablet Take 1 tablet (5 mg total) by mouth at bedtime. 07/18/20 12/02/20 Yes Nelly Rout, MD  hydrochlorothiazide (HYDRODIURIL) 12.5 MG tablet Take 12.5 mg by mouth daily.   Yes [provider]  Insulin Glargine, 2 Unit Dial, (TOUJEO MAX SOLOSTAR) 300 UNIT/ML SOPN Inject 20 Units into the skin at bedtime for 30 days. 02/14/19 12/02/20 Yes Gerhard Munch, MD  Insulin Pen Needle (BD PEN NEEDLE NANO U/F) 32G X 4 MM MISC 1 each by Does not apply route at bedtime. 02/14/19  Yes Gerhard Munch, MD  losartan (COZAAR) 50 MG tablet Take 50 mg by mouth daily.   Yes [provider]  HYDROcodone-homatropine (HYCODAN) 5-1.5 MG/5ML syrup Take 5 mLs by mouth every 6 (six) hours as needed for cough. Patient not taking: Reported on 12/02/2020 01/23/20   Mardella Layman, MD  loperamide (IMODIUM) 2 MG capsule Take 1 capsule (2 mg total) by mouth daily as needed for diarrhea or loose stools. Patient not taking: Reported on 12/02/2020 02/12/19   Wallis Bamberg, PA-C  losartan-hydrochlorothiazide [facility]) 50-12.5 MG tablet Take 1 tablet by mouth daily. Patient not taking: Reported on 12/02/2020 09/28/20   Ashley Royalty,  Judeth Cornfield, NP  ondansetron (ZOFRAN-ODT) 8 MG disintegrating tablet Take 1 tablet (8 mg total) by mouth every 8 (eight) hours as needed for nausea or vomiting. Patient not taking: Reported on 12/02/2020 02/12/19   Wallis Bamberg, PA-C  Azilsartan-Chlorthalidone 40-12.5 MG TABS Take 1 tablet by mouth daily. Patient not taking: Reported on 04/09/2019 11/02/18 01/23/20  Tysinger, Kermit Balo, PA-C  glyBURIDE-metformin (GLUCOVANCE) 2.5-500 MG tablet Take 1 tablet by mouth 2  (two) times daily with a meal. 04/11/19 03/29/20  Tysinger, Kermit Balo, PA-C    Inpatient medications: . amLODipine  5 mg Oral Daily  . aspirin  325 mg Oral Daily  . atorvastatin  80 mg Oral Daily  . enoxaparin (LOVENOX) injection  40 mg Subcutaneous Q24H  . haloperidol  5 mg Oral QHS  . losartan  50 mg Oral Daily   And  . hydrochlorothiazide  12.5 mg Oral Daily  . insulin aspart  0-15 Units Subcutaneous TID WC  . insulin aspart  0-5 Units Subcutaneous QHS    Discontinued Meds:   Medications Discontinued During This Encounter  Medication Reason  . labetalol (NORMODYNE) injection 10 mg   . ibuprofen (ADVIL,MOTRIN) 800 MG tablet No longer needed (for PRN medications)  . hydrochlorothiazide (MICROZIDE) capsule 12.5 mg   . losartan-hydrochlorothiazide (HYZAAR) 50-12.5 MG per tablet 1 tablet   . aspirin suppository 300 mg   . aspirin tablet 325 mg   . aspirin tablet 325 mg   . aspirin suppository 300 mg     Social History:  reports that he has been smoking cigarettes. He has a 6.00 pack-year smoking history. He has never used smokeless tobacco. He reports that he does not drink alcohol and does not use drugs.  Family History:   Family History  Problem Relation Age of Onset  . Hypertension Mother   . COPD Father   . Asthma Sister   . Heart disease Neg Hx   . Stroke Neg Hx   . Diabetes Neg Hx     Pertinent items are noted in HPI. Weight change:   Intake/Output Summary (Last 24 hours) at 12/03/2020 1431 Last data filed at 12/03/2020 0600 Gross per 24 hour  Intake --  Output 2300 ml  Net -2300 ml   BP (!) 180/79 (BP Location: Right Arm)   Pulse (!) 101   Temp 97.7 F (36.5 C) (Oral)   Resp 18   Ht 6\' 2"  (1.88 m)   Wt 99.8 kg   SpO2 100%   BMI 28.25 kg/m  Vitals:   12/03/20 0343 12/03/20 0517 12/03/20 0816 12/03/20 1317  BP: (!) 188/93 (!) 165/86 (!) 186/88 (!) 180/79  Pulse: 99 99 97 (!) 101  Resp: 18  18 18   Temp: 99.2 F (37.3 C)  97.7 F (36.5 C) 97.7 F (36.5  C)  TempSrc: Oral  Oral Oral  SpO2: 97%  100% 100%  Weight:      Height:         General appearance: alert, cooperative and no distress Neck: no adenopathy, no carotid bruit, no JVD and supple, symmetrical, trachea midline Resp: clear to auscultation bilaterally Cardio: regular rate and rhythm, S1, S2 normal, no murmur, click, rub or gallop GI: soft, non-tender; bowel sounds normal; no masses,  no organomegaly Extremities: Trace BL LE swelling, no sacral edema noted Pulses: 2+ and symmetric Skin: Skin color, texture, turgor normal. No rashes or lesions Neurologic: Alert and oriented,   Labs: Basic Metabolic Panel: Recent Labs  Lab 12/01/20 1624 12/02/20  1513 12/03/20 0655  NA 143  --  144  K 3.2*  --  3.1*  CL 110  --  110  CO2 24  --  22  GLUCOSE 122*  --  122*  BUN 18  --  17  CREATININE 2.04*  --  1.98*  ALBUMIN  --  2.5*  --   CALCIUM 8.4*  --  8.7*   Liver Function Tests: Recent Labs  Lab 12/02/20 1513  AST 26  ALT 19  ALKPHOS 58  BILITOT 0.5  PROT 5.6*  ALBUMIN 2.5*   No results for input(s): LIPASE, AMYLASE in the last 168 hours. No results for input(s): AMMONIA in the last 168 hours. CBC: Recent Labs  Lab 12/01/20 1624 12/03/20 0655  WBC 10.3 11.0*  NEUTROABS  --  8.3*  HGB 8.9* 9.6*  HCT 29.0* 30.8*  MCV 81.5 79.0*  PLT 269 308   PT/INR: (inr:5) Cardiac Enzymes: )No results for input(s): CKTOTAL, CKMB, CKMBINDEX, TROPONINI in the last 168 hours. CBG: Recent Labs  Lab 12/02/20 2202 12/03/20 0634 12/03/20 1317  GLUCAP 138* 125* 109*    Iron Studies:  Recent Labs  Lab 12/02/20 1513  IRON 51  TIBC 253  FERRITIN 134    Xrays/Other Studies: CT HEAD WO CONTRAST  Result Date: 12/01/2020 CLINICAL DATA:  Left-sided weakness and facial droop. EXAM: CT HEAD WITHOUT CONTRAST TECHNIQUE: Contiguous axial images were obtained from the base of the skull through the vertex without intravenous contrast. COMPARISON:  None. FINDINGS:  Brain: No evidence of acute infarction, hemorrhage, hydrocephalus, extra-axial collection or mass lesion/mass effect. A 2.5 cm x 1.0 cm area of chronic appearing white matter low attenuation is seen within the right basal ganglia. There is no evidence of associated mass effect or midline shift. Vascular: No hyperdense vessel or unexpected calcification. Skull: Normal. Negative for fracture or focal lesion. Sinuses/Orbits: Very mild proximal bilateral ethmoid sinus mucosal thickening versus polyp/mucous retention cyst is seen. Other: None. IMPRESSION: 1. Chronic appearing right basal ganglia infarct. 2. Very mild proximal bilateral ethmoid sinus mucosal thickening versus polyp/mucous retention cyst. Electronically Signed   By: Aram Candela M.D.   On: 12/01/2020 17:52   MR ANGIO HEAD WO CONTRAST  Result Date: 12/02/2020 CLINICAL DATA:  Follow-up examination for acute stroke. EXAM: MRA NECK WITHOUT CONTRAST MRA HEAD WITHOUT CONTRAST TECHNIQUE: Multiplanar and multiecho pulse sequences of the neck were obtained without intravenous contrast. Angiographic images of the neck were obtained using MRA technique without intravenous contrast; Angiographic images of the Circle of Willis were obtained using MRA technique without intravenous contrast. COMPARISON:  Previous brain MRI from 12/02/2020. FINDINGS: MRA NECK FINDINGS AORTIC ARCH: Visualized aortic arch of normal caliber with normal branch pattern. No hemodynamically significant stenosis or other abnormality seen about the origin of the great vessels. RIGHT CAROTID SYSTEM: Right common and internal carotid arteries patent without stenosis, evidence for dissection, or occlusion. No significant atheromatous irregularity or narrowing seen about the right carotid bifurcation. LEFT CAROTID SYSTEM: Left common and internal carotid arteries widely patent without stenosis, evidence for dissection or occlusion. No significant atheromatous narrowing or irregularity about  the left carotid bifurcation. VERTEBRAL ARTERIES: Origins of the vertebral arteries not well assessed on this noncontrast examination. Left vertebral artery originates low, possibly directly from the aortic arch or proximal subclavian artery. Right vertebral artery slightly dominant. Visualized portions of the vertebral arteries are widely patent without stenosis, dissection or occlusion. MRA HEAD FINDINGS ANTERIOR CIRCULATION: Visualized distal cervical segments of the internal  carotid arteries are widely patent with symmetric antegrade flow. Petrous, cavernous, and supraclinoid segments widely patent without stenosis or other abnormality. A1 segments widely patent. Normal anterior communicating artery complex. Anterior cerebral arteries widely patent without stenosis. No M1 stenosis or occlusion. Negative MCA bifurcations. Distal MCA branches well perfused and symmetric. POSTERIOR CIRCULATION: Both vertebral arteries widely patent to the vertebrobasilar junction without stenosis. Both PICA patent. Basilar widely patent to its distal aspect without stenosis. Superior cerebral arteries patent bilaterally. Both PCAs well perfused to their distal aspects without stenosis. Small bilateral posterior communicating arteries noted. No aneurysm or other vascular abnormality. IMPRESSION: Normal MRA of the head and neck. No large vessel occlusion, hemodynamically significant stenosis, or other acute vascular abnormality. Electronically Signed   By: Rise Mu M.D.   On: 12/02/2020 20:15   MR ANGIO NECK WO CONTRAST  Result Date: 12/02/2020 CLINICAL DATA:  Follow-up examination for acute stroke. EXAM: MRA NECK WITHOUT CONTRAST MRA HEAD WITHOUT CONTRAST TECHNIQUE: Multiplanar and multiecho pulse sequences of the neck were obtained without intravenous contrast. Angiographic images of the neck were obtained using MRA technique without intravenous contrast; Angiographic images of the Circle of Willis were obtained  using MRA technique without intravenous contrast. COMPARISON:  Previous brain MRI from 12/02/2020. FINDINGS: MRA NECK FINDINGS AORTIC ARCH: Visualized aortic arch of normal caliber with normal branch pattern. No hemodynamically significant stenosis or other abnormality seen about the origin of the great vessels. RIGHT CAROTID SYSTEM: Right common and internal carotid arteries patent without stenosis, evidence for dissection, or occlusion. No significant atheromatous irregularity or narrowing seen about the right carotid bifurcation. LEFT CAROTID SYSTEM: Left common and internal carotid arteries widely patent without stenosis, evidence for dissection or occlusion. No significant atheromatous narrowing or irregularity about the left carotid bifurcation. VERTEBRAL ARTERIES: Origins of the vertebral arteries not well assessed on this noncontrast examination. Left vertebral artery originates low, possibly directly from the aortic arch or proximal subclavian artery. Right vertebral artery slightly dominant. Visualized portions of the vertebral arteries are widely patent without stenosis, dissection or occlusion. MRA HEAD FINDINGS ANTERIOR CIRCULATION: Visualized distal cervical segments of the internal carotid arteries are widely patent with symmetric antegrade flow. Petrous, cavernous, and supraclinoid segments widely patent without stenosis or other abnormality. A1 segments widely patent. Normal anterior communicating artery complex. Anterior cerebral arteries widely patent without stenosis. No M1 stenosis or occlusion. Negative MCA bifurcations. Distal MCA branches well perfused and symmetric. POSTERIOR CIRCULATION: Both vertebral arteries widely patent to the vertebrobasilar junction without stenosis. Both PICA patent. Basilar widely patent to its distal aspect without stenosis. Superior cerebral arteries patent bilaterally. Both PCAs well perfused to their distal aspects without stenosis. Small bilateral posterior  communicating arteries noted. No aneurysm or other vascular abnormality. IMPRESSION: Normal MRA of the head and neck. No large vessel occlusion, hemodynamically significant stenosis, or other acute vascular abnormality. Electronically Signed   By: Rise Mu M.D.   On: 12/02/2020 20:15   MR BRAIN WO CONTRAST  Result Date: 12/02/2020 CLINICAL DATA:  Left-sided weakness and facial droop with abnormal CT EXAM: MRI HEAD WITHOUT CONTRAST TECHNIQUE: Multiplanar, multiecho pulse sequences of the brain and surrounding structures were obtained without intravenous contrast. COMPARISON:  None. FINDINGS: Brain: There is restricted diffusion extending from the right lentiform nucleus to the caudate body with involvement of intervening white matter. Additional much milder diffusion hyperintensity with ADC isointensity along the right caudate head. No evidence of intracranial hemorrhage. There is no intracranial mass or significant mass effect. There is  no hydrocephalus or extra-axial fluid collection. Ventricles and sulci are normal in size and configuration. Minimal patchy T2 hyperintensity in the supratentorial white matter is nonspecific but may reflect minor chronic microvascular ischemic changes. Vascular: Major vessel flow voids at the skull base are preserved. Skull and upper cervical spine: Normal marrow signal is preserved. Sinuses/Orbits: Mild polypoid mucosal thickening. Orbits are unremarkable. Other: Sella is unremarkable.  Mastoid air cells are clear. IMPRESSION: Acute infarction involving the right basal ganglia and adjacent white matter. More subacute infarct of the right caudate head. No hemorrhage. Electronically Signed   By: Guadlupe Spanish M.D.   On: 12/02/2020 10:11   DG Chest Portable 1 View  Result Date: 12/02/2020 CLINICAL DATA:  Cough, shortness of breath EXAM: PORTABLE CHEST 1 VIEW COMPARISON:  None. FINDINGS: Vague airspace opacity noted in the left upper lobe and possibly right  infrahilar region. Heart is borderline in size. No effusions. No acute bony abnormality. IMPRESSION: Patchy airspace opacities in the left upper lobe and possibly right infrahilar region concerning for pneumonia. Electronically Signed   By: Charlett Nose M.D.   On: 12/02/2020 09:07   VAS [place] ABI WITH/WO TBI  Result Date: 12/02/2020 LOWER EXTREMITY DOPPLER STUDY High Risk Factors: Hypertension, Diabetes, current smoker, prior CVA.  Comparison Study: No previous exam Performing Technologist: Clint Guy RVT  Examination Guidelines: A complete evaluation includes at minimum, Doppler waveform signals and systolic blood pressure reading at the level of bilateral brachial, anterior tibial, and posterior tibial arteries, when vessel segments are accessible. Bilateral testing is considered an integral part of a complete examination. Photoelectric Plethysmograph (PPG) waveforms and toe systolic pressure readings are included as required and additional duplex testing as needed. Limited examinations for reoccurring indications may be performed as noted.  ABI Findings: +--------+------------------+-----+---------+--------+ Right   Rt Pressure (mmHg)IndexWaveform Comment  +--------+------------------+-----+---------+--------+ [id number]                    triphasic         +--------+------------------+-----+---------+--------+ PTA     161               0.75 biphasic          +--------+------------------+-----+---------+--------+ PERO    156               0.73 biphasic          +--------+------------------+-----+---------+--------+ +--------+------------------+-----+---------+-------+ Left    Lt Pressure (mmHg)IndexWaveform Comment +--------+------------------+-----+---------+-------+ [id number]                    triphasic        +--------+------------------+-----+---------+-------+ PTA     234               1.09 triphasic        +--------+------------------+-----+---------+-------+  PERO    239               1.11 triphasic        +--------+------------------+-----+---------+-------+ +-------+-----------+-----------+------------+------------+ ABI/TBIToday's ABIToday's TBIPrevious ABIPrevious TBI +-------+-----------+-----------+------------+------------+ Right  0.75                                           +-------+-----------+-----------+------------+------------+ Left   1.11                                           +-------+-----------+-----------+------------+------------+  Summary: Right: Resting right ankle-brachial index indicates moderate right lower extremity arterial disease. Left: Resting left ankle-brachial index is within normal range. No evidence of significant left lower extremity arterial disease.  *See table(s) above for measurements and observations.    Preliminary    ECHOCARDIOGRAM COMPLETE  Result Date: 12/02/2020    ECHOCARDIOGRAM REPORT   Patient Name:   Ernest PineJASPER J Pallas Date of Exam: 12/02/2020 Medical Rec #:  [id number]        Height:       74.0 in Accession #:    [id number]       Weight:       220.0 lb Date of Birth:  05/05/1976        BSA:          2.263 m Patient Age:    43 years         BP:           199/96 mmHg Patient Gender: M                HR:           90 bpm. Exam Location:  Inpatient Procedure: 2D Echo, Cardiac Doppler and Color Doppler Indications:    Stroke 434.91 / I163.9  History:        Patient has no prior history of Echocardiogram examinations.                 Risk Factors:Hypertension and Current Smoker.  Sonographer:    Renella CunasJulia Swaim RDCS Referring Phys: [id number] JON KNAPP IMPRESSIONS  1. Left ventricular ejection fraction, by estimation, is 50 to 55%. The left ventricle has low normal function. The left ventricle demonstrates global hypokinesis. There is mild concentric left ventricular hypertrophy. Left ventricular diastolic parameters are consistent with Grade II diastolic dysfunction (pseudonormalization). Elevated left atrial  pressure.  2. Right ventricular systolic function is normal. The right ventricular size is normal. Tricuspid regurgitation signal is inadequate for assessing PA pressure.  3. Left atrial size was mildly dilated.  4. The mitral valve is normal in structure. Mild to moderate mitral valve regurgitation.  5. The aortic valve is tricuspid. Aortic valve regurgitation is not visualized. No aortic stenosis is present. Conclusion(s)/Recommendation(s): No intracardiac source of embolism detected on this transthoracic study. A transesophageal echocardiogram is recommended to exclude cardiac source of embolism if clinically indicated. MR severity may be underestimated. Consider TEE for that indication also. FINDINGS  Left Ventricle: Left ventricular ejection fraction, by estimation, is 50 to 55%. The left ventricle has low normal function. The left ventricle demonstrates global hypokinesis. The left ventricular internal cavity size was normal in size. There is mild concentric left ventricular hypertrophy. Left ventricular diastolic parameters are consistent with Grade II diastolic dysfunction (pseudonormalization). Elevated left atrial pressure. Right Ventricle: The right ventricular size is normal. No increase in right ventricular wall thickness. Right ventricular systolic function is normal. Tricuspid regurgitation signal is inadequate for assessing PA pressure. Left Atrium: Left atrial size was mildly dilated. Right Atrium: Right atrial size was normal in size. Pericardium: There is no evidence of pericardial effusion. Mitral Valve: The mitral valve is normal in structure. Mild to moderate mitral valve regurgitation, with eccentric posteriorly directed jet. Tricuspid Valve: The tricuspid valve is normal in structure. Tricuspid valve regurgitation is not demonstrated. Aortic Valve: The aortic valve is tricuspid. Aortic valve regurgitation is not visualized. No aortic stenosis is present. Pulmonic Valve: The pulmonic valve was  grossly normal. Pulmonic valve regurgitation is not visualized. Aorta:  The aortic root and ascending aorta are structurally normal, with no evidence of dilitation. IAS/Shunts: No atrial level shunt detected by color flow Doppler.  LEFT VENTRICLE PLAX 2D LVIDd:         5.40 cm      Diastology LVIDs:         3.90 cm      LV e' medial:    4.38 cm/s LV PW:         1.30 cm      LV E/e' medial:  31.1 LV IVS:        1.30 cm      LV e' lateral:   6.81 cm/s LVOT diam:     2.40 cm      LV E/e' lateral: 20.0 LV SV:         83 LV SV Index:   37 LVOT Area:     4.52 cm  LV Volumes (MOD) LV vol d, MOD A2C: 161.0 ml LV vol d, MOD A4C: 155.0 ml LV vol s, MOD A2C: 79.0 ml LV vol s, MOD A4C: 76.8 ml LV SV MOD A2C:     82.0 ml LV SV MOD A4C:     155.0 ml LV SV MOD BP:      79.7 ml RIGHT VENTRICLE RV S prime:     10.00 cm/s TAPSE (M-mode): 2.1 cm LEFT ATRIUM             Index       RIGHT ATRIUM           Index LA diam:        3.80 cm 1.68 cm/m  RA Area:     13.80 cm LA Vol (A2C):   44.6 ml 19.70 ml/m RA Volume:   35.90 ml  15.86 ml/m LA Vol (A4C):   51.7 ml 22.84 ml/m LA Biplane Vol: 52.2 ml 23.06 ml/m  AORTIC VALVE LVOT Vmax:   103.00 cm/s LVOT Vmean:  71.200 cm/s LVOT VTI:    0.184 m  AORTA Ao Root diam: 3.70 cm Ao Asc diam:  3.10 cm MITRAL VALVE MV Area (PHT): 4.29 cm     SHUNTS MV Decel Time: 177 msec     Systemic VTI:  0.18 m MR Peak grad: 129.3 mmHg    Systemic Diam: 2.40 cm MR Vmax:      568.50 cm/s MV E velocity: 136.00 cm/s MV A velocity: 71.10 cm/s MV E/A ratio:  1.91 Mihai Croitoru MD Electronically signed by Thurmon Fair MD Signature Date/Time: 12/02/2020/4:06:33 PM    Final    VAS US CAROTID (at [facility] and [facility] only)  Result Date: 12/03/2020 Carotid Arterial Duplex Study Indications:       CVA. Risk Factors:      Hypertension, Diabetes, current smoker. Comparison Study:  No previous exam Performing Technologist: Clint Guy  Examination Guidelines: A complete evaluation includes B-mode imaging, spectral Doppler,  color Doppler, and power Doppler as needed of all accessible portions of each vessel. Bilateral testing is considered an integral part of a complete examination. Limited examinations for reoccurring indications may be performed as noted.  Right Carotid Findings: +----------+--------+--------+--------+------------------+--------+           PSV cm/sEDV cm/sStenosisPlaque DescriptionComments +----------+--------+--------+--------+------------------+--------+ CCA Prox  101     18                                         +----------+--------+--------+--------+------------------+--------+  CCA Distal82      18                                         +----------+--------+--------+--------+------------------+--------+ ICA Prox  88      28      1-39%   heterogenous               +----------+--------+--------+--------+------------------+--------+ ICA Distal86      29                                         +----------+--------+--------+--------+------------------+--------+ ECA       130     13                                         +----------+--------+--------+--------+------------------+--------+ +----------+--------+-------+----------------+-------------------+           PSV cm/sEDV cmsDescribe        Arm Pressure (mmHG) +----------+--------+-------+----------------+-------------------+ Subclavian140            Multiphasic, WNL                    +----------+--------+-------+----------------+-------------------+ +---------+--------+--+--------+--+---------+ VertebralPSV cm/s49EDV cm/s17Antegrade +---------+--------+--+--------+--+---------+  Left Carotid Findings: +----------+--------+--------+--------+------------------+--------+           PSV cm/sEDV cm/sStenosisPlaque DescriptionComments +----------+--------+--------+--------+------------------+--------+ CCA Prox  161     23                                          +----------+--------+--------+--------+------------------+--------+ CCA Distal96      21                                         +----------+--------+--------+--------+------------------+--------+ ICA Prox  77      22      1-39%   heterogenous               +----------+--------+--------+--------+------------------+--------+ ICA Distal103     21                                         +----------+--------+--------+--------+------------------+--------+ ECA       115     14                                         +----------+--------+--------+--------+------------------+--------+ +----------+--------+--------+----------------+-------------------+           PSV cm/sEDV cm/sDescribe        Arm Pressure (mmHG) +----------+--------+--------+----------------+-------------------+ [id number]             Multiphasic, WNL                    +----------+--------+--------+----------------+-------------------+ +---------+--------+--+--------+--+---------+ VertebralPSV cm/s50EDV cm/s18Antegrade +---------+--------+--+--------+--+---------+   Summary: Right Carotid: Velocities in the right ICA are consistent with a 1-39% stenosis. Left Carotid: Velocities in the left ICA are consistent with a 1-39% stenosis.  Vertebrals:  Bilateral vertebral arteries demonstrate antegrade flow. Subclavians: Normal flow hemodynamics were seen in bilateral subclavian              arteries. *See table(s) above for measurements and observations.  Electronically signed by Delia Heady MD on 12/03/2020 at 1:18:51 PM.    Final      Assessment/Plan: 1. 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 This is likely CKD from  uncontrolled HTN and DM (mainly DM given degree of proteinuria). Denies any obstructive symptoms, has been eating and drinking with no issues. Has been taking ibuprofen once daily for the past week but otherwise  no changes to his medications.   Will contrinue to monitor for now. Appears euvolemic on exam today to modestly hypervolemic.  1. Daily BMP 2. Avoid nephrotoxic agents 3. Goal BP 125/75 2. Nephrotic syndrome: Patient presenting with lower extremity edema, hypoalbuminemia, hyperlipidemia, and worsening kidney function. 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 Given the presence of hgb on labs done today and noted on prior urine studies there is concern for additional process, typically hgb would not be seen with diabetic nephropathy.  1. Urine microscopy evaluated personally only had 0-2 RBC, nondysmorphic. Will need repeat in a few mths, possible cystocopy. 2. No need for kidney biopsy at this time. 3. 24 hour urine protein, and urine protein/cr ratio pending; will send serologies as well. 4. Will need outpatient nephrology follow up in the next 1-3 mths ([facility]). 5. Maximize ARB and already on Lipitor. 3. Ensure BP and DM control and statin therapyAnemia: Hgb 8.9 on admission. Iron studies unremarkable. Could be 2/2 anemia of chronic disease. Transfuse as needed.  4. Acute CVA: Work up per primary, felt to be due to uncontrolled HTN.  5. HTN:  Currently on amlodipine 5 mg daily, losartan 50 mg daily, and hctz 12.5 mg daily.  6. DM: On SSI.  7. Tobacco use: per primary 8. Schizophrenia: per primary   Claudean Severance 12/03/2020, 2:31 PM   I have seen and examined this patient and agree with the plan of care.   Ethelene Hal, MD 12/03/2020, 3:35 PM

## 2020-12-03 NOTE — Progress Notes (Addendum)
Subjective: HD#1 Patient evaluated at bedside this AM. He states he feels fine this morning and understands he had stroke. States he feels better now and his weakness is improving so would like to go home and would come back to hear about his results. Discussion about possible kidney injury and need to do more testing and shows fair understanding and plans to stay for a day or two. He states he was not taking his blood pressure medication at home regularly, he sees some FM doctor, does not remember the name.  Objective:  Vital signs in last 24 hours: Vitals:   12/02/20 2335 12/03/20 0343 12/03/20 0517 12/03/20 0816  BP: (!) 166/89 (!) 188/93 (!) 165/86 (!) 186/88  Pulse: 99 99 99 97  Resp: 17 18  18   Temp: 98.6 F (37 C) 99.2 F (37.3 C)  97.7 F (36.5 C)  TempSrc: Oral Oral  Oral  SpO2: 98% 97%  100%  Weight:      Height:       CBC Latest Ref Rng & Units 12/03/2020 12/01/2020 02/14/2019  WBC 4.0 - 10.5 K/uL 11.0(H) 10.3 9.6  Hemoglobin 13.0 - 17.0 g/dL 02/16/2019) 8.9(L) 11.7(L)  Hematocrit 39 - 52 % 30.8(L) 29.0(L) 34.9(L)  Platelets 150 - 400 K/uL 308 269 183   BMP Latest Ref Rng & Units 12/03/2020 12/01/2020 02/14/2019  Glucose 70 - 99 mg/dL 02/16/2019) 086(V) 784(O)  BUN 6 - 20 mg/dL 17 18 12   Creatinine 0.61 - 1.24 mg/dL 962(X) ) 5.28(U)  BUN/Creat Ratio 9 - 20 - - -  Sodium 135 - 145 mmol/L 144 143 137  Potassium 3.5 - 5.1 mmol/L 3.1(L) 3.2(L) 3.0(L)  Chloride 98 - 111 mmol/L 110 110 99  CO2 22 - 32 mmol/L 22 24 27   Calcium 8.9 - 10.3 mg/dL 1.32(G) 4.01(U) 8.3(L)   Physical Exam Constitutional:      Appearance: He is obese.  Cardiovascular:     Rate and Rhythm: Normal rate and regular rhythm.     Heart sounds: Normal heart sounds. No murmur heard.  No friction rub. No gallop.   Pulmonary:     Effort: Pulmonary effort is normal.     Breath sounds: Normal breath sounds.  Abdominal:     General: Bowel sounds are normal. There is no distension.     Palpations: Abdomen is  soft.     Tenderness: There is no guarding.  Neurological:     Mental Status: He is alert and oriented to person, place, and time.     Cranial Nerves: Facial asymmetry present.     Sensory: Sensation is intact.     Motor: Weakness present.     Comments:  No signs of aphasia or neglect. Cranial Nerves: II: Visual Fields are full. Pupils are equal, round, and reactive to light. III,IV, VI: EOMI without ptosis or diplopli VII: Smile reveals left facial droop VIII: hearing is intact to voice  Motor: Tone is normal. Bulk is normal. 5/5 on right side he has 4/5 weakness of the left arm and leg Sensory: Sensation is symmetric to light touch in the arms and legs.    Psychiatric:        Mood and Affect: Mood normal.        Behavior: Behavior normal.      Assessment/Plan: Rodney Rose is a 44 y/o M with PMHx of HTN, tobacco use disorder, type II DM, Bipolar Disorder, Schziophrenia who presents with left-sided weakness and slurred speech admitted post stroke for further  work up.  Active Problems:   CVA (cerebral vascular accident) [facility])  #Acute CVA  Presented with left side weakness and dysarthria which started on 12/2. MRI head shows right basal ganglia infarct. MRA head and Angio are normal, no large vessel occlusion. TTE does not show  intracardiac source of embolism. Deficits appears to be improving with 4/5 muscle strength in LUE. Neuro suggests as the patient is psot stroke day 4-5, no need for permissive hypertension at this time and starting his Blood pressure control on home medications. He has passed his swallow evaluation test. Plan is to manage risk factors like Hypertension, Heavy smoking, DM.   -Appreciate Neurology assistence -Aspirin  -Tele monitoring -PT/OT eval     # AKI # possible Nephrotic Syndrome  Presented with lower extremity edema. On examination today, no peripheral edema appreciated. Had ~2.3 L of urine output after 1 dose of lasix. His BNP from yesterday  is 189.7.Echo shows 50-55% EF. Low Albumin 2.5, AST, Alt are normal. Urinalysis shows >300 protein, possible etiology for edema.  -F/U Protein/creatinine ratio -Urine Protein 24 hr -nephro consult     # Normocytic anemia  Patient unaware of low hematoglobin. Iron studies are normal. Folate is 10.2, Vit B12 186. Seems like Hb has decreased over last 2 years.   -F/U smear review -checking FOBT    # Hypertension # Med-non compliance No need for permissive hypertension at this point as post stroke day 5.  -C/W Amlodipine 5mg  -C/W Losartan-Hydrochlorothiazide -PRN IV hydralazine     Type II DM  HbA1c is 5.5, was 11.1 in 2019  -on glipizide-metformin as an outpatient  -SSI  Prior to Admission Living Arrangement: Home Anticipated Discharge Location: Home Barriers to Discharge: Ongoing mangement Dispo: Anticipated discharge in approximately 3-4 day(s).   2020, MD 12/03/2020, 11:46 AM Pager: [phone] After 5pm on weekdays and 1pm on weekends: On Call pager [phone]

## 2020-12-03 NOTE — Evaluation (Signed)
Occupational Therapy Evaluation Patient Details Name: Rodney Rose MRN: [id number] DOB: Oct 10, 1976 Today's Date: 12/03/2020    History of Present Illness 44 y.o. male presenting with L-sided facial droop, slurred speech, and LUE weakness. MRI (+) acute R basal ganglia infarct. Patient also with LE edema and volume overload given lasix. PMHx significant for uncontrolled HTN, tobacco use disorder, DM II, Bipolar disorder and Schizophrenia.    Clinical Impression   PTA patient was living with his sister and was independent with ADLs/IADLs including working as a Public affairs consultant at [employer]. Patient currently presents with mild LUE hemiparesis, decreased FMC/GMC and decreased dynamic standing balance requiring Min guard for steadying during functional transfers. Patient would benefit from continued acute OT services to maximize safety and independence with self-care tasks in prep for safe d/c home with family. Recommendation for OPOT for LUE NMR in prep for return to work. Patient reports that his sister is able to provide transportation.     Follow Up Recommendations  Outpatient OT;Supervision - Intermittent    Equipment Recommendations  None recommended by OT    Recommendations for Other Services       Precautions / Restrictions Precautions Precautions: Fall Restrictions Weight Bearing Restrictions: No      Mobility Bed Mobility Overal bed mobility: Modified Independent             General bed mobility comments: Mod I with use of bed rail.     Transfers Overall transfer level: Needs assistance Equipment used: None Transfers: Sit to/from [employer] Sit to Stand: Min guard Stand pivot transfers: Min guard       General transfer comment: Min guard for steadying in standing.     Balance Overall balance assessment: Needs assistance Sitting-balance support: Feet supported Sitting balance-Leahy Scale: Good Sitting balance - Comments: Able to doff/don  footwear at EOB without LOB     Standing balance-Leahy Scale: Fair Standing balance comment: Able to ambulate from EOB to door without AD with Min guard for steadying.                            ADL either performed or assessed with clinical judgement   ADL Overall ADL's : Needs assistance/impaired Eating/Feeding: Set up;Sitting   Grooming: Min guard;Standing           Upper Body Dressing : Set up;Sitting   Lower Body Dressing: Min guard;Sit to/from stand;Sitting/lateral leans Lower Body Dressing Details (indicate cue type and reason): Patient able to doff/don footwear seated EOB with increased time 2/2 decreased LUE coordination. Would likely require Min guard to steady for LB dressing.  Toilet Transfer: Hydrographic surveyor Details (indicate cue type and reason): Simulated with transfer to recliner.          Functional mobility during ADLs: Min guard General ADL Comments: Min guard for short distance mobility in hospital room. Patient with deficits in stride length and LLE clearance.      Vision Baseline Vision/History: No visual deficits Patient Visual Report: No change from baseline Vision Assessment?: No apparent visual deficits;Yes Eye Alignment: Within Functional Limits Ocular Range of Motion: Within Functional Limits Alignment/Gaze Preference: Within Defined Limits Tracking/Visual Pursuits: Able to track stimulus in all quads without difficulty Visual Fields: No apparent deficits     Perception     Praxis Praxis Praxis tested?: Within functional limits    Pertinent Vitals/Pain Pain Assessment: No/denies pain     Hand Dominance Right   Extremity/Trunk  Assessment Upper Extremity Assessment Upper Extremity Assessment: LUE deficits/detail LUE Deficits / Details: AROM WFL, Brunnstrom stage V-VI.  LUE Sensation: decreased proprioception LUE Coordination: decreased fine motor;decreased gross motor   Lower Extremity Assessment Lower  Extremity Assessment: Defer to PT evaluation   Cervical / Trunk Assessment Cervical / Trunk Assessment: Normal   Communication Communication Communication: No difficulties   Cognition Arousal/Alertness: Awake/alert Behavior During Therapy: WFL for tasks assessed/performed Overall Cognitive Status: Impaired/Different from baseline Area of Impairment: Safety/judgement                         Safety/Judgement: Decreased awareness of deficits         General Comments  Continued elevated BP with RN aware. BP 184/89 at rest and 190/94 after transfer to recliner for lunch.     Exercises     Shoulder Instructions      Home Living Family/patient expects to be discharged to:: Private residence Living Arrangements: Parent;Other relatives (mother and sister) Available Help at Discharge: Family;Available 24 hours/day Type of Home: Apartment Home Access: Stairs to enter Entrance Stairs-Number of Steps: 10 Entrance Stairs-Rails: Left (ascending) Home Layout: One level     Bathroom Shower/Tub: Tub/shower unit;Walk-in shower;Curtain (prefers tub/shower)   Teacher, early years/pre:  (may fit a walker)   Home Equipment: Crutches;Grab bars - toilet;Grab bars - tub/shower          Prior Functioning/Environment Level of Independence: Independent        Comments: Pt works part-time washing dishes. Pt rides bus oir gets risdes from sister. Mother and sister could drive him to appointments.        OT Problem List: Decreased strength;Impaired balance (sitting and/or standing);Decreased coordination;Decreased safety awareness;Decreased knowledge of use of DME or AE;Impaired UE functional use      OT Treatment/Interventions: Self-care/ADL training;Therapeutic exercise;Neuromuscular education;Energy conservation;DME and/or AE instruction;Therapeutic activities;Visual/perceptual remediation/compensation;Balance training;Patient/family education    OT  Goals(Current goals can be found in the care plan section) Acute Rehab OT Goals Patient Stated Goal: To return to work.  OT Goal Formulation: With patient Time For Goal Achievement: 12/17/20 Potential to Achieve Goals: Good ADL Goals Pt Will Perform Upper Body Dressing: Independently Pt Will Perform Lower Body Dressing: Independently;sit to/from stand Pt Will Transfer to Toilet: Independently;ambulating Pt Will Perform Toileting - Clothing Manipulation and hygiene: Independently;sit to/from stand;sitting/lateral leans Pt Will Perform Tub/Shower Transfer: Tub transfer;3 in 1 Additional ADL Goal #1: Patient will demonstrate LUE HEP with I and use of written handout.  OT Frequency: Min 2X/week   Barriers to D/C:            Co-evaluation              AM-PAC OT "6 Clicks" Daily Activity     Outcome Measure Help from another person eating meals?: A Little Help from another person taking care of personal grooming?: A Little Help from another person toileting, which includes using toliet, bedpan, or urinal?: A Little Help from another person bathing (including washing, rinsing, drying)?: A Little Help from another person to put on and taking off regular upper body clothing?: None Help from another person to put on and taking off regular lower body clothing?: A Little 6 Click Score: 19   End of Session Equipment Utilized During Treatment: Gait belt  Activity Tolerance: Patient tolerated treatment well Patient left: in chair;with call bell/phone within reach;with chair alarm set  OT Visit Diagnosis: Unsteadiness on feet (R26.81);Hemiplegia and hemiparesis Hemiplegia -  Right/Left: Left Hemiplegia - dominant/non-dominant: Non-Dominant Hemiplegia - caused by: Cerebral infarction                Time: 6270-3500 OT Time Calculation (min): 19 min Charges:  OT General Charges $OT Visit: 1 Visit OT Evaluation $OT Eval Low Complexity: 1 Low  Lulie Hurd H. OTR/L Supplemental OT,  Department of rehab services [phone]  Jailin Manocchio R H. 12/03/2020, 2:25 PM

## 2020-12-03 NOTE — Progress Notes (Signed)
Lower extremity venous bilateral and TCD bubble studies completed.   Please see CV Proc for preliminary results.   Jean Rosenthal, RDMS

## 2020-12-03 NOTE — Evaluation (Signed)
Physical Therapy Evaluation Patient Details Name: Rodney Rose MRN: [id number] DOB: 17-Mar-1976 Today's Date: 12/03/2020   History of Present Illness  Pt is a 44 year old male who presented with L sided weakness, L facial droop, and slurred speech that began around 4 days prior to arrival. 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 PMH: schizophrenia, HTN, chronic back pain, and bipolar disorder.  Clinical Impression  Pt presents with condition mentioned above and deficits mentioned below, see PT Problem List. He is independent without AD/AE at baseline and works part-time washing dishes. He has 24/7 assistance and transportation available at d/c. Pt is able to perform all bed mob with independence but requires min guard for transfers and for gait without AD/AE. However, he demonstrates balance deficits that result in intermittent LOB during gait and thus minA to recover. This may be due to his slightly decreased L leg strength but more likely due to his incoordination. 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 The DGI score of 13 this date further supports the pt's impaired balance and suggests he is at risk for falls. Pt also required minA when descending steps with 1 hand rail to maintain his safety. Pt displays decreased step length on his L, initially demonstrating a shuffling gait pattern on even surfaces, that improved as speed increased along with cues being provided. Recommending follow-up at a neuro specialty outpatient PT clinic to address his deficits to maximize his independence and safety with all functional mobility. Will continue to follow acutely.   Nurse notified of BP readings and cleared pt for session: BP 172/77 start of session BP 182/76 end of session No signs/symptoms noted throughout    Follow Up Recommendations Outpatient PT;Supervision for mobility/OOB (neuro specialty clinic)     Equipment Recommendations  None recommended by PT    Recommendations for Other Services       Precautions / Restrictions Precautions Precautions: Fall Precaution Comments: HTN Restrictions Weight Bearing Restrictions: No      Mobility  Bed Mobility Overal bed mobility: Independent             General bed mobility comments: Pt able to transition to sit EOB without bed rails and with bed flat independently and safely.    Transfers Overall transfer level: Needs assistance Equipment used: None Transfers: Sit to/from Stand Sit to Stand: Min guard Stand pivot transfers: Min guard       General transfer comment: Min guard for safety, pt able to power up to stand in appropriate time frame.  Ambulation/Gait Ambulation/Gait assistance: Min guard;Min assist Gait Distance (Feet): 180 Feet Assistive device: None Gait Pattern/deviations: Step-through pattern;Decreased step length - left;Decreased stride length;Shuffle;Narrow base of support Gait velocity: decreased Gait velocity interpretation: 1.31 - 2.62 ft/sec, indicative of limited community ambulator General Gait Details: Initially, pt shuffling feet with decreased stride and looking down at feet. Cued pt to look anteriorly and increase step length and stance width, with success. Pt still displayed slightly decreased step length on L compared to R. Min guard assist majority of time but intermittent minA during minor LOB bouts.  Stairs Stairs: Yes Stairs assistance: Min assist;Min guard Stair Management: Alternating pattern;Step to pattern;One rail Left;One rail Right Number of Stairs: 4 General stair comments: Ascending with L rail and descending with R rail to simulate home set-up. Pt alternating between step-to and reciprocal pattern ascending but displayed step-to descending. Min guard assist to  ascend with minor trunk sway noted. MinA descending due to posterior lean and resulting minor LOB intermittently, cued to  correct with success. Cued pt on leading up with R and down with L, with success.  Wheelchair Mobility    Modified Rankin (Stroke Patients Only) Modified Rankin (Stroke Patients Only) Pre-Morbid Rankin Score: No symptoms Modified Rankin: Moderately severe disability     Balance Overall balance assessment: Needs assistance Sitting-balance support: Feet supported Sitting balance-Leahy Scale: Good Sitting balance - Comments: Sitting EOB no LOB with supervision for safety.   Standing balance support: No upper extremity supported;During functional activity Standing balance-Leahy Scale: Fair Standing balance comment: Ambulates with no UE support, but intermittent moments of LOB when provided a challenge while ambulating.                 Standardized Balance Assessment Standardized Balance Assessment : Dynamic Gait Index   Dynamic Gait Index Level Surface: Mild Impairment Change in Gait Speed: Mild Impairment Gait with Horizontal Head Turns: Moderate Impairment Gait with Vertical Head Turns: Moderate Impairment Gait and Pivot Turn: Mild Impairment Step Over Obstacle: Mild Impairment Step Around Obstacles: Mild Impairment Steps: Moderate Impairment Total Score: 13       Pertinent Vitals/Pain Pain Assessment: No/denies pain    Home Living Family/patient expects to be discharged to:: Private residence Living Arrangements: Parent;Other relatives (mother and sister) Available Help at Discharge: Family;Available 24 hours/day Type of Home: Apartment Home Access: Stairs to enter Entrance Stairs-Rails: Left (ascending) Entrance Stairs-Number of Steps: 10 Home Layout: One level Home Equipment: Crutches;Grab bars - toilet;Grab bars - tub/shower      Prior Function Level of Independence: Independent         Comments: Pt works part-time washing dishes. Pt rides bus oir gets risdes from sister. Mother and sister could drive him to appointments.     Hand Dominance    Dominant Hand: Right    Extremity/Trunk Assessment   Upper Extremity Assessment Upper Extremity Assessment: Defer to OT evaluation LUE Deficits / Details: AROM WFL, Brunnstrom stage V-VI.  LUE Sensation: decreased proprioception LUE Coordination: decreased fine motor;decreased gross motor    Lower Extremity Assessment Lower Extremity Assessment: RLE deficits/detail;LLE deficits/detail RLE Deficits / Details: MMT score of 4+ to 5 grossly RLE Sensation: history of peripheral neuropathy (numbness in feet; dyn proprio intact) RLE Coordination:  (possible dysmetria noted) LLE Deficits / Details: MMT score of 4 to 4+ groslly throughout LLE Sensation: history of peripheral neuropathy (numbness in feet; dyn proprio intact) LLE Coordination: decreased fine motor;decreased gross motor (dysdiadochokinesia and dysmetria noted)    Cervical / Trunk Assessment Cervical / Trunk Assessment: Normal  Communication   Communication: No difficulties  Cognition Arousal/Alertness: Awake/alert Behavior During Therapy: WFL for tasks assessed/performed Overall Cognitive Status: Impaired/Different from baseline Area of Impairment: Safety/judgement                         Safety/Judgement: Decreased awareness of deficits     General Comments: A&Ox4. Pt requires cues to acknowledge deficits and correct to maintain safety.      General Comments General comments (skin integrity, edema, etc.): BP 172/77 start of session and 182/76 end of session (cleared by RN prior to treating); no symptoms/signs noted    Exercises     Assessment/Plan    PT Assessment Patient needs continued PT services  PT Problem List Decreased strength;Decreased balance;Decreased mobility;Decreased coordination;Decreased cognition;Decreased knowledge of use of DME;Decreased safety awareness;Impaired sensation  PT Treatment Interventions DME instruction;Gait training;Stair training;Functional mobility  training;Therapeutic activities;Therapeutic exercise;Balance training;Neuromuscular re-education;Cognitive remediation;Patient/family education    PT Goals (Current goals can be found in the Care Plan section)  Acute Rehab PT Goals Patient Stated Goal: to get better PT Goal Formulation: With patient Time For Goal Achievement: 12/17/20 Potential to Achieve Goals: Good    Frequency Min 3X/week   Barriers to discharge        Co-evaluation               AM-PAC PT "6 Clicks" Mobility  Outcome Measure Help needed turning from your back to your side while in a flat bed without using bedrails?: None Help needed moving from lying on your back to sitting on the side of a flat bed without using bedrails?: None Help needed moving to and from a bed to a chair (including a wheelchair)?: A Little Help needed standing up from a chair using your arms (e.g., wheelchair or bedside chair)?: A Little Help needed to walk in hospital room?: A Little Help needed climbing 3-5 steps with a railing? : A Little 6 Click Score: 20    End of Session Equipment Utilized During Treatment: Gait belt Activity Tolerance: Patient tolerated treatment well Patient left: in chair;with call bell/phone within reach;with chair alarm set Nurse Communication: Mobility status;Other (comment) (BP) PT Visit Diagnosis: Unsteadiness on feet (R26.81);Other abnormalities of gait and mobility (R26.89);Muscle weakness (generalized) (M62.81);Other symptoms and signs involving the nervous system (R29.898)    Time: 7517-0017 PT Time Calculation (min) (ACUTE ONLY): 44 min   Charges:   PT Evaluation $PT Eval Moderate Complexity: 1 Mod PT Treatments $Gait Training: 23-37 mins        Raymond Gurney, PT, DPT Acute Rehabilitation Services  Pager: [phone] Office: [phone]    Jewel Baize 12/03/2020, 5:58 PM

## 2020-12-04 DIAGNOSIS — N049 Nephrotic syndrome with unspecified morphologic changes: Secondary | ICD-10-CM

## 2020-12-04 DIAGNOSIS — E1121 Type 2 diabetes mellitus with diabetic nephropathy: Secondary | ICD-10-CM

## 2020-12-04 LAB — PROTEIN, URINE, 24 HOUR
Collection Interval-UPROT: 24 hours
Protein, 24H Urine: 6307 mg/d — ABNORMAL HIGH (ref 50–100)
Protein, Urine: 371 mg/dL
Urine Total Volume-UPROT: 1700 mL

## 2020-12-04 LAB — CBC
HCT: 30.5 % — ABNORMAL LOW (ref 39.0–52.0)
Hemoglobin: 9.3 g/dL — ABNORMAL LOW (ref 13.0–17.0)
MCH: 24.4 pg — ABNORMAL LOW (ref 26.0–34.0)
MCHC: 30.5 g/dL (ref 30.0–36.0)
MCV: 80.1 fL (ref 80.0–100.0)
Platelets: 294 10*3/uL (ref 150–400)
RBC: 3.81 MIL/uL — ABNORMAL LOW (ref 4.22–5.81)
RDW: 15.5 % (ref 11.5–15.5)
WBC: 10.1 10*3/uL (ref 4.0–10.5)
nRBC: 0 % (ref 0.0–0.2)

## 2020-12-04 LAB — GLUCOSE, CAPILLARY
Glucose-Capillary: 112 mg/dL — ABNORMAL HIGH (ref 70–99)
Glucose-Capillary: 177 mg/dL — ABNORMAL HIGH (ref 70–99)
Glucose-Capillary: 123 mg/dL — ABNORMAL HIGH (ref 70–99)
Glucose-Capillary: 107 mg/dL — ABNORMAL HIGH (ref 70–99)

## 2020-12-04 LAB — BASIC METABOLIC PANEL
Anion gap: 11 (ref 5–15)
BUN: 23 mg/dL — ABNORMAL HIGH (ref 6–20)
CO2: 22 mmol/L (ref 22–32)
Calcium: 8.9 mg/dL (ref 8.9–10.3)
Chloride: 112 mmol/L — ABNORMAL HIGH (ref 98–111)
Creatinine, Ser: 2.15 mg/dL — ABNORMAL HIGH (ref 0.61–1.24)
GFR, Estimated: 38 mL/min — ABNORMAL LOW (ref >60)
Glucose, Bld: 120 mg/dL — ABNORMAL HIGH (ref 70–99)
Potassium: 3.6 mmol/L (ref 3.5–5.1)
Sodium: 145 mmol/L (ref 135–145)

## 2020-12-04 LAB — ANA W/REFLEX IF POSITIVE: Anti Nuclear Antibody (ANA): NEGATIVE

## 2020-12-04 LAB — SICKLE CELL SCREEN: Sickle Cell Screen: NEGATIVE

## 2020-12-04 MED ORDER — LOSARTAN POTASSIUM 50 MG PO TABS
100.0000 mg | ORAL_TABLET | Freq: Every day | ORAL | Status: DC
  Administered 2020-12-04 – 2020-12-05 (×2): 100 mg via ORAL
  Filled 2020-12-04 (×2): qty 2

## 2020-12-04 MED ORDER — HYDRALAZINE HCL 20 MG/ML IJ SOLN
10.0000 mg | INTRAMUSCULAR | Status: DC | PRN
  Administered 2020-12-05: 20 mg via INTRAVENOUS
  Administered 2020-12-05: 10 mg via INTRAVENOUS
  Filled 2020-12-04 (×2): qty 1

## 2020-12-04 MED ORDER — HYDROCHLOROTHIAZIDE 12.5 MG PO CAPS
12.5000 mg | ORAL_CAPSULE | Freq: Every day | ORAL | Status: DC
  Administered 2020-12-04 – 2020-12-05 (×2): 12.5 mg via ORAL
  Filled 2020-12-04 (×2): qty 1

## 2020-12-04 NOTE — Progress Notes (Signed)
24 hr urine collection completed and sent to lab.

## 2020-12-04 NOTE — Progress Notes (Addendum)
[facility] Progress Note    Assessment/ Plan:   1. 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 This is likely CKD from  uncontrolled HTN and DM (mainly DM given degree of proteinuria). Denies any obstructive symptoms, has been eating and drinking with no issues. Has been taking ibuprofen once daily for the past week but otherwise no changes to his medications.   Will contrinue to monitor for now. Appears euvolemic on exam today to modestly hypervolemic.   Creatinine increased to 2.15 today from 1.98 yesterday. 1. Daily BMP 2. Avoid nephrotoxic agents 3. Goal BP 125/75 2. Nephrotic syndrome: Patient presenting with lower extremity edema, hypoalbuminemia, hyperlipidemia, and worsening kidney function. 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 Given the presence of hgb on urine studies labs done today and noted on prior urine studies there is concern for additional process, typically hgb would not be seen with diabetic nephropathy.  1. Urine microscopy evaluated -> only had 0-2 RBC, nondysmorphic. Will need repeat in a few mths, possible cystocopy. 2. No need for kidney biopsy at this time; UPC 3.8 likely from diabetic nephropathy. Serologies pending. 3. Will need outpatient nephrology follow up in the next 1-3 mths ([facility]). 4. Maximize ARB and already on Lipitor. 5. Ensure BP and DM control and statin therapy 3. Anemia: Hgb 8.9 on admission. Iron studies unremarkable. Could be 2/2 anemia of chronic disease. Transfuse as needed.  4. Acute CVA: Work up per primary, felt to be due to uncontrolled HTN.  5. HTN: BP elevated around 167/67-186/88. Currently on amlodipine 5 mg daily and  losartan increased to 100 mg daily, and on  Hctz. Chlorthalidone is a better thiazide to use but would require more tablets. Losartan was increased to  100 mg daily today. Still room to go with  amlodipine if still elevated.  6. DM: On SSI.  7. Tobacco use: per primary 8. Schizophrenia: per primary   Subjective:   Patient reports feeling well today, denies any new complaints.  Feels like his weakness has improved.  Reports urinating with no issues, denies dysuria, hematuria, fevers, chills, nausea, vomiting, headaches, or other complaints.   Objective:   BP (!) 181/94 (BP Location: Right Arm)   Pulse 96   Temp 98.8 F (37.1 C) (Oral)   Resp 18   Ht 6\' 2"  (1.88 m)   Wt 98.1 kg   SpO2 100%   BMI 27.77 kg/m   Intake/Output Summary (Last 24 hours) at 12/04/2020 0914 Last data filed at 12/04/2020 0002 Gross per 24 hour  Intake 240 ml  Output 900 ml  Net -660 ml   Weight change:   Physical Exam: General: [name], no acute distress, lying in bed Cardiac: Regular rate and rhythm, no murmurs rubs or gallops Pulmonary: CTA BL, no wheezing, rhonchi, rales Abdomen: Soft, nontender, nondistended Extremity: No lower extremity edema, warm, well-perfused  Imaging: MR ANGIO HEAD WO CONTRAST  Result Date: 12/02/2020 CLINICAL DATA:  Follow-up examination for acute stroke. EXAM: MRA NECK WITHOUT CONTRAST MRA HEAD WITHOUT CONTRAST TECHNIQUE: Multiplanar and multiecho pulse sequences of the neck were obtained without intravenous contrast. Angiographic images of the neck were obtained using MRA technique without intravenous contrast; Angiographic images of the Circle of Willis were obtained using MRA technique without intravenous contrast. COMPARISON:  Previous brain MRI from 12/02/2020. FINDINGS: MRA NECK FINDINGS AORTIC  ARCH: Visualized aortic arch of normal caliber with normal branch pattern. No hemodynamically significant stenosis or other abnormality seen about the origin of the great vessels. RIGHT CAROTID SYSTEM: Right common and internal carotid arteries patent without stenosis, evidence for dissection, or occlusion. No significant  atheromatous irregularity or narrowing seen about the right carotid bifurcation. LEFT CAROTID SYSTEM: Left common and internal carotid arteries widely patent without stenosis, evidence for dissection or occlusion. No significant atheromatous narrowing or irregularity about the left carotid bifurcation. VERTEBRAL ARTERIES: Origins of the vertebral arteries not well assessed on this noncontrast examination. Left vertebral artery originates low, possibly directly from the aortic arch or proximal subclavian artery. Right vertebral artery slightly dominant. Visualized portions of the vertebral arteries are widely patent without stenosis, dissection or occlusion. MRA HEAD FINDINGS ANTERIOR CIRCULATION: Visualized distal cervical segments of the internal carotid arteries are widely patent with symmetric antegrade flow. Petrous, cavernous, and supraclinoid segments widely patent without stenosis or other abnormality. A1 segments widely patent. Normal anterior communicating artery complex. Anterior cerebral arteries widely patent without stenosis. No M1 stenosis or occlusion. Negative MCA bifurcations. Distal MCA branches well perfused and symmetric. POSTERIOR CIRCULATION: Both vertebral arteries widely patent to the vertebrobasilar junction without stenosis. Both PICA patent. Basilar widely patent to its distal aspect without stenosis. Superior cerebral arteries patent bilaterally. Both PCAs well perfused to their distal aspects without stenosis. Small bilateral posterior communicating arteries noted. No aneurysm or other vascular abnormality. IMPRESSION: Normal MRA of the head and neck. No large vessel occlusion, hemodynamically significant stenosis, or other acute vascular abnormality. Electronically Signed   By: Rise Mu M.D.   On: 12/02/2020 20:15   MR ANGIO NECK WO CONTRAST  Result Date: 12/02/2020 CLINICAL DATA:  Follow-up examination for acute stroke. EXAM: MRA NECK WITHOUT CONTRAST MRA HEAD WITHOUT  CONTRAST TECHNIQUE: Multiplanar and multiecho pulse sequences of the neck were obtained without intravenous contrast. Angiographic images of the neck were obtained using MRA technique without intravenous contrast; Angiographic images of the Circle of Willis were obtained using MRA technique without intravenous contrast. COMPARISON:  Previous brain MRI from 12/02/2020. FINDINGS: MRA NECK FINDINGS AORTIC ARCH: Visualized aortic arch of normal caliber with normal branch pattern. No hemodynamically significant stenosis or other abnormality seen about the origin of the great vessels. RIGHT CAROTID SYSTEM: Right common and internal carotid arteries patent without stenosis, evidence for dissection, or occlusion. No significant atheromatous irregularity or narrowing seen about the right carotid bifurcation. LEFT CAROTID SYSTEM: Left common and internal carotid arteries widely patent without stenosis, evidence for dissection or occlusion. No significant atheromatous narrowing or irregularity about the left carotid bifurcation. VERTEBRAL ARTERIES: Origins of the vertebral arteries not well assessed on this noncontrast examination. Left vertebral artery originates low, possibly directly from the aortic arch or proximal subclavian artery. Right vertebral artery slightly dominant. Visualized portions of the vertebral arteries are widely patent without stenosis, dissection or occlusion. MRA HEAD FINDINGS ANTERIOR CIRCULATION: Visualized distal cervical segments of the internal carotid arteries are widely patent with symmetric antegrade flow. Petrous, cavernous, and supraclinoid segments widely patent without stenosis or other abnormality. A1 segments widely patent. Normal anterior communicating artery complex. Anterior cerebral arteries widely patent without stenosis. No M1 stenosis or occlusion. Negative MCA bifurcations. Distal MCA branches well perfused and symmetric. POSTERIOR CIRCULATION: Both vertebral arteries widely  patent to the vertebrobasilar junction without stenosis. Both PICA patent. Basilar widely patent to its distal aspect without stenosis. Superior cerebral arteries patent bilaterally. Both PCAs well perfused to their distal  aspects without stenosis. Small bilateral posterior communicating arteries noted. No aneurysm or other vascular abnormality. IMPRESSION: Normal MRA of the head and neck. No large vessel occlusion, hemodynamically significant stenosis, or other acute vascular abnormality. Electronically Signed   By: Rise Mu M.D.   On: 12/02/2020 20:15   MR BRAIN WO CONTRAST  Result Date: 12/02/2020 CLINICAL DATA:  Left-sided weakness and facial droop with abnormal CT EXAM: MRI HEAD WITHOUT CONTRAST TECHNIQUE: Multiplanar, multiecho pulse sequences of the brain and surrounding structures were obtained without intravenous contrast. COMPARISON:  None. FINDINGS: Brain: There is restricted diffusion extending from the right lentiform nucleus to the caudate body with involvement of intervening white matter. Additional much milder diffusion hyperintensity with ADC isointensity along the right caudate head. No evidence of intracranial hemorrhage. There is no intracranial mass or significant mass effect. There is no hydrocephalus or extra-axial fluid collection. Ventricles and sulci are normal in size and configuration. Minimal patchy T2 hyperintensity in the supratentorial white matter is nonspecific but may reflect minor chronic microvascular ischemic changes. Vascular: Major vessel flow voids at the skull base are preserved. Skull and upper cervical spine: Normal marrow signal is preserved. Sinuses/Orbits: Mild polypoid mucosal thickening. Orbits are unremarkable. Other: Sella is unremarkable.  Mastoid air cells are clear. IMPRESSION: Acute infarction involving the right basal ganglia and adjacent white matter. More subacute infarct of the right caudate head. No hemorrhage. Electronically Signed   By:  Guadlupe Spanish M.D.   On: 12/02/2020 10:11   VAS [place] TRANSCRANIAL DOPPLER W BUBBLES  Result Date: 12/03/2020  Transcranial Doppler with Bubble Indications: Stroke. Comparison Study: No prior study Performing Technologist: Jean Rosenthal, RDMS  Examination Guidelines: A complete evaluation includes B-mode imaging, spectral Doppler, color Doppler, and power Doppler as needed of all accessible portions of each vessel. Bilateral testing is considered an integral part of a complete examination. Limited examinations for reoccurring indications may be performed as noted.  Summary: No HITS at rest or during Valsalva. Negative transcranial Doppler Bubble study with no evidence of right to left intracardiac communication.  A vascular evaluation was performed. The left middle cerebral artery was studied. An IV was inserted into the patient's left forearm. Verbal informed consent was obtained.  *See table(s) above for TCD measurements and observations.    Preliminary    VAS [place] ABI WITH/WO TBI  Result Date: 12/03/2020 LOWER EXTREMITY DOPPLER STUDY High Risk Factors: Hypertension, Diabetes, current smoker, prior CVA.  Comparison Study: No previous exam Performing Technologist: Clint Guy RVT  Examination Guidelines: A complete evaluation includes at minimum, Doppler waveform signals and systolic blood pressure reading at the level of bilateral brachial, anterior tibial, and posterior tibial arteries, when vessel segments are accessible. Bilateral testing is considered an integral part of a complete examination. Photoelectric Plethysmograph (PPG) waveforms and toe systolic pressure readings are included as required and additional duplex testing as needed. Limited examinations for reoccurring indications may be performed as noted.  ABI Findings: +--------+------------------+-----+---------+--------+ Right   Rt Pressure (mmHg)IndexWaveform Comment  +--------+------------------+-----+---------+--------+ [id number]                     triphasic         +--------+------------------+-----+---------+--------+ PTA     161               0.75 biphasic          +--------+------------------+-----+---------+--------+ PERO    156               0.73 biphasic          +--------+------------------+-----+---------+--------+ +--------+------------------+-----+---------+-------+  Left    Lt Pressure (mmHg)IndexWaveform Comment +--------+------------------+-----+---------+-------+ Brachial205                    triphasic        +--------+------------------+-----+---------+-------+ PTA     234               1.09 triphasic        +--------+------------------+-----+---------+-------+ PERO    239               1.11 triphasic        +--------+------------------+-----+---------+-------+ +-------+-----------+-----------+------------+------------+ ABI/TBIToday's ABIToday's TBIPrevious ABIPrevious TBI +-------+-----------+-----------+------------+------------+ Right  0.75                                           +-------+-----------+-----------+------------+------------+ Left   1.11                                           +-------+-----------+-----------+------------+------------+  Summary: Right: Resting right ankle-brachial index indicates moderate right lower extremity arterial disease. Left: Resting left ankle-brachial index is within normal range. No evidence of significant left lower extremity arterial disease.  *See table(s) above for measurements and observations.  Electronically signed by Lemar Livings MD on 12/03/2020 at 3:13:59 PM.   Final    ECHOCARDIOGRAM COMPLETE  Result Date: 12/02/2020    ECHOCARDIOGRAM REPORT   Patient Name:   Rodney Rose Date of Exam: 12/02/2020 Medical Rec #:  [id number]        Height:       74.0 in Accession #:    [id number]       Weight:       220.0 lb Date of Birth:  1976-05-14        BSA:          2.263 m Patient Age:    43 years         BP:            199/96 mmHg Patient Gender: M                HR:           90 bpm. Exam Location:  Inpatient Procedure: 2D Echo, Cardiac Doppler and Color Doppler Indications:    Stroke 434.91 / I163.9  History:        Patient has no prior history of Echocardiogram examinations.                 Risk Factors:Hypertension and Current Smoker.  Sonographer:    Renella Cunas RDCS Referring Phys: [id number] JON KNAPP IMPRESSIONS  1. Left ventricular ejection fraction, by estimation, is 50 to 55%. The left ventricle has low normal function. The left ventricle demonstrates global hypokinesis. There is mild concentric left ventricular hypertrophy. Left ventricular diastolic parameters are consistent with Grade II diastolic dysfunction (pseudonormalization). Elevated left atrial pressure.  2. Right ventricular systolic function is normal. The right ventricular size is normal. Tricuspid regurgitation signal is inadequate for assessing PA pressure.  3. Left atrial size was mildly dilated.  4. The mitral valve is normal in structure. Mild to moderate mitral valve regurgitation.  5. The aortic valve is tricuspid. Aortic valve regurgitation is not visualized. No aortic stenosis is present. Conclusion(s)/Recommendation(s): No intracardiac source of embolism detected on this transthoracic study.  A transesophageal echocardiogram is recommended to exclude cardiac source of embolism if clinically indicated. MR severity may be underestimated. Consider TEE for that indication also. FINDINGS  Left Ventricle: Left ventricular ejection fraction, by estimation, is 50 to 55%. The left ventricle has low normal function. The left ventricle demonstrates global hypokinesis. The left ventricular internal cavity size was normal in size. There is mild concentric left ventricular hypertrophy. Left ventricular diastolic parameters are consistent with Grade II diastolic dysfunction (pseudonormalization). Elevated left atrial pressure. Right Ventricle: The right ventricular  size is normal. No increase in right ventricular wall thickness. Right ventricular systolic function is normal. Tricuspid regurgitation signal is inadequate for assessing PA pressure. Left Atrium: Left atrial size was mildly dilated. Right Atrium: Right atrial size was normal in size. Pericardium: There is no evidence of pericardial effusion. Mitral Valve: The mitral valve is normal in structure. Mild to moderate mitral valve regurgitation, with eccentric posteriorly directed jet. Tricuspid Valve: The tricuspid valve is normal in structure. Tricuspid valve regurgitation is not demonstrated. Aortic Valve: The aortic valve is tricuspid. Aortic valve regurgitation is not visualized. No aortic stenosis is present. Pulmonic Valve: The pulmonic valve was grossly normal. Pulmonic valve regurgitation is not visualized. Aorta: The aortic root and ascending aorta are structurally normal, with no evidence of dilitation. IAS/Shunts: No atrial level shunt detected by color flow Doppler.  LEFT VENTRICLE PLAX 2D LVIDd:         5.40 cm      Diastology LVIDs:         3.90 cm      LV e' medial:    4.38 cm/s LV PW:         1.30 cm      LV E/e' medial:  31.1 LV IVS:        1.30 cm      LV e' lateral:   6.81 cm/s LVOT diam:     2.40 cm      LV E/e' lateral: 20.0 LV SV:         83 LV SV Index:   37 LVOT Area:     4.52 cm  LV Volumes (MOD) LV vol d, MOD A2C: 161.0 ml LV vol d, MOD A4C: 155.0 ml LV vol s, MOD A2C: 79.0 ml LV vol s, MOD A4C: 76.8 ml LV SV MOD A2C:     82.0 ml LV SV MOD A4C:     155.0 ml LV SV MOD BP:      79.7 ml RIGHT VENTRICLE RV S prime:     10.00 cm/s TAPSE (M-mode): 2.1 cm LEFT ATRIUM             Index       RIGHT ATRIUM           Index LA diam:        3.80 cm 1.68 cm/m  RA Area:     13.80 cm LA Vol (A2C):   44.6 ml 19.70 ml/m RA Volume:   35.90 ml  15.86 ml/m LA Vol (A4C):   51.7 ml 22.84 ml/m LA Biplane Vol: 52.2 ml 23.06 ml/m  AORTIC VALVE LVOT Vmax:   103.00 cm/s LVOT Vmean:  71.200 cm/s LVOT VTI:    0.184 m   AORTA Ao Root diam: 3.70 cm Ao Asc diam:  3.10 cm MITRAL VALVE MV Area (PHT): 4.29 cm     SHUNTS MV Decel Time: 177 msec     Systemic VTI:  0.18 m MR Peak grad: 129.3 mmHg  Systemic Diam: 2.40 cm MR Vmax:      568.50 cm/s MV E velocity: 136.00 cm/s MV A velocity: 71.10 cm/s MV E/A ratio:  1.91 Mihai Croitoru MD Electronically signed by Thurmon Fair MD Signature Date/Time: 12/02/2020/4:06:33 PM    Final    VAS US CAROTID (at [facility] and [facility] only)  Result Date: 12/03/2020 Carotid Arterial Duplex Study Indications:       CVA. Risk Factors:      Hypertension, Diabetes, current smoker. Comparison Study:  No previous exam Performing Technologist: Clint Guy  Examination Guidelines: A complete evaluation includes B-mode imaging, spectral Doppler, color Doppler, and power Doppler as needed of all accessible portions of each vessel. Bilateral testing is considered an integral part of a complete examination. Limited examinations for reoccurring indications may be performed as noted.  Right Carotid Findings: +----------+--------+--------+--------+------------------+--------+           PSV cm/sEDV cm/sStenosisPlaque DescriptionComments +----------+--------+--------+--------+------------------+--------+ CCA Prox  101     18                                         +----------+--------+--------+--------+------------------+--------+ CCA Distal82      18                                         +----------+--------+--------+--------+------------------+--------+ ICA Prox  88      28      1-39%   heterogenous               +----------+--------+--------+--------+------------------+--------+ ICA Distal86      29                                         +----------+--------+--------+--------+------------------+--------+ ECA       130     13                                         +----------+--------+--------+--------+------------------+--------+  +----------+--------+-------+----------------+-------------------+           PSV cm/sEDV cmsDescribe        Arm Pressure (mmHG) +----------+--------+-------+----------------+-------------------+ Subclavian140            Multiphasic, WNL                    +----------+--------+-------+----------------+-------------------+ +---------+--------+--+--------+--+---------+ VertebralPSV cm/s49EDV cm/s17Antegrade +---------+--------+--+--------+--+---------+  Left Carotid Findings: +----------+--------+--------+--------+------------------+--------+           PSV cm/sEDV cm/sStenosisPlaque DescriptionComments +----------+--------+--------+--------+------------------+--------+ CCA Prox  161     23                                         +----------+--------+--------+--------+------------------+--------+ CCA Distal96      21                                         +----------+--------+--------+--------+------------------+--------+ ICA Prox  77      22      1-39%   heterogenous               +----------+--------+--------+--------+------------------+--------+  ICA Distal103     21                                         +----------+--------+--------+--------+------------------+--------+ ECA       115     14                                         +----------+--------+--------+--------+------------------+--------+ +----------+--------+--------+----------------+-------------------+           PSV cm/sEDV cm/sDescribe        Arm Pressure (mmHG) +----------+--------+--------+----------------+-------------------+ [id number]             Multiphasic, WNL                    +----------+--------+--------+----------------+-------------------+ +---------+--------+--+--------+--+---------+ VertebralPSV cm/s50EDV cm/s18Antegrade +---------+--------+--+--------+--+---------+   Summary: Right Carotid: Velocities in the right ICA are consistent with a 1-39%  stenosis. Left Carotid: Velocities in the left ICA are consistent with a 1-39% stenosis. Vertebrals:  Bilateral vertebral arteries demonstrate antegrade flow. Subclavians: Normal flow hemodynamics were seen in bilateral subclavian              arteries. *See table(s) above for measurements and observations.  Electronically signed by Delia HeadyPramod Sethi MD on 12/03/2020 at 1:18:51 PM.    Final    VAS US LOWER EXTREMITY VENOUS (DVT)  Result Date: 12/03/2020  Lower Venous DVT Study Indications: Stroke.  Comparison Study: 11-15-2018 Lower RT venous study available. Performing Technologist: Jean Rosenthalachel Hodge RDMS  Examination Guidelines: A complete evaluation includes B-mode imaging, spectral Doppler, color Doppler, and power Doppler as needed of all accessible portions of each vessel. Bilateral testing is considered an integral part of a complete examination. Limited examinations for reoccurring indications may be performed as noted. The reflux portion of the exam is performed with the patient in reverse Trendelenburg.  +---------+---------------+---------+-----------+----------+--------------+ RIGHT    CompressibilityPhasicitySpontaneityPropertiesThrombus Aging +---------+---------------+---------+-----------+----------+--------------+ CFV      Full           Yes      Yes                                 +---------+---------------+---------+-----------+----------+--------------+ SFJ      Full                                                        +---------+---------------+---------+-----------+----------+--------------+ FV Prox  Full                                                        +---------+---------------+---------+-----------+----------+--------------+ FV Mid   Full                                                        +---------+---------------+---------+-----------+----------+--------------+ FV DistalFull                                                         +---------+---------------+---------+-----------+----------+--------------+  PFV      Full                                                        +---------+---------------+---------+-----------+----------+--------------+ POP      Full           Yes      Yes                                 +---------+---------------+---------+-----------+----------+--------------+ PTV      Full                                                        +---------+---------------+---------+-----------+----------+--------------+ PERO     Full                                                        +---------+---------------+---------+-----------+----------+--------------+   +---------+---------------+---------+-----------+----------+--------------+ LEFT     CompressibilityPhasicitySpontaneityPropertiesThrombus Aging +---------+---------------+---------+-----------+----------+--------------+ CFV      Full           Yes      Yes                                 +---------+---------------+---------+-----------+----------+--------------+ SFJ      Full                                                        +---------+---------------+---------+-----------+----------+--------------+ FV Prox  Full                                                        +---------+---------------+---------+-----------+----------+--------------+ FV Mid   Full                                                        +---------+---------------+---------+-----------+----------+--------------+ FV DistalFull                                                        +---------+---------------+---------+-----------+----------+--------------+ PFV      Full                                                        +---------+---------------+---------+-----------+----------+--------------+  POP      Full           Yes      Yes                                  +---------+---------------+---------+-----------+----------+--------------+ PTV      Full                                                        +---------+---------------+---------+-----------+----------+--------------+ PERO     Full                                                        +---------+---------------+---------+-----------+----------+--------------+     Summary: RIGHT: - There is no evidence of deep vein thrombosis in the lower extremity.  - No cystic structure found in the popliteal fossa.  LEFT: - There is no evidence of deep vein thrombosis in the lower extremity.  - No cystic structure found in the popliteal fossa.  *See table(s) above for measurements and observations. Electronically signed by Lemar Livings MD on 12/03/2020 at 4:51:46 PM.    Final     Labs: BMET Recent Labs  Lab 12/01/20 1624 12/03/20 0655 12/04/20 0633  NA 143 144 145  K 3.2* 3.1* 3.6  CL 110 110 112*  CO2 GLUCOSE 122* 122* 120*  BUN 18 17 23*  CREATININE 2.04* 1.98* 2.15*  CALCIUM 8.4* 8.7* 8.9   CBC Recent Labs  Lab 12/01/20 1624 12/03/20 0655 12/04/20 0633  WBC 10.3 11.0* 10.1  NEUTROABS  --  8.3*  --   HGB 8.9* 9.6* 9.3*  HCT 29.0* 30.8* 30.5*  MCV 81.5 79.0* 80.1  PLT 269 308 294    Medications:    . amLODipine  5 mg Oral Daily  . aspirin EC  81 mg Oral Daily  . atorvastatin  80 mg Oral Daily  . clopidogrel  75 mg Oral Daily  . enoxaparin (LOVENOX) injection  40 mg Subcutaneous Q24H  . haloperidol  5 mg Oral QHS  . losartan  100 mg Oral Daily   And  . hydrochlorothiazide  12.5 mg Oral Daily  . insulin aspart  0-15 Units Subcutaneous TID WC  . insulin aspart  0-5 Units Subcutaneous QHS    Claudean Severance, M.D. [facility] 12/04/2020 9:14 AM

## 2020-12-04 NOTE — Progress Notes (Signed)
    [facility] has been requested to perform a transesophageal echocardiogram on 12/05/20 for Stroke.  After careful review of history and examination, the risks and benefits of transesophageal echocardiogram have been explained including risks of esophageal damage, perforation (1:10,000 risk), bleeding, pharyngeal hematoma as well as other potential complications associated with conscious sedation including aspiration, arrhythmia, respiratory failure and death. Alternatives to treatment were discussed, questions were answered. Patient is willing to proceed. Labs and vital signs stable at the time of consent.   Laverda Page, NP-C 12/04/2020 4:06 PM

## 2020-12-04 NOTE — Progress Notes (Signed)
Consent for TEE signed and in the chart

## 2020-12-04 NOTE — Anesthesia Preprocedure Evaluation (Signed)
Anesthesia Evaluation  Patient identified by MRN, date of birth, ID band Patient awake    Reviewed: Allergy & Precautions, NPO status , Patient's Chart, lab work & pertinent test results  Airway Mallampati: III  TM Distance: >3 FB Neck ROM: Full    Dental  (+) Poor Dentition, Missing, Chipped,    Pulmonary Current Smoker and Patient abstained from smoking.,    Pulmonary exam normal breath sounds clear to auscultation       Cardiovascular hypertension, Pt. on medications Normal cardiovascular exam Rhythm:Regular Rate:Normal     Neuro/Psych PSYCHIATRIC DISORDERS Bipolar Disorder Schizophrenia CVA    GI/Hepatic negative GI ROS, Neg liver ROS,   Endo/Other  diabetes, Insulin Dependent, Oral Hypoglycemic Agents  Renal/GU CRF and ARFRenal disease     Musculoskeletal negative musculoskeletal ROS (+)   Abdominal   Peds  Hematology  (+) anemia ,   Anesthesia Other Findings STROKE  Reproductive/Obstetrics                            Anesthesia Physical Anesthesia Plan  ASA: III  Anesthesia Plan: MAC   Post-op Pain Management:    Induction: Intravenous  PONV Risk Score and Plan: 0 and Propofol infusion and Treatment may vary due to age or medical condition  Airway Management Planned: Nasal Cannula  Additional Equipment:   Intra-op Plan:   Post-operative Plan:   Informed Consent: I have reviewed the patients History and Physical, chart, labs and discussed the procedure including the risks, benefits and alternatives for the proposed anesthesia with the patient or authorized representative who has indicated his/her understanding and acceptance.     Dental advisory given  Plan Discussed with: CRNA  Anesthesia Plan Comments:        Anesthesia Quick Evaluation

## 2020-12-04 NOTE — Progress Notes (Signed)
Physical Therapy Treatment Patient Details Name: Rodney Rose MRN: [id number] DOB: 07/21/1976 Today's Date: 12/04/2020    History of Present Illness Pt is a 44 year old male who presented with L sided weakness, L facial droop, and slurred speech that began around 4 days prior to arrival. 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 PMH: schizophrenia, HTN, chronic back pain, and bipolar disorder.    PT Comments    Pt demonstrates improved overall balance and confidence with mobility this date. He required only min guard assist for ambulating without an AD/AE and with stair negotiation this date. However, he easily loses balance with perturbation during gait. He demonstrates decreased strength and balance on his L leg through a decreased weight shift and stance time on his L compared to his R, resulting in mid-foot instead of heel-strike at initial contact on R. Cued pt to correct with mod success. Educated pt on performing SLR and bridges in bed to increase leg strength. Will continue to follow acutely. Current recommendations remain appropriate.   Follow Up Recommendations  Outpatient PT;Supervision for mobility/OOB (neuro specialty clinic)     Equipment Recommendations  None recommended by PT    Recommendations for Other Services       Precautions / Restrictions Precautions Precautions: Fall Precaution Comments: HTN Restrictions Weight Bearing Restrictions: No    Mobility  Bed Mobility Overal bed mobility: Independent             General bed mobility comments: Pt able to perform all bed mob independently safely.  Transfers Overall transfer level: Needs assistance Equipment used: None Transfers: Sit to/from Stand Sit to Stand: Supervision         General transfer comment: Supervision for safety, pt able to power up to stand in appropriate time frame.  Ambulation/Gait Ambulation/Gait assistance: Min guard;Min  assist Gait Distance (Feet): 225 Feet Assistive device: None Gait Pattern/deviations: Step-through pattern;Decreased stride length;Narrow base of support;Decreased step length - right;Decreased stance time - left;Decreased weight shift to left Gait velocity: decreased Gait velocity interpretation: 1.31 - 2.62 ft/sec, indicative of limited community ambulator General Gait Details: Pt with improved step length this date but displays initial contact with heel on L foot but mid-foot on R due to decreased weight shift and stance time on L leg. Provided tactile, verbal, and visual cues to correct but pt easily loses balance with tactile cues for weight shifting, resulting in minA ot recover. Otherwise, min guard for all gait this date.   Stairs Stairs: Yes Stairs assistance: Min guard Stair Management: Alternating pattern;Step to pattern;One rail Left;One rail Right Number of Stairs: 8 General stair comments: Ascending with L rail and descending with R rail to simulate home set-up. Cued pt to lead up with R and down with L with step-to pattern for safe mobility at home. However, progressed to reciprocal to encourage natural pattern and leg strengthening. No LOB this date, min guard for safety.   Wheelchair Mobility    Modified Rankin (Stroke Patients Only) Modified Rankin (Stroke Patients Only) Pre-Morbid Rankin Score: No symptoms Modified Rankin: Moderately severe disability     Balance Overall balance assessment: Needs assistance Sitting-balance support: Feet supported Sitting balance-Leahy Scale: Good Sitting balance - Comments: Sitting EOB no LOB with supervision for safety.   Standing balance support: No upper extremity supported;During functional activity Standing balance-Leahy Scale: Fair Standing balance comment: Ambulates with no UE support, but intermittent moments of LOB when provided a perturbation while ambulating.  Cognition  Arousal/Alertness: Awake/alert Behavior During Therapy: WFL for tasks assessed/performed Overall Cognitive Status: Impaired/Different from baseline Area of Impairment: Safety/judgement                         Safety/Judgement: Decreased awareness of deficits     General Comments: A&Ox4. Pt requires cues to acknowledge deficits and correct to maintain safety. Pt tends to bump obstacles without injury on L side and requires cues to scan and pay attention to L side and L arm with mobility.      Exercises General Exercises - Lower Extremity Gluteal Sets: AROM;Strengthening;5 reps;Both;Supine (bridging and hold 3 seconds in bed) Straight Leg Raises: AROM;Strengthening;Both;10 reps;Supine    General Comments        Pertinent Vitals/Pain Pain Assessment: Faces Faces Pain Scale: No hurt Pain Intervention(s): Limited activity within patient's tolerance;Monitored during session;Repositioned    Home Living                      Prior Function            PT Goals (current goals can now be found in the care plan section) Acute Rehab PT Goals Patient Stated Goal: to get better PT Goal Formulation: With patient Time For Goal Achievement: 12/17/20 Potential to Achieve Goals: Good Progress towards PT goals: Progressing toward goals    Frequency    Min 3X/week      PT Plan Current plan remains appropriate    Co-evaluation              AM-PAC PT "6 Clicks" Mobility   Outcome Measure  Help needed turning from your back to your side while in a flat bed without using bedrails?: None Help needed moving from lying on your back to sitting on the side of a flat bed without using bedrails?: None Help needed moving to and from a bed to a chair (including a wheelchair)?: A Little Help needed standing up from a chair using your arms (e.g., wheelchair or bedside chair)?: A Little Help needed to walk in hospital room?: A Little Help needed climbing 3-5 steps with a  railing? : A Little 6 Click Score: 20    End of Session Equipment Utilized During Treatment: Gait belt Activity Tolerance: Patient tolerated treatment well Patient left: with call bell/phone within reach;in bed;with bed alarm set   PT Visit Diagnosis: Unsteadiness on feet (R26.81);Other abnormalities of gait and mobility (R26.89);Muscle weakness (generalized) (M62.81);Other symptoms and signs involving the nervous system (R29.898)     Time: 0454-0981 PT Time Calculation (min) (ACUTE ONLY): 13 min  Charges:  $Gait Training: 8-22 mins                     Raymond Gurney, PT, DPT Acute Rehabilitation Services  Pager: [phone] Office: [phone]    Jewel Baize 12/04/2020, 5:00 PM

## 2020-12-04 NOTE — H&P (View-Only) (Signed)
STROKE TEAM PROGRESS NOTE     INTERVAL HISTORY Patient is sitting up in bed.  States is doing better.  Is scheduled for TEE tomorrow.  Transcranial Doppler bubble study done yesterday was negative for PFO.  Therapist recommend outpatient PT OT.  Vital signs are stable.  Neurological exam is unchanged.  Sickle cell screen is negative.  ANA is negative.  ESR is slightly elevated at 40 mg.  Nephrology consult appreciated.  Antiphospholipid antibody test is pending    OBJECTIVE Vitals:   12/04/20 0500 12/04/20 0855 12/04/20 1015 12/04/20 1216  BP:  (!) 181/94 (!) 176/75 (!) 175/83  Pulse:  96  99  Resp:  18  18  Temp:  98.8 F (37.1 C)  98.8 F (37.1 C)  TempSrc:  Oral  Oral  SpO2:  100%  99%  Weight: 98.1 kg     Height:        CBC:  Recent Labs  Lab 12/03/20 0655 12/04/20 0633  WBC 11.0* 10.1  NEUTROABS 8.3*  --   HGB 9.6* 9.3*  HCT 30.8* 30.5*  MCV 79.0* 80.1  PLT 308 294    Basic Metabolic Panel:  Recent Labs  Lab 12/03/20 0655 12/04/20 0633  NA 144 145  K 3.1* 3.6  CL 110 112*  CO2 22 22  GLUCOSE 122* 120*  BUN 17 23*  CREATININE 1.98* 2.15*  CALCIUM 8.7* 8.9    Lipid Panel:     Component Value Date/Time   CHOL 219 (H) 12/03/2020 0111   CHOL 245 (H) 11/02/2018 0925   TRIG 99 12/03/2020 0111   HDL 28 (L) 12/03/2020 0111   HDL 28 (L) 11/02/2018 0925   CHOLHDL 7.8 12/03/2020 0111   VLDL 20 12/03/2020 0111   LDLCALC 171 (H) 12/03/2020 0111   LDLCALC 155 (H) 11/02/2018 0925   HgbA1c:  Lab Results  Component Value Date   HGBA1C 5.5 12/03/2020   Urine Drug Screen:     Component Value Date/Time   LABOPIA NONE DETECTED 12/02/2020 0901   COCAINSCRNUR NONE DETECTED 12/02/2020 0901   LABBENZ NONE DETECTED 12/02/2020 0901   AMPHETMU NONE DETECTED 12/02/2020 0901   THCU NONE DETECTED 12/02/2020 0901   LABBARB NONE DETECTED 12/02/2020 0901    Alcohol Level No results found for: ETH  IMAGING  CT HEAD WO CONTRAST 12/01/2020 IMPRESSION:  1. Chronic  appearing right basal ganglia infarct.  2. Very mild proximal bilateral ethmoid sinus mucosal thickening versus polyp/mucous retention cyst.   MR ANGIO HEAD WO CONTRAST MR ANGIO NECK WO CONTRAST 12/02/2020 IMPRESSION:  Normal MRA of the head and neck. No large vessel occlusion, hemodynamically significant stenosis, or other acute vascular abnormality.   MR BRAIN WO CONTRAST 12/02/2020 IMPRESSION:  Acute infarction involving the right basal ganglia and adjacent white matter. More subacute infarct of the right caudate head. No hemorrhage.  DG Chest Portable 1 View  12/02/2020 IMPRESSION:  Patchy airspace opacities in the left upper lobe and possibly right infrahilar region concerning for pneumonia.   VAS US ABI WITH/WO TBI 12/02/2020 Summary:  Right: Resting right ankle-brachial index indicates moderate right lower extremity arterial disease.  Left: Resting left ankle-brachial index is within normal range. No evidence of significant left lower extremity arterial disease.  Preliminary    ECHOCARDIOGRAM COMPLETE 12/02/2020 IMPRESSIONS   1. Left ventricular ejection fraction, by estimation, is 50 to 55%. The left ventricle has low normal function. The left ventricle demonstrates global hypokinesis. There is mild concentric left ventricular hypertrophy. Left ventricular diastolic   parameters are consistent with Grade II diastolic dysfunction (pseudonormalization). Elevated left atrial pressure.   2. Right ventricular systolic function is normal. The right ventricular size is normal. Tricuspid regurgitation signal is inadequate for assessing PA pressure.   3. Left atrial size was mildly dilated.   4. The mitral valve is normal in structure. Mild to moderate mitral valve regurgitation.  5. The aortic valve is tricuspid. Aortic valve regurgitation is not visualized. No aortic stenosis is present.   Conclusion(s)/Recommendation(s):  No intracardiac source of embolism detected on this  transthoracic study. A transesophageal echocardiogram is recommended to exclude cardiac source of embolism if clinically indicated. MR severity may be underestimated. Consider TEE for that indication also.   VAS US CAROTID (at [facility] and [facility] only) 12/02/2020 Summary:  Right Carotid: Velocities in the right ICA are consistent with a 1-39% stenosis.  Left Carotid: Velocities in the left ICA are consistent with a 1-39% stenosis.  Vertebrals:  Bilateral vertebral arteries demonstrate antegrade flow.  Subclavians: Normal flow hemodynamics were seen in bilateral subclavian arteries. Preliminary    ECG - SR rate 98 BPM. (See cardiology reading for complete details)  PHYSICAL EXAM Blood pressure (!) 175/83, pulse 99, temperature 98.8 F (37.1 C), temperature source Oral, resp. rate 18, height 6' 2" (1.88 m), weight 98.1 kg, SpO2 99 %. Pleasant middle-age African-American male not in distress. . Afebrile. Head is nontraumatic. Neck is supple without bruit.    Cardiac exam no murmur or gallop. Lungs are clear to auscultation. Distal pulses are well felt. Neurological Exam Awake alert oriented x 3 normal speech and language. Mild left lower face asymmetry. Tongue midline.  Mild left lower extremity drift. Mild diminished fine finger movements on left. Orbits right over left upper extremity. Mild left grip and hip flexor weakness.. Normal sensation . Normal coordination.  Gait deferred     ASSESSMENT/PLAN [name] with history of schizophrenia, bipolar disorder (Dr. Almenu with [facility]), tobacco use, DM, and HTN who presented to [facility] 12[facility]6 with c/o left sided weakness, facial droop, slurred speech and off balance gait since 11/27/20. He did not receive IV t-PA due to late presentation (>4.5 hours from time of onset)  Stroke: Acute infarction involving the right basal ganglia and adjacent white matter. More subacute infarct of the right caudate head. Likely  cryptogenic etiology  due to large size of his subcortical stroke  Resultant left hemiparesis  Code Stroke CT Head -   large right basal ganglia infarct of indeterminate age  CT head - Chronic appearing right basal ganglia infarct.   MRI head - Acute infarction involving the right basal ganglia and adjacent white matter. More subacute infarct of the right caudate head. No hemorrhage.  MRA H&N - Normal MRA of the head and neck. No large vessel occlusion, hemodynamically significant stenosis, or other acute vascular abnormality.   CTA H&N - not ordered  CT Perfusion - not ordered  Carotid Doppler - unremarkable  2D Echo - EF 60 - 65%. No cardiac source of emboli identified. MR severity may be underestimated.  Sars Corona Virus 2 - negative  LDL - 171  HgbA1c - 5.5  UDS - negative  VTE prophylaxis - Lovenox 40 mg daily Diet  Diet Order            Diet heart healthy/carb modified Room service appropriate? Yes; Fluid consistency: Thin  Diet effective now                   No antithrombotic prior to admission, now on aspirin 325 mg daily  Patient counseled to be compliant with his antithrombotic medications  Ongoing aggressive stroke risk factor management  Therapy recommendations:  pending  Disposition:  Pending  Hypertension  Home BP meds: amlodipine, Hyzaar  Current BP meds: amlodipine, Losartan, HCTZ  Stable . Permissive hypertension (OK if < 220/120) but gradually normalize in 5-7 days  . Long-term BP goal normotensive  Hyperlipidemia  Home Lipid lowering medication: none   LDL 171, goal < 70  Current lipid lowering medication: Lipitor 80 mg daily   Continue statin at discharge  Diabetes  Home diabetic meds: insulin ; metformin ; glipizide  Current diabetic meds: SSI   HgbA1c 5.5, goal < 7.0 Recent Labs    12/03/20 2140 12/04/20 0610 12/04/20 1207  GLUCAP 135* 112* 177*    Other Stroke Risk Factors  Cigarette smoker - advised to stop  smoking  Overweight, Body mass index is 27.77 kg/m., recommend weight loss, diet and exercise as appropriate   Other Active Problems  Code status - Full code  Anemia - Hgb - 8.9->9.6  Mild leukocytosis - WBC's - 11.0   (afebrile)   Hypokalemia - potassium - 3.1 - supplemented  CXR - Patchy airspace opacities in the left upper lobe and possibly right infrahilar region concerning for pneumonia.   BLE ABIs - moderate right lower extremity arterial disease  Mild to moderate mitral valve regurgitation by echo - (may be underestimated)  Acute vs chronic kidney disease - Creatinine - 1.98   Schizophrenia, bipolar disorder (Dr. Almenu with [facility])  Hospital day # 2  Patient presented with subacute left hemiparesis due to large right basal ganglia infarct likely of cryptogenic etiology.  MR angiogram shows no significant large vessel stenosis or occlusion.  Recommend further work-up by checking TEE, for cardiac source of embolism. Patient also appears to be at risk for sleep apnea but is declining participation in the sleep smart study. Recommend aspirin Plavix for 3 weeks followed by aspirin alone.  Aggressive risk factor management. Greater than 50% time during this 25-minute visit was spent on coordination of care and discussion with care team and answering questions.  Discussed with Dr. Carolyn Gilloud and her team and answered questions Lexxi Koslow, MD   To contact Stroke Continuity provider, please refer to [email or web address]. After hours, contact General Neurology 

## 2020-12-04 NOTE — Progress Notes (Addendum)
Subjective: HD#2  Rodney Rose states that he feels well. He has been collecting his urine. Denies any new symptoms. He explains his conversations had with nephrology and neurology well and does not have any questions at this time. He is aware of his follow up appointments with these services outpatient.   Objective:  Vital signs in last 24 hours: Vitals:   12/04/20 0500 12/04/20 0855 12/04/20 1015 12/04/20 1216  BP:  (!) 181/94 (!) 176/75 (!) 175/83  Pulse:  96  99  Resp:  18  18  Temp:  98.8 F (37.1 C)  98.8 F (37.1 C)  TempSrc:  Oral  Oral  SpO2:  100%  99%  Weight: 98.1 kg     Height:       CBC Latest Ref Rng & Units 12/04/2020 12/03/2020 12/01/2020  WBC 4.0 - 10.5 K/uL 10.1 11.0(H) 10.3  Hemoglobin 13.0 - 17.0 g/dL 1.7(B) 9.3(J) 8.9(L)  Hematocrit 39.0 - 52.0 % 30.5(L) 30.8(L) 29.0(L)  Platelets 150 - 400 K/uL 294 308 269   BMP Latest Ref Rng & Units 12/04/2020 12/03/2020 12/01/2020  Glucose 70 - 99 mg/dL 030(S) 923(R) 007(M)  BUN 6 - 20 mg/dL 22(Q) 17 18  Creatinine 0.61 - 1.24 mg/dL 3.33(L) 4.56(Y) 5.63(S)  BUN/Creat Ratio 9 - 20 - - -  Sodium 135 - 145 mmol/L 145 144 143  Potassium 3.5 - 5.1 mmol/L 3.6 3.1(L) 3.2(L)  Chloride 98 - 111 mmol/L 112(H) 110 110  CO2 22 - 32 mmol/L 22 22 24   Calcium 8.9 - 10.3 mg/dL 8.9 ) 9.3(T)   Physical exam: General: Well developed, well nourished man lying comfortably in bed, NAD CV: RRR, no m/r/g Pulmonary: CTAB Ext: Warm, dry, trace edema Neuro: AAO*3, 5/5 RUE & RLE strength, 4/5 LUE & LLU extremity strength, left sided facial droop. Psych: Pleasant mood and affect  Assessment/Plan: Rodney Rose is a 44 y/o M with PMHx of HTN, tobacco use disorder, type II DM, Bipolar Disorder, Schziophrenia who presents with left-sided weakness and slurred speech admitted post stroke and found to have Nephrotic syndrome 2/2 T2DM  Active Problems:   CVA (cerebral vascular accident) (HCC)   AKI (acute kidney injury) (HCC)   Acute on  chronic anemia  # Acute CVA  Acute infarction involving the right basal ganglia and adjacent white matter. More subacute infarct of the right caudate head. Likely cryptogenic etiology  due to large size of his subcortical stroke.  MR angiogram shows no significant large vessel stenosis or occlusion.  Recommend further work-up by checking TEE, for cardiac source of embolism. Carotid Doppler - unremarkable. Aspirin Plavix for 3 weeks followed by aspirin alone.  -Appreciate Neurology assistence -Aspirin 325 mg -Clopidogrel 75 mg -Tele monitoring -PT/OT eval   -F/U TEE  # Nephrotic Syndrome   Trace peripheral edema appreciated on exam today.Had ~1.2 L of urine output yesterday. Work up notable for AKI, proteinuria, and hypoalbuminemia . SCr~ 2.15, BUN 38, GFR 38. Protein/Creatinine ration 3.80, likely from diabetic nephropathy.  -Appreciate the Nephrology assistance. -F/U  Urine Protein 24 hr -C/W  Lipitor 80 mg -Increase Losartan to 100 mg  # Acute on Chronic Anemia: Hemoglobin 8.9 on admission down from 11.7 one year ago. He is asymptomatic. Denies melena / hematochezia. Iron studies are WNL. We are checking FOBT. Possible anemia of chronic disease.  Found to have worsening renal function this admission. May need outpatient colonoscopy for complete work up.   -F/U smear review -checking FOBT   # Hypertension #  Med-non compliance No need for permissive hypertension at this point as post stroke day 5.   -C/W Amlodipine 5mg  -C/W Losartan 100-Hydrochlorothiazide -PRN IV hydralazine   Type II DM   HbA1c is 5.5, was 11.1 in 2019   -on glipizide-metformin as an outpatient  -SSI     Prior to Admission Living Arrangement: Home Anticipated Discharge Location: Home Barriers to Discharge: Ongoing mangement Dispo: Anticipated discharge in approximately 1-2 day(s).   2020, MD 12/04/2020, 3:37 PM Pager: [phone] After 5pm on weekdays and 1pm on weekends: On Call pager  [phone]

## 2020-12-04 NOTE — TOC Initial Note (Signed)
Transition of Care [facility]) - Initial/Assessment Note    Patient Details  Name: Rodney Rose MRN: [id number] Date of Birth: 16-Apr-1976  Transition of Care [facility]) CM/SW Contact:    Kermit Balo, RN Phone Number: 12/04/2020, 4:14 PM  Clinical Narrative:                 Pt live with his mother and sister who can provide needed supervision at home.  Pt chose [facility] for his outpatient therapy. Orders in epic and information on the AVS. Pt denies issues with transportation. Pt without a PCP. CM inquired if [facility] internal med can see in the clinic.  TOC following.  Expected Discharge Plan: OP Rehab Barriers to Discharge: Continued Medical Work up   Patient Goals and CMS Choice     Choice offered to / list presented to : Patient  Expected Discharge Plan and Services Expected Discharge Plan: OP Rehab   Discharge Planning Services: CM Consult   Living arrangements for the past 2 months: Apartment                                      Prior Living Arrangements/Services Living arrangements for the past 2 months: Apartment Lives with:: Siblings,Parents Patient language and need for interpreter reviewed:: Yes Do you feel safe going back to the place where you live?: Yes      Need for Family Participation in Patient Care: Yes (Comment) Care giver support system in place?: Yes (comment)   Criminal Activity/Legal Involvement Pertinent to Current Situation/Hospitalization: No - Comment as needed  Activities of Daily Living      Permission Sought/Granted                  Emotional Assessment Appearance:: Appears stated age Attitude/Demeanor/Rapport: Engaged Affect (typically observed): Accepting Orientation: : Oriented to Self,Oriented to Place,Oriented to  Time,Oriented to Situation   Psych Involvement: No (comment)  Admission diagnosis:  PND (paroxysmal nocturnal dyspnea) [R06.00] CVA (cerebral vascular accident) (HCC) [I63.9] Cerebrovascular accident  (CVA), unspecified mechanism (HCC) [I63.9] Community acquired pneumonia, unspecified laterality [J18.9] Patient Active Problem List   Diagnosis Date Noted  . AKI (acute kidney injury) (HCC)   . Acute on chronic anemia   . CVA (cerebral vascular accident) (HCC) 12/02/2020  . Renal insufficiency 04/09/2019  . Right hip pain 11/15/2018  . Tachycardia 11/15/2018  . Calf swelling 11/15/2018  . Elevated blood sugar 11/03/2018  . Diabetes mellitus, new onset (HCC) 11/03/2018  . Uncontrolled diabetes mellitus (HCC) 11/03/2018  . Mixed dyslipidemia 11/03/2018  . Abnormal EKG 11/03/2018  . Essential hypertension, benign 11/02/2018  . Smoker 11/02/2018  . Bipolar disorder (HCC) 11/02/2018  . Edema 11/02/2018   PCP:  Patient, No Pcp Per Pharmacy:   [id number] - [place], [place] - [address] AT [address] [place] [address] Phone: [phone] Fax: [phone]     Social Determinants of Health (SDOH) Interventions    Readmission Risk Interventions No flowsheet data found.

## 2020-12-04 NOTE — Progress Notes (Signed)
STROKE TEAM PROGRESS NOTE     INTERVAL HISTORY Patient is sitting up in bed.  States is doing better.  Is scheduled for TEE tomorrow.  Transcranial Doppler bubble study done yesterday was negative for PFO.  Therapist recommend outpatient PT OT.  Vital signs are stable.  Neurological exam is unchanged.  Sickle cell screen is negative.  ANA is negative.  ESR is slightly elevated at 40 mg.  Nephrology consult appreciated.  Antiphospholipid antibody test is pending    OBJECTIVE Vitals:   12/04/20 0500 12/04/20 0855 12/04/20 1015 12/04/20 1216  BP:  (!) 181/94 (!) 176/75 (!) 175/83  Pulse:  96  99  Resp:  18  18  Temp:  98.8 F (37.1 C)  98.8 F (37.1 C)  TempSrc:  Oral  Oral  SpO2:  100%  99%  Weight: 98.1 kg     Height:        CBC:  Recent Labs  Lab 12/03/20 0655 12/04/20 0633  WBC 11.0* 10.1  NEUTROABS 8.3*  --   HGB 9.6* 9.3*  HCT 30.8* 30.5*  MCV 79.0* 80.1  PLT 308 440    Basic Metabolic Panel:  Recent Labs  Lab 12/03/20 0655 12/04/20 0633  NA 144 145  K 3.1* 3.6  CL 110 112*  CO2 22 22  GLUCOSE 122* 120*  BUN 17 23*  CREATININE 1.98* 2.15*  CALCIUM 8.7* 8.9    Lipid Panel:     Component Value Date/Time   CHOL 219 (H) 12/03/2020 0111   CHOL 245 (H) 11/02/2018 0925   TRIG 99 12/03/2020 0111   HDL 28 (L) 12/03/2020 0111   HDL 28 (L) 11/02/2018 0925   CHOLHDL 7.8 12/03/2020 0111   VLDL 20 12/03/2020 0111   LDLCALC 171 (H) 12/03/2020 0111   LDLCALC 155 (H) 11/02/2018 0925   HgbA1c:  Lab Results  Component Value Date   HGBA1C 5.5 12/03/2020   Urine Drug Screen:     Component Value Date/Time   LABOPIA NONE DETECTED 12/02/2020 0901   COCAINSCRNUR NONE DETECTED 12/02/2020 0901   LABBENZ NONE DETECTED 12/02/2020 0901   AMPHETMU NONE DETECTED 12/02/2020 0901   THCU NONE DETECTED 12/02/2020 0901   LABBARB NONE DETECTED 12/02/2020 0901    Alcohol Level No results found for: ETH  IMAGING  CT HEAD WO CONTRAST 12/01/2020 IMPRESSION:  1. Chronic  appearing right basal ganglia infarct.  2. Very mild proximal bilateral ethmoid sinus mucosal thickening versus polyp/mucous retention cyst.   MR ANGIO HEAD WO CONTRAST MR ANGIO NECK WO CONTRAST 12/02/2020 IMPRESSION:  Normal MRA of the head and neck. No large vessel occlusion, hemodynamically significant stenosis, or other acute vascular abnormality.   MR BRAIN WO CONTRAST 12/02/2020 IMPRESSION:  Acute infarction involving the right basal ganglia and adjacent white matter. More subacute infarct of the right caudate head. No hemorrhage.  DG Chest Portable 1 View  12/02/2020 IMPRESSION:  Patchy airspace opacities in the left upper lobe and possibly right infrahilar region concerning for pneumonia.   VAS [place] ABI WITH/WO TBI 12/02/2020 Summary:  Right: Resting right ankle-brachial index indicates moderate right lower extremity arterial disease.  Left: Resting left ankle-brachial index is within normal range. No evidence of significant left lower extremity arterial disease.  Preliminary    ECHOCARDIOGRAM COMPLETE 12/02/2020 IMPRESSIONS   1. Left ventricular ejection fraction, by estimation, is 50 to 55%. The left ventricle has low normal function. The left ventricle demonstrates global hypokinesis. There is mild concentric left ventricular hypertrophy. Left ventricular diastolic  parameters are consistent with Grade II diastolic dysfunction (pseudonormalization). Elevated left atrial pressure.   2. Right ventricular systolic function is normal. The right ventricular size is normal. Tricuspid regurgitation signal is inadequate for assessing PA pressure.   3. Left atrial size was mildly dilated.   4. The mitral valve is normal in structure. Mild to moderate mitral valve regurgitation.  5. The aortic valve is tricuspid. Aortic valve regurgitation is not visualized. No aortic stenosis is present.   Conclusion(s)/Recommendation(s):  No intracardiac source of embolism detected on this  transthoracic study. A transesophageal echocardiogram is recommended to exclude cardiac source of embolism if clinically indicated. MR severity may be underestimated. Consider TEE for that indication also.   VAS US CAROTID (at [facility] and [facility] only) 12/02/2020 Summary:  Right Carotid: Velocities in the right ICA are consistent with a 1-39% stenosis.  Left Carotid: Velocities in the left ICA are consistent with a 1-39% stenosis.  Vertebrals:  Bilateral vertebral arteries demonstrate antegrade flow.  Subclavians: Normal flow hemodynamics were seen in bilateral subclavian arteries. Preliminary    ECG - SR rate 98 BPM. (See cardiology reading for complete details)  PHYSICAL EXAM Blood pressure (!) 175/83, pulse 99, temperature 98.8 F (37.1 C), temperature source Oral, resp. rate 18, height 6' 2" (1.88 m), weight 98.1 kg, SpO2 99 %. Pleasant middle-age African-American male not in distress. . Afebrile. Head is nontraumatic. Neck is supple without bruit.    Cardiac exam no murmur or gallop. Lungs are clear to auscultation. Distal pulses are well felt. Neurological Exam Awake alert oriented x 3 normal speech and language. Mild left lower face asymmetry. Tongue midline.  Mild left lower extremity drift. Mild diminished fine finger movements on left. Orbits right over left upper extremity. Mild left grip and hip flexor weakness.. Normal sensation . Normal coordination.  Gait deferred     ASSESSMENT/PLAN Rodney Rose is a 44 y.o. male with history of schizophrenia, bipolar disorder (Dr. Almenu with [facility]), tobacco use, DM, and HTN who presented [facility]6 with c/o left sided weakness, facial droop, slurred speech and off balance gait since 11/27/20. He did not receive IV t-PA due to late presentation (>4.5 hours from time of onset)  Stroke: Acute infarction involving the right basal ganglia and adjacent white matter. More subacute infarct of the right caudate head. Likely  cryptogenic etiology  due to large size of his subcortical stroke  Resultant left hemiparesis  Code Stroke CT Head -   large right basal ganglia infarct of indeterminate age  CT head - Chronic appearing right basal ganglia infarct.   MRI head - Acute infarction involving the right basal ganglia and adjacent white matter. More subacute infarct of the right caudate head. No hemorrhage.  MRA H&N - Normal MRA of the head and neck. No large vessel occlusion, hemodynamically significant stenosis, or other acute vascular abnormality.   CTA H&N - not ordered  CT Perfusion - not ordered  Carotid Doppler - unremarkable  2D Echo - EF 60 - 65%. No cardiac source of emboli identified. MR severity may be underestimated.  Sars Corona Virus 2 - negative  LDL - 171  HgbA1c - 5.5  UDS - negative  VTE prophylaxis - Lovenox 40 mg daily Diet  Diet Order            Diet heart healthy/carb modified Room service appropriate? Yes; Fluid consistency: Thin  Diet effective now                   No antithrombotic prior to admission, now on aspirin 325 mg daily  Patient counseled to be compliant with his antithrombotic medications  Ongoing aggressive stroke risk factor management  Therapy recommendations:  pending  Disposition:  Pending  Hypertension  Home BP meds: amlodipine, Hyzaar  Current BP meds: amlodipine, Losartan, HCTZ  Stable . Permissive hypertension (OK if < 220/120) but gradually normalize in 5-7 days  . Long-term BP goal normotensive  Hyperlipidemia  Home Lipid lowering medication: none   LDL 171, goal < 70  Current lipid lowering medication: Lipitor 80 mg daily   Continue statin at discharge  Diabetes  Home diabetic meds: insulin ; metformin ; glipizide  Current diabetic meds: SSI   HgbA1c 5.5, goal < 7.0 Recent Labs    12/03/20 2140 12/04/20 0610 12/04/20 1207  GLUCAP 135* 112* 177*    Other Stroke Risk Factors  Cigarette smoker - advised to stop  smoking  Overweight, Body mass index is 27.77 kg/m., recommend weight loss, diet and exercise as appropriate   Other Active Problems  Code status - Full code  Anemia - Hgb - 8.9->9.6  Mild leukocytosis - WBC's - 11.0   (afebrile)   Hypokalemia - potassium - 3.1 - supplemented  CXR - Patchy airspace opacities in the left upper lobe and possibly right infrahilar region concerning for pneumonia.   BLE ABIs - moderate right lower extremity arterial disease  Mild to moderate mitral valve regurgitation by echo - (may be underestimated)  Acute vs chronic kidney disease - Creatinine - 1.98   Schizophrenia, bipolar disorder (Dr. Almenu with [facility])  Hospital day # 2  Patient presented with subacute left hemiparesis due to large right basal ganglia infarct likely of cryptogenic etiology.  MR angiogram shows no significant large vessel stenosis or occlusion.  Recommend further work-up by checking TEE, for cardiac source of embolism. Patient also appears to be at risk for sleep apnea but is declining participation in the sleep smart study. Recommend aspirin Plavix for 3 weeks followed by aspirin alone.  Aggressive risk factor management. Greater than 50% time during this 25-minute visit was spent on coordination of care and discussion with care team and answering questions.  Discussed with Dr. Carolyn Gilloud and her team and answered questions  , MD   To contact Stroke Continuity provider, please refer to [email or web address]. After hours, contact General Neurology 

## 2020-12-05 ENCOUNTER — Inpatient Hospital Stay (HOSPITAL_COMMUNITY): Payer: BLUE CROSS/BLUE SHIELD | Admitting: Anesthesiology

## 2020-12-05 ENCOUNTER — Encounter (HOSPITAL_COMMUNITY)
Admission: EM | Disposition: A | Discharge status: Home / Self Care | Payer: Self-pay | Source: Home / Self Care | Attending: Internal Medicine

## 2020-12-05 ENCOUNTER — Other Ambulatory Visit: Payer: Self-pay | Admitting: Student

## 2020-12-05 ENCOUNTER — Inpatient Hospital Stay (HOSPITAL_COMMUNITY): Payer: BLUE CROSS/BLUE SHIELD

## 2020-12-05 ENCOUNTER — Encounter (HOSPITAL_COMMUNITY): Payer: Self-pay | Admitting: Internal Medicine

## 2020-12-05 DIAGNOSIS — I639 Cerebral infarction, unspecified: Secondary | ICD-10-CM

## 2020-12-05 DIAGNOSIS — R000 Tachycardia, unspecified: Secondary | ICD-10-CM

## 2020-12-05 DIAGNOSIS — I340 Nonrheumatic mitral (valve) insufficiency: Secondary | ICD-10-CM

## 2020-12-05 HISTORY — PX: TEE WITHOUT CARDIOVERSION: SHX5443

## 2020-12-05 HISTORY — PX: BUBBLE STUDY: SHX6837

## 2020-12-05 LAB — CBC
HCT: 28.7 % — ABNORMAL LOW (ref 39.0–52.0)
Hemoglobin: 8.9 g/dL — ABNORMAL LOW (ref 13.0–17.0)
MCH: 25 pg — ABNORMAL LOW (ref 26.0–34.0)
MCHC: 31 g/dL (ref 30.0–36.0)
MCV: 80.6 fL (ref 80.0–100.0)
Platelets: 264 10*3/uL (ref 150–400)
RBC: 3.56 MIL/uL — ABNORMAL LOW (ref 4.22–5.81)
RDW: 15.2 % (ref 11.5–15.5)
WBC: 9.9 10*3/uL (ref 4.0–10.5)
nRBC: 0 % (ref 0.0–0.2)

## 2020-12-05 LAB — BASIC METABOLIC PANEL
Anion gap: 9 (ref 5–15)
BUN: 20 mg/dL (ref 6–20)
CO2: 25 mmol/L (ref 22–32)
Calcium: 8.5 mg/dL — ABNORMAL LOW (ref 8.9–10.3)
Chloride: 112 mmol/L — ABNORMAL HIGH (ref 98–111)
Creatinine, Ser: 2.03 mg/dL — ABNORMAL HIGH (ref 0.61–1.24)
GFR, Estimated: 41 mL/min — ABNORMAL LOW (ref >60)
Glucose, Bld: 110 mg/dL — ABNORMAL HIGH (ref 70–99)
Potassium: 3.5 mmol/L (ref 3.5–5.1)
Sodium: 146 mmol/L — ABNORMAL HIGH (ref 135–145)

## 2020-12-05 LAB — HEPATITIS C ANTIBODY: HCV Ab: NONREACTIVE

## 2020-12-05 LAB — GLUCOSE, CAPILLARY
Glucose-Capillary: 102 mg/dL — ABNORMAL HIGH (ref 70–99)
Glucose-Capillary: 166 mg/dL — ABNORMAL HIGH (ref 70–99)

## 2020-12-05 LAB — PATHOLOGIST SMEAR REVIEW

## 2020-12-05 LAB — HEPATITIS B CORE ANTIBODY, TOTAL: Hep B Core Total Ab: NONREACTIVE

## 2020-12-05 LAB — HEPATITIS B SURFACE ANTIGEN: Hepatitis B Surface Ag: NONREACTIVE

## 2020-12-05 SURGERY — ECHOCARDIOGRAM, TRANSESOPHAGEAL
Anesthesia: Monitor Anesthesia Care

## 2020-12-05 MED ORDER — AMLODIPINE BESYLATE 10 MG PO TABS
10.0000 mg | ORAL_TABLET | Freq: Every day | ORAL | 0 refills | Status: DC
Start: 2020-12-06 — End: 2020-12-11

## 2020-12-05 MED ORDER — LOSARTAN POTASSIUM-HCTZ 100-12.5 MG PO TABS: 1.0000 | ORAL_TABLET | Freq: Every day | ORAL | 0 refills | Status: DC

## 2020-12-05 MED ORDER — AMLODIPINE BESYLATE 10 MG PO TABS
10.0000 mg | ORAL_TABLET | Freq: Every day | ORAL | Status: DC
  Administered 2020-12-05: 10 mg via ORAL
  Filled 2020-12-05: qty 1

## 2020-12-05 MED ORDER — PROPOFOL 500 MG/50ML IV EMUL
INTRAVENOUS | Status: DC | PRN
  Administered 2020-12-05: 100 ug/kg/min via INTRAVENOUS

## 2020-12-05 MED ORDER — CARVEDILOL 12.5 MG PO TABS: 12.5000 mg | ORAL_TABLET | Freq: Two times a day (BID) | ORAL | 0 refills | Status: DC

## 2020-12-05 MED ORDER — ATORVASTATIN CALCIUM 80 MG PO TABS
80.0000 mg | ORAL_TABLET | Freq: Every day | ORAL | 0 refills | Status: DC
Start: 2020-12-06 — End: 2020-12-11

## 2020-12-05 MED ORDER — CARVEDILOL 12.5 MG PO TABS
12.5000 mg | ORAL_TABLET | Freq: Two times a day (BID) | ORAL | Status: DC
  Administered 2020-12-05: 12.5 mg via ORAL
  Filled 2020-12-05: qty 1

## 2020-12-05 MED ORDER — SODIUM CHLORIDE 0.9 % IV SOLN: INTRAVENOUS | Status: DC | PRN

## 2020-12-05 MED ORDER — CLOPIDOGREL BISULFATE 75 MG PO TABS
75.0000 mg | ORAL_TABLET | Freq: Every day | ORAL | 0 refills | Status: DC
Start: 2020-12-06 — End: 2021-01-13

## 2020-12-05 MED ORDER — ASPIRIN 81 MG PO TBEC
81.0000 mg | DELAYED_RELEASE_TABLET | Freq: Every day | ORAL | 11 refills | Status: DC
Start: 2020-12-06 — End: 2020-12-29

## 2020-12-05 MED ORDER — PROPOFOL 10 MG/ML IV BOLUS
INTRAVENOUS | Status: DC | PRN
  Administered 2020-12-05: 30 mg via INTRAVENOUS

## 2020-12-05 MED FILL — CARVEDILOL 12.5 MG TABLET: 12.5 | 30 days supply | Qty: 60 | Fill #0

## 2020-12-05 MED FILL — LOSARTAN-HCTZ 100-12.5 MG T: 100-12.5 | 30 days supply | Qty: 30 | Fill #0

## 2020-12-05 MED FILL — ASPIRIN LOW DOSE 81 MG TBEC: 81 | 30 days supply | Qty: 30 | Fill #0

## 2020-12-05 MED FILL — ATORVASTATIN CALCIUM 80 MG: 80 | 30 days supply | Qty: 30 | Fill #0

## 2020-12-05 MED FILL — AMLODIPINE BESYLATE 10 MG T: 10 | 30 days supply | Qty: 30 | Fill #0

## 2020-12-05 MED FILL — CLOPIDOGREL 75 MG TABLET: 75 | 18 days supply | Qty: 18 | Fill #0

## 2020-12-05 NOTE — Anesthesia Procedure Notes (Signed)
Procedure Name: MAC Date/Time: 12/05/2020 8:19 AM Performed by: Imagene Riches, CRNA Pre-anesthesia Checklist: Patient identified, Emergency Drugs available, Suction available, Patient being monitored and Timeout performed Patient Re-evaluated:Patient Re-evaluated prior to induction Oxygen Delivery Method: Nasal cannula

## 2020-12-05 NOTE — Progress Notes (Signed)
SLP Cancellation Note  Patient Details Name: Rodney Rose MRN: [id number] DOB: [birth year]   Cancelled treatment:       Reason Eval/Treat Not Completed:  (Pt has been discharged from the hospital.)  [name] I. Vear Clock, MS, CCC-SLP Acute Rehabilitation Services Office number [phone] Pager [phone]  [name] 12/05/2020, 4:46 PM

## 2020-12-05 NOTE — Progress Notes (Addendum)
[name] Progress Note    Assessment/ Plan:   1. 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 This is likely CKD/progressi Has been taking ibuprofen once daily for the past week but otherwise no changes to his medications.   Will contrinue to monitor for now. Creatinine stable. 1. Daily BMP 2. Avoid nephrotoxic agents, dose meds to kidney function 2. Nephrotic syndrome: Patient presenting with lower extremity edema, hypoalbuminemia, hyperlipidemia, and worsening kidney function. 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 Given the presence of hgb on urine studies labs done today and noted on prior urine studies there is concern for additional process, typically hgb would not be seen with diabetic nephropathy.  1. Urine microscopy evaluated -> only had 0-2 RBC, nondysmorphic. Will need repeat in a few mths, possible cystocopy. 2. No need for kidney biopsy at this time; UPC 3.8 likely from diabetic nephropathy. Secondary workup: Hep B, Hep C, HIV, SPEP w/ IF, FLC to start wtih 3. Will need outpatient nephrology follow up in the next 1-3 mths ([facility]) 4. Maximize ARB and already on Lipitor. Consider Farxiga 5. Ensure BP and DM control and statin therapy 3. Anemia: Hgb 8.9 on admission. Iron studies unremarkable. Could be 2/2 anemia of chronic disease. Transfuse as needed.  4. Acute CVA: Work up per primary, felt to be due to uncontrolled HTN.   T 5. HTN:  Uncontrolled.  Amlodipine 10 mg daily and losartan 100 mg daily.  Will start Coreg 12.5 mg twice daily to start with, can titrate up to twice daily. Side effect profile reviewed with patient 6. DM: On SSI.  7. Tobacco use: per primary 8. Schizophrenia: per primary  ADDENDUM: being discharged today, will need follow up at [facility] --discussed with our scheduler for a 6 week follow  up, please rx Coreg with dc meds   Subjective:   No complaints, no acute events.   Objective:   BP (!) 190/83   Pulse 100   Temp 97.7 F (36.5 C) (Axillary)   Resp 20   Ht 6\' 2"  (1.88 m)   Wt 95.9 kg   SpO2 100%   BMI 27.14 kg/m   Intake/Output Summary (Last 24 hours) at 12/05/2020 1441 Last data filed at 12/05/2020 0820 Gross per 24 hour  Intake 300 ml  Output --  Net 300 ml   Weight change: -2.2 kg  Physical Exam: General: [name], no acute distress, lying in bed Cardiac: Regular rate and rhythm, no murmurs rubs or gallops Pulmonary: CTA BL, no wheezing, rhonchi, rales Abdomen: Soft, nontender, nondistended Extremity: No lower extremity edema, warm, well-perfused  Imaging: VAS 14/09/2020 TRANSCRANIAL DOPPLER W BUBBLES  Result Date: 12/04/2020  Transcranial Doppler with Bubble Indications: Stroke. Comparison Study: No prior study Performing Technologist: 14/08/2020, RDMS  Examination Guidelines: A complete evaluation includes B-mode imaging, spectral Doppler, color Doppler, and power Doppler as needed of all accessible portions of each vessel. Bilateral testing is considered an integral part of a complete examination. Limited examinations for reoccurring indications may be performed as noted.  Summary: No HITS at rest or during Valsalva. Negative transcranial Doppler Bubble study with no evidence of right to left intracardiac communication.  A vascular evaluation was performed. The left middle cerebral artery was studied. An IV was inserted into the patient's left forearm. Verbal informed consent was obtained.  Negative TCD  Bubble study *See table(s) above for TCD measurements and observations.  Diagnosing physician: Delia Heady MD Electronically signed by Delia Heady MD on 12/04/2020 at 12:19:31 PM.    Final    ECHO TEE  Result Date: 12/05/2020    TRANSESOPHOGEAL ECHO REPORT   Patient Name:   Rodney Rose Date of Exam: 12/05/2020 Medical Rec #:  [id number]         Height:       74.0 in Accession #:    [id number]       Weight:       211.4 lb Date of Birth:  Jan 19, 1976        BSA:          2.225 m Patient Age:    44 years         BP:           189/95 mmHg Patient Gender: M                HR:           93 bpm. Exam Location:  Inpatient Procedure: Transesophageal Echo Indications:    stroke  History:        Patient has prior history of Echocardiogram examinations.                 Stroke.  Sonographer:    Irving Burton Senior Referring Phys: [phone] LINDSAY B ROBERTS PROCEDURE: The transesophogeal probe was passed without difficulty through the esophogus of the patient. Sedation performed by different physician. The patient's vital signs; including heart rate, blood pressure, and oxygen saturation; remained stable throughout the procedure. The patient developed no complications during the procedure. IMPRESSIONS  1. Left ventricular ejection fraction, by estimation, is 60 to 65%. The left ventricle has normal function. The left ventricle has no regional wall motion abnormalities.  2. Right ventricular systolic function is normal. The right ventricular size is normal.  3. Left atrial size was mildly dilated. No left atrial/left atrial appendage thrombus was detected.  4. The mitral valve is normal in structure. Mild mitral valve regurgitation. No evidence of mitral stenosis.  5. The aortic valve is normal in structure. Aortic valve regurgitation is not visualized. No aortic stenosis is present.  6. The inferior vena cava is normal in size with greater than 50% respiratory variability, suggesting right atrial pressure of 3 mmHg.  7. Agitated saline contrast bubble study was negative, with no evidence of any interatrial shunt. Conclusion(s)/Recommendation(s): Normal biventricular function without evidence of hemodynamically significant valvular heart disease. FINDINGS  Left Ventricle: Left ventricular ejection fraction, by estimation, is 60 to 65%. The left ventricle has normal function. The  left ventricle has no regional wall motion abnormalities. The left ventricular internal cavity size was normal in size. There is  no left ventricular hypertrophy. Right Ventricle: The right ventricular size is normal. No increase in right ventricular wall thickness. Right ventricular systolic function is normal. Left Atrium: Left atrial size was mildly dilated. No left atrial/left atrial appendage thrombus was detected. Right Atrium: Right atrial size was normal in size. Pericardium: There is no evidence of pericardial effusion. Mitral Valve: The mitral valve is normal in structure. Mild mitral valve regurgitation. No evidence of mitral valve stenosis. Tricuspid Valve: The tricuspid valve is normal in structure. Tricuspid valve regurgitation is not demonstrated. No evidence of tricuspid stenosis. Aortic Valve: The aortic valve is normal in structure. Aortic valve regurgitation is not visualized. No aortic stenosis is present. Pulmonic Valve: The pulmonic valve was  normal in structure. Pulmonic valve regurgitation is not visualized. No evidence of pulmonic stenosis. Aorta: The aortic root is normal in size and structure. Venous: The inferior vena cava is normal in size with greater than 50% respiratory variability, suggesting right atrial pressure of 3 mmHg. IAS/Shunts: No atrial level shunt detected by color flow Doppler. Agitated saline contrast was given intravenously to evaluate for intracardiac shunting. Agitated saline contrast bubble study was negative, with no evidence of any interatrial shunt. Charlton Haws MD Electronically signed by Charlton Haws MD Signature Date/Time: 12/05/2020/8:50:18 AM    Final    VAS [place] LOWER EXTREMITY VENOUS (DVT)  Result Date: 12/03/2020  Lower Venous DVT Study Indications: Stroke.  Comparison Study: 11-15-2018 Lower RT venous study available. Performing Technologist: Jean Rosenthal RDMS  Examination Guidelines: A complete evaluation includes B-mode imaging, spectral Doppler,  color Doppler, and power Doppler as needed of all accessible portions of each vessel. Bilateral testing is considered an integral part of a complete examination. Limited examinations for reoccurring indications may be performed as noted. The reflux portion of the exam is performed with the patient in reverse Trendelenburg.  +---------+---------------+---------+-----------+----------+--------------+ RIGHT    CompressibilityPhasicitySpontaneityPropertiesThrombus Aging +---------+---------------+---------+-----------+----------+--------------+ CFV      Full           Yes      Yes                                 +---------+---------------+---------+-----------+----------+--------------+ SFJ      Full                                                        +---------+---------------+---------+-----------+----------+--------------+ FV Prox  Full                                                        +---------+---------------+---------+-----------+----------+--------------+ FV Mid   Full                                                        +---------+---------------+---------+-----------+----------+--------------+ FV DistalFull                                                        +---------+---------------+---------+-----------+----------+--------------+ PFV      Full                                                        +---------+---------------+---------+-----------+----------+--------------+ POP      Full           Yes      Yes                                 +---------+---------------+---------+-----------+----------+--------------+  PTV      Full                                                        +---------+---------------+---------+-----------+----------+--------------+ PERO     Full                                                        +---------+---------------+---------+-----------+----------+--------------+    +---------+---------------+---------+-----------+----------+--------------+ LEFT     CompressibilityPhasicitySpontaneityPropertiesThrombus Aging +---------+---------------+---------+-----------+----------+--------------+ CFV      Full           Yes      Yes                                 +---------+---------------+---------+-----------+----------+--------------+ SFJ      Full                                                        +---------+---------------+---------+-----------+----------+--------------+ FV Prox  Full                                                        +---------+---------------+---------+-----------+----------+--------------+ FV Mid   Full                                                        +---------+---------------+---------+-----------+----------+--------------+ FV DistalFull                                                        +---------+---------------+---------+-----------+----------+--------------+ PFV      Full                                                        +---------+---------------+---------+-----------+----------+--------------+ POP      Full           Yes      Yes                                 +---------+---------------+---------+-----------+----------+--------------+ PTV      Full                                                        +---------+---------------+---------+-----------+----------+--------------+  PERO     Full                                                        +---------+---------------+---------+-----------+----------+--------------+     Summary: RIGHT: - There is no evidence of deep vein thrombosis in the lower extremity.  - No cystic structure found in the popliteal fossa.  LEFT: - There is no evidence of deep vein thrombosis in the lower extremity.  - No cystic structure found in the popliteal fossa.  *See table(s) above for measurements and observations. Electronically signed  by Lemar LivingsBrandon Cain MD on 12/03/2020 at 4:51:46 PM.    Final     Labs: BMET Recent Labs  Lab 12/01/20 1624 12/03/20 0655 12/04/20 0633 12/05/20 0352  NA 143 144 145 146*  K 3.2* 3.1* 3.6 3.5  CL 110 110 112* 112*  CO2 24 22 22 25   GLUCOSE 122* 122* 120* 110*  BUN 18 17 23* 20  CREATININE 2.04* 1.98* 2.15* 2.03*  CALCIUM 8.4* 8.7* 8.9 8.5*   CBC Recent Labs  Lab 12/01/20 1624 12/03/20 0655 12/04/20 0633 12/05/20 0352  WBC 10.3 11.0* 10.1 9.9  NEUTROABS  --  8.3*  --   --   HGB 8.9* 9.6* 9.3* 8.9*  HCT 29.0* 30.8* 30.5* 28.7*  MCV 81.5 79.0* 80.1 80.6  PLT 269 308 294 264    Medications:    . amLODipine  10 mg Oral Daily  . aspirin EC  81 mg Oral Daily  . atorvastatin  80 mg Oral Daily  . clopidogrel  75 mg Oral Daily  . enoxaparin (LOVENOX) injection  40 mg Subcutaneous Q24H  . haloperidol  5 mg Oral QHS  . losartan  100 mg Oral Daily   And  . hydrochlorothiazide  12.5 mg Oral Daily  . insulin aspart  0-15 Units Subcutaneous TID WC  . insulin aspart  0-5 Units Subcutaneous QHS

## 2020-12-05 NOTE — Progress Notes (Signed)
STROKE TEAM PROGRESS NOTE     INTERVAL HISTORY Patient is sitting up in bed.   He just returned from TEE which was normal.  ANA is negative.  Antiphospholipid antibody test is pending.  Sickle cell screen is negative.  He wants to go home.  OBJECTIVE Vitals:   12/05/20 0835 12/05/20 0845 12/05/20 0917 12/05/20 1140  BP: (!) 209/92 (!) 193/95 (!) 187/93 (!) 182/88  Pulse: 93 89 89 100  Resp: 20 16 20 20   Temp:   98.6 F (37 C) 97.7 F (36.5 C)  TempSrc:   Oral Axillary  SpO2: 99% 100% 100% 100%  Weight:      Height:        CBC:  Recent Labs  Lab 12/03/20 0655 12/04/20 0633 12/05/20 0352  WBC 11.0* 10.1 9.9  NEUTROABS 8.3*  --   --   HGB 9.6* 9.3* 8.9*  HCT 30.8* 30.5* 28.7*  MCV 79.0* 80.1 80.6  PLT 308 294 264    Basic Metabolic Panel:  Recent Labs  Lab 12/04/20 0633 12/05/20 0352  NA 145 146*  K 3.6 3.5  CL 112* 112*  CO2 22 25  GLUCOSE 120* 110*  BUN 23* 20  CREATININE 2.15* 2.03*  CALCIUM 8.9 8.5*    Lipid Panel:     Component Value Date/Time   CHOL 219 (H) 12/03/2020 0111   CHOL 245 (H) 11/02/2018 0925   TRIG 99 12/03/2020 0111   HDL 28 (L) 12/03/2020 0111   HDL 28 (L) 11/02/2018 0925   CHOLHDL 7.8 12/03/2020 0111   VLDL 20 12/03/2020 0111   LDLCALC 171 (H) 12/03/2020 0111   LDLCALC 155 (H) 11/02/2018 0925   HgbA1c:  Lab Results  Component Value Date   HGBA1C 5.5 12/03/2020   Urine Drug Screen:     Component Value Date/Time   LABOPIA NONE DETECTED 12/02/2020 0901   COCAINSCRNUR NONE DETECTED 12/02/2020 0901   LABBENZ NONE DETECTED 12/02/2020 0901   AMPHETMU NONE DETECTED 12/02/2020 0901   THCU NONE DETECTED 12/02/2020 0901   LABBARB NONE DETECTED 12/02/2020 0901    Alcohol Level No results found for: ETH  IMAGING  CT HEAD WO CONTRAST 12/01/2020 IMPRESSION:  1. Chronic appearing right basal ganglia infarct.  2. Very mild proximal bilateral ethmoid sinus mucosal thickening versus polyp/mucous retention cyst.   MR ANGIO HEAD WO  CONTRAST MR ANGIO NECK WO CONTRAST 12/02/2020 IMPRESSION:  Normal MRA of the head and neck. No large vessel occlusion, hemodynamically significant stenosis, or other acute vascular abnormality.   MR BRAIN WO CONTRAST 12/02/2020 IMPRESSION:  Acute infarction involving the right basal ganglia and adjacent white matter. More subacute infarct of the right caudate head. No hemorrhage.  DG Chest Portable 1 View  12/02/2020 IMPRESSION:  Patchy airspace opacities in the left upper lobe and possibly right infrahilar region concerning for pneumonia.   VAS 14/06/2020 ABI WITH/WO TBI 12/02/2020 Summary:  Right: Resting right ankle-brachial index indicates moderate right lower extremity arterial disease.  Left: Resting left ankle-brachial index is within normal range. No evidence of significant left lower extremity arterial disease.  Preliminary    ECHOCARDIOGRAM COMPLETE 12/02/2020 IMPRESSIONS   1. Left ventricular ejection fraction, by estimation, is 50 to 55%. The left ventricle has low normal function. The left ventricle demonstrates global hypokinesis. There is mild concentric left ventricular hypertrophy. Left ventricular diastolic parameters are consistent with Grade II diastolic dysfunction (pseudonormalization). Elevated left atrial pressure.   2. Right ventricular systolic function is normal. The right ventricular size  is normal. Tricuspid regurgitation signal is inadequate for assessing PA pressure.   3. Left atrial size was mildly dilated.   4. The mitral valve is normal in structure. Mild to moderate mitral valve regurgitation.  5. The aortic valve is tricuspid. Aortic valve regurgitation is not visualized. No aortic stenosis is present.   Conclusion(s)/Recommendation(s):  No intracardiac source of embolism detected on this transthoracic study. A transesophageal echocardiogram is recommended to exclude cardiac source of embolism if clinically indicated. MR severity may be underestimated. Consider  TEE for that indication also.   VAS US CAROTID (at [facility] and [facility] only) 12/02/2020 Summary:  Right Carotid: Velocities in the right ICA are consistent with a 1-39% stenosis.  Left Carotid: Velocities in the left ICA are consistent with a 1-39% stenosis.  Vertebrals:  Bilateral vertebral arteries demonstrate antegrade flow.  Subclavians: Normal flow hemodynamics were seen in bilateral subclavian arteries. Preliminary    ECG - SR rate 98 BPM. (See cardiology reading for complete details)  PHYSICAL EXAM Blood pressure (!) 182/88, pulse 100, temperature 97.7 F (36.5 C), temperature source Axillary, resp. rate 20, height 6\' 2"  (1.88 m), weight 95.9 kg, SpO2 100 %. Pleasant middle-age African-American male not in distress. . Afebrile. Head is nontraumatic. Neck is supple without bruit.    Cardiac exam no murmur or gallop. Lungs are clear to auscultation. Distal pulses are well felt. Neurological Exam Awake alert oriented x 3 normal speech and language. Mild left lower face asymmetry. Tongue midline.  Mild left lower extremity drift. Mild diminished fine finger movements on left. Orbits right over left upper extremity. Mild left grip and hip flexor weakness.. Normal sensation . Normal coordination.  Gait deferred     ASSESSMENT/PLAN [name] with history of schizophrenia, bipolar disorder (Dr. 55 with [facility]), tobacco use, DM, and HTN who [facility] with c/o left sided weakness, facial droop, slurred speech and off balance gait since 11/27/20. He did not receive IV t-PA due to late presentation (>4.5 hours from time of onset)  Stroke: Acute infarction involving the right basal ganglia and adjacent white matter. More subacute infarct of the right caudate head. Likely cryptogenic etiology  due to large size of his subcortical stroke  Resultant left hemiparesis  Code Stroke CT Head -   large right basal ganglia infarct of indeterminate  age  CT head - Chronic appearing right basal ganglia infarct.   MRI head - Acute infarction involving the right basal ganglia and adjacent white matter. More subacute infarct of the right caudate head. No hemorrhage.  MRA H&N - Normal MRA of the head and neck. No large vessel occlusion, hemodynamically significant stenosis, or other acute vascular abnormality.   CTA H&N - not ordered  CT Perfusion - not ordered  Carotid Doppler - unremarkable  2D Echo - EF 60 - 65%. No cardiac source of emboli identified. MR severity may be underestimated.  Sars Corona Virus 2 - negative  LDL - 171  HgbA1c - 5.5  UDS - negative  VTE prophylaxis - Lovenox 40 mg daily Diet  Diet Order            Diet Heart Room service appropriate? Yes; Fluid consistency: Thin  Diet effective now           Diet - low sodium heart healthy                 No antithrombotic prior to admission, now  on aspirin 325 mg daily  Patient counseled to be compliant with his antithrombotic medications  Ongoing aggressive stroke risk factor management  Therapy recommendations:  pending  Disposition:  Pending  Hypertension  Home BP meds: amlodipine, Hyzaar  Current BP meds: amlodipine, Losartan, HCTZ  Stable . Permissive hypertension (OK if < 220/120) but gradually normalize in 5-7 days  . Long-term BP goal normotensive  Hyperlipidemia  Home Lipid lowering medication: none   LDL 171, goal < 70  Current lipid lowering medication: Lipitor 80 mg daily   Continue statin at discharge  Diabetes  Home diabetic meds: insulin ; metformin ; glipizide  Current diabetic meds: SSI   HgbA1c 5.5, goal < 7.0 Recent Labs    12/04/20 2315 12/05/20 0608 12/05/20 1207  GLUCAP 107* 102* 166*    Other Stroke Risk Factors  Cigarette smoker - advised to stop smoking  Overweight, Body mass index is 27.14 kg/m., recommend weight loss, diet and exercise as appropriate   Other Active Problems  Code status  - Full code  Anemia - Hgb - 8.9->9.6  Mild leukocytosis - WBC's - 11.0   (afebrile)   Hypokalemia - potassium - 3.1 - supplemented  CXR - Patchy airspace opacities in the left upper lobe and possibly right infrahilar region concerning for pneumonia.   BLE ABIs - moderate right lower extremity arterial disease  Mild to moderate mitral valve regurgitation by echo - (may be underestimated)  Acute vs chronic kidney disease - Creatinine - 1.98   Schizophrenia, bipolar disorder (Dr. Georgia Lopes with [facility])  Hospital day # 3  Patient presented with subacute left hemiparesis due to large right basal ganglia infarct likely of cryptogenic etiology.  Work-up for stroke etiology so far been negative.  Antiphospholipid antibody test is pending.  Recommend aspirin Plavix for 3 weeks followed by aspirin alone.  Aggressive risk factor management. Greater than 50% time during this 25-minute visit was spent on coordination of care and discussion with care team and answering questions.  Discussed with Dr. Hessie Dibble and her team and answered questions   Stroke team will sign off.  Follow-up as an outpatient stroke clinic in 6 weeks Delia Heady, MD   To contact Stroke Continuity provider, please refer to [email or web address]. After hours, contact General Neurology

## 2020-12-05 NOTE — CV Procedure (Signed)
TEE: Anesthesia: Propofol  Normal EF 55% Mild MR No LAA thrombus No PFO Negative bubble study Normal AV No effusion   Needs better BP control   Charlton Haws MD [facility]

## 2020-12-05 NOTE — Progress Notes (Signed)
Physical Therapy Treatment Patient Details Name: Rodney Rose MRN: [id number] DOB: 05/02/1976 Today's Date: 12/05/2020    History of Present Illness Pt is a 44 year old male who presented with L sided weakness, L facial droop, and slurred speech that began around 4 days prior to arrival. 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 PMH: schizophrenia, HTN, chronic back pain, and bipolar disorder.    PT Comments    Pt has made further progress towards his goals through demonstrating improved dynamic balance. He was able to ambulate and withstand perturbations to his trunk without LOB this date, indicating improved safety with mobility. He also demonstrated improved balance with stair negotiation as he did not display LOB while progressing from step-to to reciprocal gait pattern. He continues to demonstrate L side weakness and coordination deficits though through difficulty placing L hand on rail for stairs and through decreased stance time on L with gait. Current recommendations remain appropriate. Will continue to follow acutely.   Follow Up Recommendations  Outpatient PT;Supervision for mobility/OOB (neuro specialty clinic)     Equipment Recommendations  None recommended by PT    Recommendations for Other Services       Precautions / Restrictions Precautions Precautions: Fall Precaution Comments: HTN Restrictions Weight Bearing Restrictions: No    Mobility  Bed Mobility               General bed mobility comments: Pt up out of bed upon arrival.  Transfers Overall transfer level: Needs assistance Equipment used: None Transfers: Sit to/from Stand Sit to Stand: Independent         General transfer comment: Pt safe with sit to stand transfers without trunk sway this date.  Ambulation/Gait Ambulation/Gait assistance: Min guard   Assistive device: None Gait Pattern/deviations: Step-through pattern;Decreased stride  length;Narrow base of support;Decreased step length - right;Decreased stance time - left;Decreased weight shift to left Gait velocity: decreased Gait velocity interpretation: >2.62 ft/sec, indicative of community ambulatory General Gait Details: Continues to display decreased L stance time and thus decreased R step length, cued to correct with min change. Provided perturbations to trunk during gait with no overt LOB. Min guard for safety.   Stairs   Stairs assistance: Min guard Stair Management: Alternating pattern;Step to pattern;One rail Left;One rail Right Number of Stairs: 10 General stair comments: Ascending with L rail and descending with R rail to simulate home set-up. Cued pt to lead up with R and down with L with step-to pattern for safe mobility at home. Pt able to progress from step-to to reciprocal without LOB, min guard for safety.   Wheelchair Mobility    Modified Rankin (Stroke Patients Only) Modified Rankin (Stroke Patients Only) Pre-Morbid Rankin Score: No symptoms Modified Rankin: Moderately severe disability     Balance Overall balance assessment: Needs assistance Sitting-balance support: Feet supported Sitting balance-Leahy Scale: Good Sitting balance - Comments: Sitting EOB no LOB with supervision for safety.   Standing balance support: No upper extremity supported;During functional activity Standing balance-Leahy Scale: Good Standing balance comment: Ambulates with no UE support, no LOB even with perturbations to trunk during gait.                            Cognition Arousal/Alertness: Awake/alert Behavior During Therapy: WFL for tasks assessed/performed Overall Cognitive Status: Impaired/Different from baseline Area of Impairment: Safety/judgement  Safety/Judgement: Decreased awareness of deficits     General Comments: A&Ox4. Pt more cautious but continues to be slow to process cues.      Exercises       General Comments        Pertinent Vitals/Pain Pain Assessment: Faces Faces Pain Scale: No hurt Pain Intervention(s): Limited activity within patient's tolerance;Monitored during session;Repositioned    Home Living                      Prior Function            PT Goals (current goals can now be found in the care plan section) Acute Rehab PT Goals Patient Stated Goal: to go home PT Goal Formulation: With patient Time For Goal Achievement: 12/17/20 Potential to Achieve Goals: Good Progress towards PT goals: Progressing toward goals    Frequency    Min 3X/week      PT Plan Current plan remains appropriate    Co-evaluation              AM-PAC PT "6 Clicks" Mobility   Outcome Measure  Help needed turning from your back to your side while in a flat bed without using bedrails?: None Help needed moving from lying on your back to sitting on the side of a flat bed without using bedrails?: None Help needed moving to and from a bed to a chair (including a wheelchair)?: A Little Help needed standing up from a chair using your arms (e.g., wheelchair or bedside chair)?: None Help needed to walk in hospital room?: A Little Help needed climbing 3-5 steps with a railing? : A Little 6 Click Score: 21    End of Session Equipment Utilized During Treatment: Gait belt Activity Tolerance: Patient tolerated treatment well Patient left: with call bell/phone within reach;in bed;with bed alarm set Nurse Communication: Mobility status PT Visit Diagnosis: Unsteadiness on feet (R26.81);Other abnormalities of gait and mobility (R26.89);Muscle weakness (generalized) (M62.81);Other symptoms and signs involving the nervous system (R29.898)     Time: 6294-7654 PT Time Calculation (min) (ACUTE ONLY): 9 min  Charges:  $Gait Training: 8-22 mins                     Raymond Gurney, PT, DPT Acute Rehabilitation Services  Pager: [phone] Office: [phone]    Rodney Rose 12/05/2020, 3:35 PM

## 2020-12-05 NOTE — Interval H&P Note (Signed)
History and Physical Interval Note:  12/05/2020 7:50 AM  Rodney Rose  has presented today for surgery, with the diagnosis of STROKE.  The various methods of treatment have been discussed with the patient and family. After consideration of risks, benefits and other options for treatment, the patient has consented to  Procedure(s): TRANSESOPHAGEAL ECHOCARDIOGRAM (TEE) (N/A) as a surgical intervention.  The patient's history has been reviewed, patient examined, no change in status, stable for surgery.  I have reviewed the patient's chart and labs.  Questions were answered to the patient's satisfaction.     Charlton Haws

## 2020-12-05 NOTE — Transfer of Care (Signed)
Immediate Anesthesia Transfer of Care Note  Patient: Rodney Rose  Procedure(s) Performed: TRANSESOPHAGEAL ECHOCARDIOGRAM (TEE) (N/A ) BUBBLE STUDY  Patient Location: Endoscopy Unit  Anesthesia Type:MAC  Level of Consciousness: awake and alert   Airway & Oxygen Therapy: Patient Spontanous Breathing  Post-op Assessment: Report given to RN and Post -op Vital signs reviewed and stable  Post vital signs: Reviewed and stable  Last Vitals:  Vitals Value Taken Time  BP 193/95 12/05/20 0845  Temp 36.5 C 12/05/20 0825  Pulse 91 12/05/20 0845  Resp 23 12/05/20 0845  SpO2 99 % 12/05/20 0845  Vitals shown include unvalidated device data.  Last Pain:  Vitals:   12/05/20 0917  TempSrc: Oral  PainSc:          Complications: No complications documented.

## 2020-12-05 NOTE — Discharge Summary (Signed)
Name: Rodney Rose MRN: [id number] DOB: [birth year] 45 y.o. PCP: Patient, No Pcp Per  Date of Admission: 12/01/2020  4:01 PM Date of Discharge: 12/05/2020 Attending Physician: Dr. Antony Contras  Discharge Diagnosis: 1. Cryptogenic CVA 2. 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 Acute on Chronic Anemia 4. Hypertension 5. T2DM   Discharge Medications: Allergies as of 12/05/2020      Reactions   Penicillins Swelling      Medication List    STOP taking these medications   hydrochlorothiazide 12.5 MG tablet Commonly known as: HYDRODIURIL   HYDROcodone-homatropine 5-1.5 MG/5ML syrup Commonly known as: HYCODAN   insulin glargine (2 Unit Dial) 300 UNIT/ML Solostar Pen Commonly known as: Toujeo Max SoloStar   Insulin Pen Needle 32G X 4 MM Misc Commonly known as: BD Pen Needle Nano U/F   loperamide 2 MG capsule Commonly known as: IMODIUM   losartan 50 MG tablet Commonly known as: COZAAR   losartan-hydrochlorothiazide 50-12.5 MG tablet Commonly known as: Hyzaar Replaced by: losartan-hydrochlorothiazide 100-12.5 MG tablet   ondansetron 8 MG disintegrating tablet Commonly known as: ZOFRAN-ODT     TAKE these medications   amLODipine 10 MG tablet Commonly known as: NORVASC Take 1 tablet (10 mg total) by mouth daily. What changed:   medication strength  how much to take   aspirin 81 MG EC tablet Take 1 tablet (81 mg total) by mouth daily. Swallow whole.   atorvastatin 80 MG tablet Commonly known as: LIPITOR Take 1 tablet (80 mg total) by mouth daily.   carvedilol 12.5 MG tablet Commonly known as: COREG Take 1 tablet (12.5 mg total) by mouth 2 (two) times daily with a meal.   clopidogrel 75 MG tablet Commonly known as: PLAVIX Take 1 tablet (75 mg total) by mouth daily.   glipiZIDE-metformin 5-500 MG tablet Commonly known as: METAGLIP Take 2 tablets by mouth 2 (two) times daily before a meal.   haloperidol 5 MG tablet Commonly known as:  HALDOL Take 1 tablet (5 mg total) by mouth at bedtime.   losartan-hydrochlorothiazide 100-12.5 MG tablet Commonly known as: HYZAAR Take 1 tablet by mouth daily. Replaces: losartan-hydrochlorothiazide 50-12.5 MG tablet       Disposition and follow-up:   Mr.Wilburt J Veley was discharged from [facility] in Stable condition.  At the hospital follow up visit please address:  1.  A). Cryptogenic CVA : DAPT x 3 weeks then aspirin alone. Atorvastatin 80 mg.  Aggressive risk factor modification. PT / OT recommending outpatient follow up. B). Nephrotic Syndrome, progression to CKD IIIb : Losartan was maximized to 100 mg daily. Please follow up Nephrology as an outpatient in 1-3 months, along with strict control of DM and HTN C). Acute on Chronic Anemia : Please monitor closely, no follow up at this time D). Hypertension : Please follow up in our [facility] clinic. Discharged on Amlodipine 5, Lostartan-HCTZ 100-12.5 mg, and coreg 12.5 m E). T2DM : Please continue home regimen and follow up with PCP for any further changes.  2.  Labs / imaging needed at time of follow-up: BP, Blood glucose, Hb  3.  Pending labs/ test needing follow-up: Hep B, Hep C, HIV, SPEP w/ IF, FLC  Follow-up Appointments:  Follow-up Information    [facility].   Specialty: Rehabilitation Why: The outpatient therapy will contact you for the first appointment Contact information: [address] [id number] mc [place] [address] [phone]       Steffanie Dunn  T, MD Follow up on 02/03/2021.   Specialties: Cardiology, Radiology Why: at 12 noon for post hospital; discuss monitor and loop Contact information: [address] [place] [address] [phone]        [facility]. Call in 1 week(s).   Specialty: Neurology Why: to schedule an appointment in 4 weeks Contact information: [address] [place] [address] [phone]              Hospital Course by problem list: 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 Transcranial bubble study was normal. No LVO on MRA head and neck. Normal sinus rhythm on telemetry. Plan for DAPT x 3 weeks then aspirin alone. Atorvastatin 80 mg.  Aggressive risk factor modification. PT / OT recommending outpatient follow up.    2. 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 Nephrology consulted this admission. No plans for kidney biopsy currently but secondary work up pending (Hep B, Hep C, HIV, SPEP w/ IF, FLC). Volume status improved after IV lasix.  His losartan was maximized to 100 mg daily.    3. Acute on Chronic Anemia: Iron studies  WNL. Blood smear with normocytic anemia. Possible anemia of chronic disease with worsening renal failure. Monitor.    4. HTN: "non compliant" but patient says this is because he cannot afford his Dr.'s copay. He was rationing his prescriptions because he wad denied further refills. He would like to follow up in our [facility] clinic. He was discharged on Amlodipine 5, Lostartan-HCTZ 100-12.5 mg, and coreg 12.5 mg. BP still elevated on discharge, 151/73. Needs close follow up for further titration of his meds.   5. Type II DM -on glipizide-metformin as an outpatient  Hgb A1c 5.5       Discharge Vitals:   BP (!) 151/73   Pulse 100   Temp 97.7 F (36.5 C) (Axillary)   Resp 20   Ht 6\' 2"  (1.88 m)   Wt 95.9 kg   SpO2 100%   BMI 27.14 kg/m   Pertinent Labs, Studies, and Procedures:  CBC Latest Ref Rng & Units 12/05/2020 12/04/2020 12/03/2020  WBC 4.0 - 10.5 K/uL 9.9 10.1 11.0(H)  Hemoglobin 13.0 - 17.0 g/dL 14/07/2020) 2.4(O) 9.7(D)  Hematocrit 39.0 - 52.0 % 28.7(L) 30.5(L) 30.8(L)  Platelets 150 - 400 K/uL 264 294 308   BMP Latest Ref Rng & Units 12/05/2020 12/04/2020 12/03/2020  Glucose 70 - 99 mg/dL 14/07/2020) 992(E) 268(T)  BUN 6 - 20 mg/dL 20 419(Q) 17   Creatinine 0.61 - 1.24 mg/dL 22(W) 9.79(G) 9.21(J)  BUN/Creat Ratio 9 - 20 - - -  Sodium 135 - 145 mmol/L 146(H) 145 144  Potassium 3.5 - 5.1 mmol/L 3.5 3.6 3.1(L)  Chloride 98 - 111 mmol/L 112(H) 112(H) 110  CO2 22 - 32 mmol/L 25 22 22   Calcium 8.9 - 10.3 mg/dL 9.41(D) 8.9 )   CT Head:  IMPRESSION: 1. Chronic appearing right basal ganglia infarct. 2. Very mild proximal bilateral ethmoid sinus mucosal thickening versus polyp/mucous retention cyst.  Chest x-ray  IMPRESSION: Patchy airspace opacities in the left upper lobe and possibly right infrahilar region concerning for pneumonia.  MR Brain   IMPRESSION: Acute infarction involving the right basal ganglia and adjacent white matter. More subacute infarct of the right caudate head. No Hemorrhage.  MR Angio Head Neck  IMPRESSION: Normal MRA of the head and neck. No large vessel occlusion, hemodynamically significant stenosis, or other acute vascular abnormality. Discharge Instructions:  Discharge Instructions    Ambulatory referral to Occupational Therapy   Complete by: As directed    Ambulatory referral to Physical Therapy   Complete by: As directed    Ambulatory referral to Speech Therapy   Complete by: As directed    Call MD for:  difficulty breathing, headache or visual disturbances   Complete by: As directed    Call MD for:  extreme fatigue   Complete by: As directed    Call MD for:  hives   Complete by: As directed    Call MD for:  persistant dizziness or light-headedness   Complete by: As directed    Call MD for:  persistant nausea and vomiting   Complete by: As directed    Call MD for:  severe uncontrolled pain   Complete by: As directed    Call MD for:  temperature >100.4   Complete by: As directed    Diet - low sodium heart healthy   Complete by: As directed    Discharge instructions   Complete by: As directed    Mr. Power:   You were admitted to the hospital for a stroke. Work up  included imaging of your heart that did not show any major problems. We also looked at the big vessels in your head and neck, which looked clear. Due to this, we cannot pinpoint the cause of your stroke. You will need to continue following with the stroke doctors.  HOWEVER, there are things you can do to reduce your risk of another stroke. This includes:  - Controlling your blood pressure - Quit smoking - Cut out red meat, including beef.  - Take your medications daily, as prescribed - Follow up with your doctors   Medication changes were made. We have sent these to the pharmacy in the hospital, so they can bring your medications to your bed.   START Amlodipine 10 mg daily START Losartan-HCTZ 100-12.5 mg daily START Atorvastatin 80 mg daily  START Aspirin 81 mg daily START Plavix (Clopidogrel) 75 mg daily for 3 weeks only  For your diabetes:  CONTINUE Glipizide-Metformin  While here, we also found that you have kidney disease called Nephrotic Syndrome, that is due to your diabetes and high blood pressure. Please follow up with the kidney doctors in the next few weeks.   We will have our clinic contact you to schedule a follow up visit in 1 week!   We wish you the best!   Increase activity slowly   Complete by: As directed       Signed: Arnoldo Lenis, MD 12/07/2020, 3:53 PM   Pager: [phone]

## 2020-12-05 NOTE — Progress Notes (Signed)
Echocardiogram Echocardiogram Transesophageal has been performed.  Warren Lacy Anuradha Chabot 12/05/2020, 8:30 AM

## 2020-12-05 NOTE — Progress Notes (Addendum)
Pt refused loop consideration. We discussed prevalence of AF in cryptogenic stroke and risk of stroke if AF goes undetected. He verbalized understanding. Would prefer to avoid procedures at this time.   Will set up for 30 day monitor and follow up in clinic to further discuss loop.   Insurance: [employer] EKG with NSR at 98 bpm. PR interval 172 ms, QRS 100 ms. Previous EKG shows Sinus tach Tele with NSR/Sinus tach -> mostly 80-90s  Casimiro Needle [address] [place], [place]  12/05/2020 8:15 AM

## 2020-12-05 NOTE — Progress Notes (Signed)
Occupational Therapy Treatment Patient Details Name: Rodney Rose MRN: [id number] DOB: 11/15/1976 Today's Date: 12/05/2020    History of present illness 44 y.o. male presenting with L-sided facial droop, slurred speech, and LUE weakness. MRI (+) acute R basal ganglia infarct. Patient also with LE edema and volume overload given lasix. S/p bubble study (-) for PFO. TEE showing normal EF 55% and mild MR. PMHx significant for uncontrolled HTN, tobacco use disorder, DM II, Bipolar disorder and Schizophrenia.   OT comments  OT treatment session with focus on self-care re-education, LUE NMR, ADL transfers, and functional mobility without use of AD. Patient completed toileting/hygiene/clothing management with supervision A for safety and tub/shower transfer with use of 3-in-1 with supervision after education on technique. Patient continues to be limited by LUE hemiparesis, L inattention, decreased balance, and decreased awareness of deficits. OT will continue to follow acutely.    Follow Up Recommendations  Outpatient OT;Supervision - Intermittent    Equipment Recommendations  3 in 1 bedside commode    Recommendations for Other Services      Precautions / Restrictions Precautions Precautions: Fall Precaution Comments: HTN, monitor BP Restrictions Weight Bearing Restrictions: No       Mobility Bed Mobility Overal bed mobility: Independent                Transfers Overall transfer level: Needs assistance Equipment used: None Transfers: Sit to/from Stand Sit to Stand: Modified independent (Device/Increase time)              Balance   Sitting-balance support: Feet supported Sitting balance-Leahy Scale: Good     Standing balance support: No upper extremity supported;During functional activity Standing balance-Leahy Scale: Fair                             ADL either performed or assessed with clinical judgement   ADL Overall ADL's : Needs  assistance/impaired                         Toilet Transfer: Radiographer, therapeutic Details (indicate cue type and reason): For safety Toileting- Clothing Manipulation and Hygiene: Supervision/safety Toileting - Clothing Manipulation Details (indicate cue type and reason): For safety Tub/ Shower Transfer: Supervision/safety;Tub transfer;3 in 1 Tub/Shower Transfer Details (indicate cue type and reason): For safety Functional mobility during ADLs: Supervision/safety General ADL Comments: Mobility in hallway without AD and supervision A for safety with cues for attention to L.     Vision       Perception     Praxis      Cognition Arousal/Alertness: Awake/alert Behavior During Therapy: WFL for tasks assessed/performed Overall Cognitive Status: Impaired/Different from baseline Area of Impairment: Safety/judgement                         Safety/Judgement: Decreased awareness of deficits              Exercises Exercises: Other exercises Other Exercises Other Exercises: Education on LUE NMR HEP with written handouts. Medium resistance theraputty given.   Shoulder Instructions       General Comments BP 184/83 at rest and 186/82 after tub/shower transfer.    Pertinent Vitals/ Pain       Pain Assessment: No/denies pain  Home Living  Prior Functioning/Environment              Frequency  Min 2X/week        Progress Toward Goals  OT Goals(current goals can now be found in the care plan section)  Progress towards OT goals: Progressing toward goals  Acute Rehab OT Goals Patient Stated Goal: To return to work. OT Goal Formulation: With patient Time For Goal Achievement: 12/17/20 Potential to Achieve Goals: Good ADL Goals Pt Will Perform Upper Body Dressing: Independently Pt Will Perform Lower Body Dressing: Independently;sit to/from stand Pt Will Transfer to Toilet:  Independently;ambulating Pt Will Perform Toileting - Clothing Manipulation and hygiene: Independently;sit to/from stand;sitting/lateral leans Pt Will Perform Tub/Shower Transfer: with supervision Additional ADL Goal #1: Patient will demonstrate LUE HEP with I and use of written handout.  Plan Discharge plan remains appropriate    Co-evaluation                 AM-PAC OT "6 Clicks" Daily Activity     Outcome Measure   Help from another person eating meals?: A Little Help from another person taking care of personal grooming?: A Little Help from another person toileting, which includes using toliet, bedpan, or urinal?: A Little Help from another person bathing (including washing, rinsing, drying)?: A Little Help from another person to put on and taking off regular upper body clothing?: None Help from another person to put on and taking off regular lower body clothing?: A Little 6 Click Score: 19    End of Session Equipment Utilized During Treatment: Gait belt  OT Visit Diagnosis: Unsteadiness on feet (R26.81);Hemiplegia and hemiparesis Hemiplegia - Right/Left: Left Hemiplegia - dominant/non-dominant: Non-Dominant Hemiplegia - caused by: Cerebral infarction   Activity Tolerance Patient tolerated treatment well   Patient Left in bed;with bed alarm set;with call bell/phone within reach   Nurse Communication          Time: 6144-3154 OT Time Calculation (min): 25 min  Charges: OT General Charges $OT Visit: 1 Visit OT Treatments $Therapeutic Activity: 8-22 mins $Neuromuscular Re-education: 8-22 mins  Jahlon Baines H. OTR/L Supplemental OT, Department of rehab services [phone]  Christine Schiefelbein R H. 12/05/2020, 11:10 AM

## 2020-12-05 NOTE — Progress Notes (Signed)
   12/05/20 0917  Vitals  BP (!) 187/93  MAP (mmHg) 119  BP Location Left Arm  BP Method Automatic   Elevated BP. Morning scheduled BP medication given and PRN hydralazine.

## 2020-12-05 NOTE — TOC Transition Note (Signed)
Transition of Care [facility]) - CM/SW Discharge Note   Patient Details  Name: Rodney Rose MRN: [id number] Date of Birth: [birth year]  Transition of Care [facility]) CM/SW Contact:  Kermit Balo, RN Phone Number: 12/05/2020, 1:47 PM   Clinical Narrative:    Pt is discharging home today. Pt set up with [facility] for outpatient therapy.  3 in 1 ordered. Pt asking for the orders for the DME and not to have it delivered to the room. CM provided him the orders.  [facility] to call the patient for first PCP appt. Pt has supervision at home and transportation to home.   Final next level of care: OP Rehab Barriers to Discharge: No Barriers Identified   Patient Goals and CMS Choice     Choice offered to / list presented to : Patient  Discharge Placement                       Discharge Plan and Services   Discharge Planning Services: CM Consult            DME Arranged: 3-N-1                    Social Determinants of Health (SDOH) Interventions     Readmission Risk Interventions No flowsheet data found.

## 2020-12-05 NOTE — Progress Notes (Signed)
Internal Medicine Attending Note:  Patient doing well this morning, eager for discharge.   Blood pressure (!) 151/73, pulse 100, temperature 97.7 F (36.5 C), temperature source Axillary, resp. rate 20, height 6\' 2"  (1.88 m), weight 95.9 kg, SpO2 100 %.  Physical Exam Constitutional: NAD, appears comfortable Cardiovascular: RRR, no murmurs, rubs, or gallops.  Pulmonary/Chest: CTAB, normal effort Abdominal: Soft, non tender, non distended. +BS.  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   1. Cryptogenic CVA: TEE today negative for PFO. Yesterday transcranial bubble study was normal. No LVO on MRA head and neck. Normal sinus rhythm on telemetry. Appreciate neurology's assistance. Plan for DAPT x 3 weeks then aspirin alone. Atorvastatin 80 mg.  Aggressive risk factor modification. PT / OT recommending outpatient follow up.   2. 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 Nephrology consulted this admission. No plans for kidney biopsy currently but secondary work up pending (Hep B, Hep C, HIV, SPEP w/ IF, FLC). Volume status improved after IV lasix.  His losartan was maximized to 100 mg daily.   3. Acute on Chronic Anemia: Iron studies  WNL. Blood smear with normocytic anemia. Possible anemia of chronic disease with worsening renal failure. Monitor.   4. HTN: "non compliant" but patient says this is because he cannot afford his Dr.'s copay. He was rationing his prescriptions because he wad denied further refills. He would like to follow up in our [facility] clinic. He was discharged on Amlodipine 5, Lostartan-HCTZ 100-12.5 mg, and coreg 12.5 mg. BP still elevated on discharge, 151/73. Needs close follow up for further titration of his meds.    [facility], MD 12/05/2020, 4:05 PM

## 2020-12-06 LAB — HEPATITIS B SURFACE ANTIBODY, QUANTITATIVE: Hep B S AB Quant (Post): 5 m[IU]/mL — ABNORMAL LOW (ref >9.9)

## 2020-12-06 NOTE — Anesthesia Postprocedure Evaluation (Signed)
Anesthesia Post Note  Patient: Kaylub Detienne Shiley  Procedure(s) Performed: TRANSESOPHAGEAL ECHOCARDIOGRAM (TEE) (N/A ) BUBBLE STUDY     Patient location during evaluation: Endoscopy Anesthesia Type: MAC Level of consciousness: awake Pain management: pain level controlled Vital Signs Assessment: post-procedure vital signs reviewed and stable Respiratory status: spontaneous breathing, nonlabored ventilation, respiratory function stable and patient connected to nasal cannula oxygen Cardiovascular status: stable and blood pressure returned to baseline Postop Assessment: no apparent nausea or vomiting Anesthetic complications: no   No complications documented.  Last Vitals:  Vitals:   12/05/20 1430 12/05/20 1500  BP: (!) 190/83 (!) 151/73  Pulse:    Resp:    Temp:    SpO2:      Last Pain:  Vitals:   12/05/20 1215  TempSrc:   PainSc: 0-No pain                 Gissele Narducci P Breckon Reeves

## 2020-12-07 ENCOUNTER — Encounter (HOSPITAL_COMMUNITY): Payer: Self-pay | Admitting: Cardiovascular Disease

## 2020-12-08 ENCOUNTER — Other Ambulatory Visit: Payer: Self-pay | Admitting: *Deleted

## 2020-12-08 LAB — ANTIPHOSPHOLIPID SYNDROME EVAL, BLD
Anticardiolipin IgA: <9 APL U/mL (ref 0–11)
Anticardiolipin IgG: <9 GPL U/mL (ref 0–14)
Anticardiolipin IgM: 40 MPL U/mL — ABNORMAL HIGH (ref 0–12)
DRVVT: 46 s (ref 0.0–47.0)
PTT Lupus Anticoagulant: 38 s (ref 0.0–51.9)
Phosphatydalserine, IgA: <1 APS Units (ref 0–19)
Phosphatydalserine, IgG: <9 Units (ref 0–30)
Phosphatydalserine, IgM: 16 Units (ref 0–30)

## 2020-12-08 LAB — KAPPA/LAMBDA LIGHT CHAINS
Kappa free light chain: 69.9 mg/L — ABNORMAL HIGH (ref 3.3–19.4)
Kappa, lambda light chain ratio: 2.44 — ABNORMAL HIGH (ref 0.26–1.65)
Lambda free light chains: 28.6 mg/L — ABNORMAL HIGH (ref 5.7–26.3)

## 2020-12-08 LAB — ALDOSTERONE + RENIN ACTIVITY W/ RATIO
ALDO / PRA Ratio: <5.3 (ref 0.0–30.0)
Aldosterone: <1 ng/dL (ref 0.0–30.0)
PRA LC/MS/MS: 0.188 ng/mL/hr (ref 0.167–5.380)

## 2020-12-08 LAB — PROTEIN ELECTROPHORESIS, SERUM
A/G Ratio: 1 (ref 0.7–1.7)
Albumin ELP: 2.9 g/dL (ref 2.9–4.4)
Alpha-1-Globulin: 0.3 g/dL (ref 0.0–0.4)
Alpha-2-Globulin: 0.8 g/dL (ref 0.4–1.0)
Beta Globulin: 0.9 g/dL (ref 0.7–1.3)
Gamma Globulin: 1 g/dL (ref 0.4–1.8)
Globulin, Total: 2.9 g/dL (ref 2.2–3.9)
Total Protein ELP: 5.8 g/dL — ABNORMAL LOW (ref 6.0–8.5)

## 2020-12-08 NOTE — Progress Notes (Signed)
Can inform the patient that one of the blood tests results for abnormal clotting from his recent hospitalization for stroke is abnormal and that I will need to repeat this when he sees me for follow-up in the clinic.  Ask him to make a follow-up appointment to see me in 6 to 8 weeks

## 2020-12-08 NOTE — Patient Outreach (Signed)
[facility]) Care Management  12/08/2020  Rodney Rose [birth year] [id number]   RED ON EMMI ALERT - Stroke Day # 1 Date: 12/12 Red Alert Reason: Not scheduled follow up appointment   Outreach attempt #1, successful.  Identity verified.  This care manager introduced self and stated purpose of call.  [facility] care management services explained.    Member report he is doing better than prior to admission and at discharge.  State he does not have any deficits from the stroke, aware that outpatient PT was ordered and he will receive call to schedule sessions.  He is independent in ADLs but lives with his mother and sister who provide help when needed.    He does not have a follow up scheduled with neurology yet, state he was told that they would call to schedule.  Advised of where to find contact information (page number from AVS provided) and told to call if he hasn't received a call by Wednesday.  He now has a new PCP assigned through the Internal Medicine Clinic, will have first appointment on 12/16.  Also has cardiology appointment scheduled for 2/8.  Denies needing transportation for office visits.  Discussed management of HTN and DM in effort to decrease risk of recurrent stroke, verbalizes understanding but does not have BP monitor or glucose meter for daily monitoring.  He will try to purchase BP monitor from store and will request glucose meter during office visit this week.  He is able to verbalize proper diet management for both conditions.  Medications reviewed, report taking them as instructed.   New PCP office has chronic care management within office, will collaborate for plan of care.  Plan: RN CM will follow up with member within the next 2 weeks.  Will send EMMI education regarding stroke, DM, and HTN management.  Kemper Durie, [place], MSN [facility] Care Management  [facility] Manager [phone]

## 2020-12-10 ENCOUNTER — Ambulatory Visit (HOSPITAL_COMMUNITY)
Admission: EM | Admit: 2020-12-10 | Discharge: 2020-12-10 | Disposition: A | Discharge status: Home / Self Care | Payer: BLUE CROSS/BLUE SHIELD | Attending: Emergency Medicine | Admitting: Emergency Medicine

## 2020-12-10 ENCOUNTER — Other Ambulatory Visit: Payer: Self-pay

## 2020-12-10 ENCOUNTER — Encounter (HOSPITAL_COMMUNITY): Payer: Self-pay

## 2020-12-10 ENCOUNTER — Telehealth: Payer: Self-pay | Admitting: Emergency Medicine

## 2020-12-10 ENCOUNTER — Ambulatory Visit (INDEPENDENT_AMBULATORY_CARE_PROVIDER_SITE_OTHER): Payer: BLUE CROSS/BLUE SHIELD

## 2020-12-10 DIAGNOSIS — G8911 Acute pain due to trauma: Secondary | ICD-10-CM

## 2020-12-10 DIAGNOSIS — M25532 Pain in left wrist: Secondary | ICD-10-CM

## 2020-12-10 DIAGNOSIS — I10 Essential (primary) hypertension: Secondary | ICD-10-CM

## 2020-12-10 DIAGNOSIS — S63502A Unspecified sprain of left wrist, initial encounter: Secondary | ICD-10-CM | POA: Diagnosis not present

## 2020-12-10 NOTE — ED Triage Notes (Signed)
Pt c/o left hand pain x 1 week. Pt states he took ibuprofen he began to have pain but states it did not relieve the pain. Pt states he iced his hand. 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 Pt states the cooler fell back on his hand.

## 2020-12-10 NOTE — Discharge Instructions (Addendum)
Your x-ray was negative for fracture.  I suspect that you have sprained your wrist.  Ace wrap, ice, may take at 1000 mg of Tylenol 3-4 times a day as needed for pain.  Your blood pressure was also elevated today.  Keep an eye on it, continue your medications and follow-up with [facility] internal medicine clinic as scheduled tomorrow.  Decrease your salt intake. diet and exercise will lower your blood pressure significantly. It is important to keep your blood pressure under good control, as having a elevated blood pressure for prolonged periods of time significantly increases your risk of stroke, heart attacks, kidney damage, eye damage, and other problems. Measure your blood pressure once a day, preferably at the same time every day. Keep a log of this and bring it to your next doctor's appointment.  Bring your blood pressure cuff as well. Return immediately to the ER if you start having chest pain, headache, problems seeing, problems talking, problems walking, if you feel like you're about to pass out, if you do pass out, if you have a seizure, or for any other concerns.  Go to www.goodrx.com to look up your medications. This will give you a list of where you can find your prescriptions at the most affordable prices. Or ask the pharmacist what the cash price is, or if they have any other discount programs available to help make your medication more affordable. This can be less expensive than what you would pay with insurance.

## 2020-12-10 NOTE — Telephone Encounter (Signed)
Called patient and discussed Dr. Marlis Edelson review of lab.  Patient was agreeable to setting up office visit on 02/05/20 @ 0830.  Patient expressed appreciation.  Aware to arrive by 8 am.

## 2020-12-10 NOTE — ED Provider Notes (Signed)
HPI  SUBJECTIVE:  Rodney Rose is a right-handed 44 y.o. male who presents with left hand swelling, hand and wrist pain after a heavy cart slipped, hitting his palm and hyper extending his wrist.  He has residual numbness and weakness in his left hand and wrist from a recent stroke.  He denies change in this.  No erythema, bruising.  He tried ice which helps with the swelling and ibuprofen.  Symptoms are worse when he tries to use his hand.  States that he is compliant with all previous blood pressure medications, but he took them this morning.  He denies headache, blurry vision, chest pain or shortness of breath, nausea, tearing pain going through to his back, abdominal pain, lower extremity edema, anuria, hematuria, syncope, seizures.  He does not measure his blood pressure at home.  He has a past medical history of CVA on 12/01/2020, hypertension on amlodipine, losartan hydrochlorothiazide and carvedilol, he continues to smoke.  He is a diabetic, chronic kidney disease stage III, has bipolar and schizophrenia.  He has follow-up at the [facility] internal medicine clinic on 12/16.  Patient is a challenging historian, at first he described left hand numbness, weakness, but then upon further questioning, clarified that this is residual from his stroke.   Past Medical History:  Diagnosis Date  . Bipolar disorder (HCC)    Dr. Georgia Lopes with [facility] Psychiatry  . Chronic back pain   . Hypertension 2017  . Schizophrenia [facility])     Past Surgical History:  Procedure Laterality Date  . BUBBLE STUDY  12/05/2020   Procedure: BUBBLE STUDY;  Surgeon: Wendall Stade, MD;  Location: [facility] ENDOSCOPY;  Service: Cardiovascular;;  . NO PAST SURGERIES  10/2018  . TEE WITHOUT CARDIOVERSION N/A 12/05/2020   Procedure: TRANSESOPHAGEAL ECHOCARDIOGRAM (TEE);  Surgeon: Wendall Stade, MD;  Location: [facility] ENDOSCOPY;  Service: Cardiovascular;  Laterality: N/A;    Family History  Problem Relation Age of Onset  .  Hypertension Mother   . COPD Father   . Asthma Sister   . Heart disease Neg Hx   . Stroke Neg Hx   . Diabetes Neg Hx     Social History   Tobacco Use  . Smoking status: Current Every Day Smoker    Packs/day: 1.00    Years: 6.00    Pack years: 6.00    Types: Cigarettes  . Smokeless tobacco: Never Used  Vaping Use  . Vaping Use: Never used  Substance Use Topics  . Alcohol use: No  . Drug use: No    No current facility-administered medications for this encounter.  Current Outpatient Medications:  .  amLODipine (NORVASC) 10 MG tablet, Take 1 tablet (10 mg total) by mouth daily., Disp: 30 tablet, Rfl: 0 .  aspirin EC 81 MG EC tablet, Take 1 tablet (81 mg total) by mouth daily. Swallow whole., Disp: 30 tablet, Rfl: 11 .  atorvastatin (LIPITOR) 80 MG tablet, Take 1 tablet (80 mg total) by mouth daily., Disp: 30 tablet, Rfl: 0 .  carvedilol (COREG) 12.5 MG tablet, Take 1 tablet (12.5 mg total) by mouth 2 (two) times daily with a meal., Disp: 60 tablet, Rfl: 0 .  losartan-hydrochlorothiazide (HYZAAR) 100-12.5 MG tablet, Take 1 tablet by mouth daily., Disp: 30 tablet, Rfl: 0 .  clopidogrel (PLAVIX) 75 MG tablet, Take 1 tablet (75 mg total) by mouth daily., Disp: 18 tablet, Rfl: 0 .  glipiZIDE-metformin (METAGLIP) 5-500 MG tablet, Take 2 tablets by mouth 2 (two) times daily before  a meal., Disp: 60 tablet, Rfl: 1 .  haloperidol (HALDOL) 5 MG tablet, Take 1 tablet (5 mg total) by mouth at bedtime., Disp: 30 tablet, Rfl: 0  Allergies  Allergen Reactions  . Penicillins Swelling     ROS  As noted in HPI.   Physical Exam  BP (!) 185/107 (BP Location: Left Arm)   Pulse 91   Temp 98.5 F (36.9 C) (Oral)   Resp 20   SpO2 98%    BP Readings from Last 3 Encounters:  12/10/20 (!) 185/107  12/05/20 (!) 151/73  12/01/20 (!) 188/84    Constitutional: Well developed, well nourished, no acute distress Eyes:  EOMI, conjunctiva normal bilaterally HENT: Normocephalic,  atraumatic,mucus membranes moist Respiratory: Normal inspiratory effort Cardiovascular: Normal rate GI: nondistended skin: No rash, skin intact Musculoskeletal: Swelling dorsum left hand.  Sensation light touch and temperature intact in median/radial/ulnar nerve distribution in the entire hand.  Patient has difficulty with fine motor movements of the hand and wrist, grip strength 4/5 compared to right side.  RP 2+.  Positive tenderness over the distal radius and ulna, dorsum of the wrist.  No tenderness over the snuffbox.  No tenderness over the carpals, metacarpals, phalanges.  No tenderness over the forearm, elbow.  No bruising.  Skin intact. Neurologic: Alert & oriented x 3, left sided facial droop, patient states that this has not changed. Psychiatric: Speech and behavior appropriate   ED Course   Medications - No data to display  Orders Placed This Encounter  Procedures  . DG Wrist Complete Left    Standing Status:   Standing    Number of Occurrences:   1    Order Specific Question:   Reason for Exam (SYMPTOM  OR DIAGNOSIS REQUIRED)    Answer:   Status post trauma.  Positive distal radial ulnar tenderness.  Rule out fracture, dislocation  . Apply ace wrap    Left wrist    Standing Status:   Standing    Number of Occurrences:   1    No results found for this or any previous visit (from the past 24 hour(s)). DG Wrist Complete Left  Result Date: 12/10/2020 CLINICAL DATA:  Pain following trauma EXAM: LEFT WRIST - COMPLETE 3+ VIEW COMPARISON:  None. FINDINGS: Frontal, oblique, lateral, and ulnar deviation scaphoid images were obtained. There is no fracture or dislocation. No appreciable joint space narrowing or erosion. IMPRESSION: No fracture or dislocation.  No appreciable arthropathy. Electronically Signed   By: Bretta Bang III M.D.   On: 12/10/2020 13:53    ED Clinical Impression  1. Sprain of left wrist, initial encounter   2. Essential hypertension      ED  Assessment/Plan  Recent hospital records reviewed.  Additional medical history obtained as noted in HPI  1.  Left hand swelling/wrist tenderness post trauma.  Will obtain x-ray left wrist as this is where he is tender and unsure as to how reliable his history is.  Deferring imaging of the hand because there is no tenderness over the entire hand.  He does have some weakness with the hand and wrist, but he states that this is not new or changed since he got out of the hospital.  He is otherwise neurovascularly intact, sensation light touch and temperature is intact.  He has good strength in his elbows and shoulders.  If negative, will place an Ace wrap.  Tylenol, ice  Reviewed imaging independently.  No fracture, dislocation.  See radiology report for details  Patient with a left wrist sprain.  X-ray normal. plan as above  2.  Hypertension.  Patient currently denies having any symptoms.  Discussed with him that having it elevated for prolonged periods of time significantly increases his risk for stroke and further kidney disease.  He states he is compliant with his medications.  He states that he has follow-up with [facility] internal medicine clinic tomorrow.  Advised him to buy a blood pressure cuff so he can keep track of it at home.  Discussed imaging, MDM, treatment plan, and plan for follow-up with patient. Discussed sn/sx that should prompt return to the ED. patient agrees with plan.   No orders of the defined types were placed in this encounter.   *This clinic note was created using Dragon dictation software. Therefore, there may be occasional mistakes despite careful proofreading.   ?    Domenick Gong, MD 12/11/20 1006

## 2020-12-10 NOTE — Telephone Encounter (Signed)
-----   Message from Micki Riley, MD sent at 12/08/2020  4:46 PM EST ----- Can inform the patient that one of the blood tests results for abnormal clotting from his recent hospitalization for stroke is abnormal and that I will need to repeat this when he sees me for follow-up in the clinic.  Ask him to make a follow-up appointment to see me in 6 to 8 weeks

## 2020-12-11 ENCOUNTER — Ambulatory Visit: Payer: BLUE CROSS/BLUE SHIELD

## 2020-12-11 ENCOUNTER — Other Ambulatory Visit: Payer: Self-pay

## 2020-12-11 ENCOUNTER — Telehealth: Payer: Self-pay | Admitting: *Deleted

## 2020-12-11 ENCOUNTER — Encounter: Payer: Self-pay | Admitting: Internal Medicine

## 2020-12-11 ENCOUNTER — Telehealth: Payer: Self-pay

## 2020-12-11 ENCOUNTER — Ambulatory Visit (INDEPENDENT_AMBULATORY_CARE_PROVIDER_SITE_OTHER): Payer: BLUE CROSS/BLUE SHIELD | Admitting: Internal Medicine

## 2020-12-11 VITALS — BP 154/78 | HR 88 | Temp 98.3°F | Ht 74.0 in | Wt 213.2 lb

## 2020-12-11 DIAGNOSIS — M7989 Other specified soft tissue disorders: Secondary | ICD-10-CM | POA: Diagnosis not present

## 2020-12-11 DIAGNOSIS — E1122 Type 2 diabetes mellitus with diabetic chronic kidney disease: Secondary | ICD-10-CM

## 2020-12-11 DIAGNOSIS — N183 Chronic kidney disease, stage 3 unspecified: Secondary | ICD-10-CM | POA: Insufficient documentation

## 2020-12-11 DIAGNOSIS — I129 Hypertensive chronic kidney disease with stage 1 through stage 4 chronic kidney disease, or unspecified chronic kidney disease: Secondary | ICD-10-CM

## 2020-12-11 DIAGNOSIS — N1832 Chronic kidney disease, stage 3b: Secondary | ICD-10-CM

## 2020-12-11 DIAGNOSIS — I639 Cerebral infarction, unspecified: Secondary | ICD-10-CM | POA: Diagnosis not present

## 2020-12-11 DIAGNOSIS — Z23 Encounter for immunization: Secondary | ICD-10-CM | POA: Diagnosis not present

## 2020-12-11 DIAGNOSIS — E1121 Type 2 diabetes mellitus with diabetic nephropathy: Secondary | ICD-10-CM

## 2020-12-11 DIAGNOSIS — E119 Type 2 diabetes mellitus without complications: Secondary | ICD-10-CM

## 2020-12-11 DIAGNOSIS — I10 Essential (primary) hypertension: Secondary | ICD-10-CM

## 2020-12-11 DIAGNOSIS — I6300 Cerebral infarction due to thrombosis of unspecified precerebral artery: Secondary | ICD-10-CM

## 2020-12-11 MED ORDER — AMLODIPINE BESYLATE 10 MG PO TABS: 10.0000 mg | ORAL_TABLET | Freq: Every day | ORAL | 3 refills | Status: DC

## 2020-12-11 MED ORDER — METFORMIN HCL 1000 MG PO TABS: 1000.0000 mg | ORAL_TABLET | Freq: Every day | ORAL | 11 refills | Status: DC

## 2020-12-11 MED ORDER — DAPAGLIFLOZIN PROPANEDIOL 5 MG PO TABS: 5.0000 mg | ORAL_TABLET | Freq: Every day | ORAL | 3 refills | Status: DC

## 2020-12-11 MED ORDER — LOSARTAN POTASSIUM-HCTZ 100-25 MG PO TABS
1.0000 | ORAL_TABLET | Freq: Every day | ORAL | 3 refills | Status: DC
Start: 2020-12-11 — End: 2020-12-22

## 2020-12-11 MED ORDER — ATORVASTATIN CALCIUM 80 MG PO TABS: 80.0000 mg | ORAL_TABLET | Freq: Every day | ORAL | 3 refills | Status: DC

## 2020-12-11 NOTE — Assessment & Plan Note (Signed)
Left hand swelling: He states that about 2 weeks ago he had an injury at work.  He states that there was a cart of meat that was about to fall and he tried to prevent this from happening however the object was too heavy while he was in motion of preventing the carts from falling.  Since then, he has had swelling of his left hand.  Initially had pain but presently is just the swelling.  He had an x-ray of his left hand performed at an urgent care yesterday which did not show any obvious fractures.  He did have an ace wrap on his wrist and not the hand  On physical exam today, he does have swelling of his hand with no obvious weakness or point tenderness that was elicited.      Assessment and plan: This could be a sequelae of a sprain.  Patient did have an Ace wrap around his wrist that could limit outflow of blood from his hand.  Other possibility could be a DVT though he did have an intact pulse.  -Advised on proper Ace wrapping of his hand -Follow-up left upper extremity ultrasound -Follow-up in 2 weeks

## 2020-12-11 NOTE — Progress Notes (Signed)
Internal Medicine Clinic Attending  Case discussed with Dr. Agyei at the time of the visit.  We reviewed the resident's history and exam and pertinent patient test results.  I agree with the assessment, diagnosis, and plan of care documented in the resident's note.  Kaspar Albornoz, M.D., Ph.D.  

## 2020-12-11 NOTE — Assessment & Plan Note (Signed)
Type 2 diabetes mellitus: Hemoglobin A1c in the hospital was 5.5%  Plan: -Discontinue glipizide-Metformin 09-999 mg twice daily -Start Metformin 1000 mg twice daily AND Farxiga 5 mg daily

## 2020-12-11 NOTE — Assessment & Plan Note (Signed)
CKD stage IIIb with proteinuria: He was discovered to have an elevated protein-creatinine ratio of 3.8, 6 g 24-hour urine protein during hospital admission.  Work-up including HIV was unremarkable, he had an elevated anticardiolipin IgM which Dr. Leonie Man plans to repeat during his follow-up, ANA unremarkable sickle cell screen unremarkable, elevated ESR, hepatitis B core antibody, hepatitis B surface antigen, hepatitis C antibody were all unremarkable.  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  Protein electrophoresis did not show evidence of M spike.  Plan: -Continue losartan 100 mg daily -Follow-up with nephrology.  Not sure if they will consider biopsy

## 2020-12-11 NOTE — Assessment & Plan Note (Signed)
Cryptogenic stroke: Recently admitted to the hospital from December 01, 2020 to December 05, 2020 when he presented with left-sided weakness.  He was found to have an acute infarct involving the right basal ganglia and adjacent white matter with more subacute infarcts of the right caudate head.  He was discharged on dual antiplatelet therapy for 3 weeks then aspirin alone indefinitely.  Plan: -Continue aspirin 81 mg daily, Plavix and 5 mg daily until December 31st then aspirin indefinitely -Follow-up with neurology

## 2020-12-11 NOTE — Chronic Care Management (AMB) (Signed)
  Care Management   Note  12/11/2020 Name: Tramell Piechota Rueger MRN: [id number] DOB: [birth year]  Koren Bound Tidd is a 44 y.o. year old male who is a primary care patient of Patient, No PCP Yvette Rack, MD and is actively engaged with the care management team. I reached out to [employer] by phone today to assist with re-scheduling an initial visit with the BSW  Follow up plan: Unsuccessful telephone outreach attempt made. A HIPAA compliant phone message was left for the patient providing contact information and requesting a return call. The care management team will reach out to the patient again over the next 7 days. If patient returns call to provider office, please advise to call Embedded Care Management Care Guide Gwenevere Ghazi at [phone].  Gwenevere Ghazi  Care Guide, Embedded Care Coordination [facility] Management  Direct Dial: [phone]

## 2020-12-11 NOTE — Assessment & Plan Note (Signed)
Hypertension: Not at goal  BP Readings from Last 3 Encounters:  12/11/20 (!) 154/78  12/10/20 (!) 185/107  12/05/20 (!) 151/73    -Continue amlodipine 10 mg daily -Increase losartan-HCTZ 100-25 mg daily -Coreg 12.5 mg twice daily  -Return to clinic in 2 weeks.  If not at goal, can increase Coreg

## 2020-12-11 NOTE — Telephone Encounter (Signed)
  Chronic Care Management     12/11/2020 Name: Rodney Rose MRN: [id number] DOB: May 25, 1976  Referred by: Patient, No Pcp Per Reason for referral : Care Coordination   Rodney Rose is enrolled in a Managed Medicaid Health Plan: No  CCM BSW intended to meet with patient today for program introduction/enrollment, however, he left clinic before he could be seen.   Follow Up Plan: Have requested that Care Guide, Gwenevere Ghazi, contact patient for program introduction and scheduling of initial assessment if patient consents.     [name], BSW Embedded Care Coordination Social Worker [facility] Internal Medicine Center [phone]

## 2020-12-11 NOTE — Progress Notes (Signed)
   CC: Follow-up stroke, hypertension, diabetes  HPI:  [name] is a 44 y.o. with medical history listed below presented to follow-up after recent hospitalization.  Please see problem based charting for further details  Past Medical History:  Diagnosis Date  . Bipolar disorder (HCC)    Dr. Georgia Lopes with [facility] Psychiatry  . Chronic back pain   . Hypertension 2017  . Schizophrenia (HCC)    Review of Systems:  As per HPI  Physical Exam:  Vitals:   12/11/20 1101 12/11/20 1133  BP: (!) 169/87 (!) 154/78  Pulse: 89 88  Temp: 98.3 F (36.8 C)   SpO2: 100%   Weight: 213 lb 3.2 oz (96.7 kg)   Height: 6\' 2"  (1.88 m)    Physical Exam Vitals and nursing note reviewed.  Constitutional:      Appearance: Normal appearance.  Eyes:     Extraocular Movements: Extraocular movements intact.     Conjunctiva/sclera: Conjunctivae normal.  Cardiovascular:     Rate and Rhythm: Normal rate.     Heart sounds: Normal heart sounds.  Pulmonary:     Effort: No respiratory distress.     Breath sounds: Normal breath sounds.  Abdominal:     General: Bowel sounds are normal.  Neurological:     Mental Status: He is alert.     Sensory: No sensory deficit.     Motor: No weakness.     Coordination: Coordination normal.     Gait: Gait normal.     Comments: Neurologic exam: Cranial Nerves: III, IV, VI: Extra-occular motions intact bilaterally V, VII: Face symmetric, sensation intact in all 3 divisions  IX, X: palate rises symmetrically XI: Head turn and shoulder shrug normal bilaterally  XII: tongue midline  Motor: Strength 5/5 on all upper and lower extremities, bulk muscle and tone are normal Gait:Normal   Sensory: Light touch intact and symmetric bilaterally  Coordination: There is no dysmetria on finger-to-nose. Rapid alternating movement test normal. Psychiatric: Normal mood and affect  Psychiatric:        Mood  and Affect: Mood normal.        Behavior: Behavior normal.     Assessment & Plan:   See Encounters Tab for problem based charting.  Patient discussed with Dr. 

## 2020-12-11 NOTE — Patient Instructions (Signed)
Rodney Rose,  It was a pleasure taking care of you here in the clinic.   1. As we discussed, I want you to wrap your left hand with an Ace wrap and not your wrist. I would like to see you back in the clinic in about 2 weeks to check up on your wrist. I am also ordering an ultrasound to make sure there is no blood clot.  2. Stop the combination glipizide-Metformin. I have prescribed you a new medication for Metformin and Farxiga for diabetes.  3. Stop the combination losartan-hydrochlorothiazide. I have prescribed you a new increased dose of losartan-hydrochlorothiazide.  Take care! Dr. Dortha Schwalbe  Please call the internal medicine center clinic if you have any questions or concerns, we may be able to help and keep you from a long and expensive emergency room wait. Our clinic and after hours phone number is [phone], the best time to call is Monday through Friday 9 am to 4 pm but there is always someone available 24/7 if you have an emergency. If you need medication refills please notify your pharmacy one week in advance and they will send [place] a request.   If you have not gotten the COVID vaccine, I recommend doing so:  You may get it at your local [facility] or [facility] OR To schedule an appointment for a COVID vaccine or be added to the vaccine wait list: Go to [email or web address]   OR Go to [email or web address]                  OR Call [phone]                                     OR Call [phone] and select Option 2

## 2020-12-16 NOTE — Chronic Care Management (AMB) (Signed)
  Care Management   Note  12/16/2020 Name: ELIC VENCILL MRN: [id number] DOB: July 18, 1976  Koren Bound Murin is a 44 y.o. year old male who is a primary care patient of Patient, PCP Yvette Rack, MD. Per. I reached out to [employer] by phone today in response to a referral sent by Mr. Markell Sciascia Rybicki's PCP Yvette Rack, MD.  Mr. Corkery was given information about care management services today including:  1. Care management services include personalized support from designated clinical staff supervised by his physician, including individualized plan of care and coordination with other care providers 2. 24/7 contact phone numbers for assistance for urgent and routine care needs. 3. The patient may stop care management services at any time by phone call to the office staff.  Patient agreed to services and verbal consent obtained.   Follow up plan: Telephone appointment with care management team member scheduled for:12/24/2020  [facility] Guide, Embedded Care Coordination [facility]  Care Management  Direct Dial: [phone]

## 2020-12-18 ENCOUNTER — Telehealth: Payer: Self-pay

## 2020-12-18 NOTE — Telephone Encounter (Signed)
[employer] Event Monitor registered to be mailed to pt's home address. I called pt and went over brief instructions with him.

## 2020-12-18 NOTE — Telephone Encounter (Signed)
RTC, patient states his left hand is still swollen and hurting.  He states he is supposed to go back to work tomorrow and needs a work excuse because he is still unable to work.  He states he has a f/u appt in January to reevaluate his hand.  Pt was seen on 12/11/20 by Dr. Dortha Schwalbe and per Dr. Verdell Face office note, pt to f/u in 2 weeks.  Informed patient he will need an appointment next week for reevaluation and can discuss work note with MD at that time.  Appt made for 12/22/20 @3 :45/ Dr. .  He verbalized understanding. SChaplin, RN,BSN

## 2020-12-18 NOTE — Telephone Encounter (Signed)
Requesting a note for work, pt states he still having hand pain. Please call back.

## 2020-12-22 ENCOUNTER — Other Ambulatory Visit: Payer: Self-pay

## 2020-12-22 ENCOUNTER — Ambulatory Visit (INDEPENDENT_AMBULATORY_CARE_PROVIDER_SITE_OTHER): Payer: BLUE CROSS/BLUE SHIELD | Admitting: Student

## 2020-12-22 ENCOUNTER — Other Ambulatory Visit: Payer: Self-pay | Admitting: *Deleted

## 2020-12-22 ENCOUNTER — Encounter: Payer: Self-pay | Admitting: Student

## 2020-12-22 ENCOUNTER — Encounter (INDEPENDENT_AMBULATORY_CARE_PROVIDER_SITE_OTHER): Payer: BLUE CROSS/BLUE SHIELD

## 2020-12-22 VITALS — BP 146/85 | HR 89 | Temp 99.0°F | Ht 74.0 in | Wt 202.5 lb

## 2020-12-22 DIAGNOSIS — I639 Cerebral infarction, unspecified: Secondary | ICD-10-CM

## 2020-12-22 DIAGNOSIS — I10 Essential (primary) hypertension: Secondary | ICD-10-CM | POA: Diagnosis not present

## 2020-12-22 DIAGNOSIS — R000 Tachycardia, unspecified: Secondary | ICD-10-CM | POA: Diagnosis not present

## 2020-12-22 DIAGNOSIS — M7989 Other specified soft tissue disorders: Secondary | ICD-10-CM

## 2020-12-22 MED ORDER — LOSARTAN POTASSIUM-HCTZ 100-25 MG PO TABS
1.0000 | ORAL_TABLET | Freq: Every day | ORAL | 3 refills | Status: DC
Start: 2020-12-22 — End: 2021-01-13

## 2020-12-22 NOTE — Progress Notes (Signed)
CC: Left wrist dysfunction  HPI:  [name] is a 44 y.o. male with a past medical history stated below and presents today for left wrist dysfunction. Please see problem based assessment and plan for additional details.  Past Medical History:  Diagnosis Date  . Bipolar disorder (HCC)    Dr. Georgia Lopes with [facility] Psychiatry  . Chronic back pain   . Hypertension 2017  . Schizophrenia [facility])     Current Outpatient Medications on File Prior to Visit  Medication Sig Dispense Refill  . amLODipine (NORVASC) 10 MG tablet Take 1 tablet (10 mg total) by mouth daily. 60 tablet 3  . aspirin EC 81 MG EC tablet Take 1 tablet (81 mg total) by mouth daily. Swallow whole. 30 tablet 11  . atorvastatin (LIPITOR) 80 MG tablet Take 1 tablet (80 mg total) by mouth daily. 60 tablet 3  . carvedilol (COREG) 12.5 MG tablet Take 1 tablet (12.5 mg total) by mouth 2 (two) times daily with a meal. 60 tablet 0  . clopidogrel (PLAVIX) 75 MG tablet Take 1 tablet (75 mg total) by mouth daily. 18 tablet 0  . dapagliflozin propanediol (FARXIGA) 5 MG TABS tablet Take 1 tablet (5 mg total) by mouth daily before breakfast. 30 tablet 3  . haloperidol (HALDOL) 5 MG tablet Take 1 tablet (5 mg total) by mouth at bedtime. 30 tablet 0  . metFORMIN (GLUCOPHAGE) 1000 MG tablet Take 1 tablet (1,000 mg total) by mouth daily with breakfast. 30 tablet 11  . [DISCONTINUED] Azilsartan-Chlorthalidone 40-12.5 MG TABS Take 1 tablet by mouth daily. (Patient not taking: Reported on 04/09/2019) 30 tablet 1  . [DISCONTINUED] glyBURIDE-metformin (GLUCOVANCE) 2.5-500 MG tablet Take 1 tablet by mouth 2 (two) times daily with a meal. 180 tablet 1   No current facility-administered medications on file prior to visit.    Family History  Problem Relation Age of Onset  . Hypertension Mother   . COPD Father   . Asthma Sister   . Heart disease Neg Hx   . Stroke Neg Hx   . Diabetes Neg Hx     Social History   Socioeconomic History  .  Marital status: Single    Spouse name: Not on file  . Number of children: Not on file  . Years of education: Not on file  . Highest education level: Not on file  Occupational History  . Not on file  Tobacco Use  . Smoking status: Current Every Day Smoker    Packs/day: 0.50    Years: 6.00    Pack years: 3.00    Types: Cigarettes  . Smokeless tobacco: Never Used  Vaping Use  . Vaping Use: Never used  Substance and Sexual Activity  . Alcohol use: No  . Drug use: No  . Sexual activity: Yes    Birth control/protection: Condom  Other Topics Concern  . Not on file  Social History Narrative   Lives with mother and sister.   In workers comp coverage currently.   10/2018.   Social Determinants of Health   Financial Resource Strain: Not on file  Food Insecurity: Not on file  Transportation Needs: Not on file  Physical Activity: Not on file  Stress: Not on file  Social Connections: Not on file  Intimate Partner Violence: Not on file    Review of Systems: ROS negative except for what is noted on the assessment and plan.  Vitals:   12/22/20 1602 12/22/20 1615  BP: (!) 159/85 (!) 146/85  Pulse: 90 89  Temp: 99 F (37.2 C)   TempSrc: Oral   SpO2: 97%   Weight: 202 lb 8 oz (91.9 kg)   Height: 6\' 2"  (1.88 m)      Physical Exam: Physical Exam Vitals and nursing note reviewed.  Constitutional:      General: He is not in acute distress.    Appearance: Normal appearance. He is normal weight. He is not ill-appearing or toxic-appearing.  HENT:     Head: Normocephalic and atraumatic.  Cardiovascular:     Rate and Rhythm: Normal rate and regular rhythm.     Pulses: Normal pulses.     Heart sounds: Normal heart sounds. No murmur heard. No friction rub. No gallop.   Pulmonary:     Effort: Pulmonary effort is normal. No respiratory distress.     Breath sounds: Normal breath sounds. No stridor. No wheezing, rhonchi or rales.  Musculoskeletal:     Right lower leg: No edema.      Left lower leg: No edema.  Skin:    General: Skin is warm and dry.  Neurological:     Mental Status: He is alert and oriented to person, place, and time. Mental status is at baseline.     Sensory: No sensory deficit.     Comments: Patient with what appears to be left wrist drop, he is unable to extend the wrist. It also appears as though he has no spatial awareness of the left wrist. His strength throughout is 5/5 ( interdigital, wrist flexion/extension, grip strength, elbow flexion/extension).   Abnormal left sided finger to nose. Left hand dysdiadochokinesia. Normal sensation. Minimal pain with palpation. Patient with normal radial pulse. Cap refill intact. Inability to flex left 2nd digit.   Psychiatric:        Mood and Affect: Mood normal.        Behavior: Behavior normal.      Assessment & Plan:   See Encounters Tab for problem based charting.  Patient seen with Dr. , D.O. [facility] Internal Medicine, PGY-1 Pager: [phone], Phone: [phone] Date 12/22/2020 Time 7:13 PM

## 2020-12-22 NOTE — Patient Outreach (Signed)
[facility]) Care Management  12/22/2020  Ziquan Fidel Evon 20-Aug-1976 [id number]   Call placed to member to follow up on recent new PCP visit.  State it went well, now needing refill for losartan to continue management of hypertension.  He has follow up today, will discuss refill at that time.  He is made aware that RNCM and CSW within PCP office will provide chronic care management going forward, verbalizes understanding.  This RNCM will close case at this time.  Kemper Durie, [place], MSN [facility] Care Management  [facility] Manager [phone]

## 2020-12-22 NOTE — Patient Instructions (Addendum)
Thank you, [name] for allowing [place] to provide your care today. Today we discussed left wrist dysfunction   We will be ordering a head CT, please be on the look out for a phone call to schedule this.    I have ordered the following labs for you:  Lab Orders  No laboratory test(s) ordered today    Referrals ordered today:   Referral Orders  No referral(s) requested today     I have ordered the following medication/changed the following medications:   Stop the following medications: Medications Discontinued During This Encounter  Medication Reason  . losartan-hydrochlorothiazide (HYZAAR) 100-25 MG tablet Reorder     Start the following medications: Meds ordered this encounter  Medications  . losartan-hydrochlorothiazide (HYZAAR) 100-25 MG tablet    Sig: Take 1 tablet by mouth daily.    Dispense:  30 tablet    Refill:  3     Follow up: After your CT Head    Should you have any questions or concerns please call the internal medicine clinic at [phone].     Thalia Bloodgood, D.O. [facility]

## 2020-12-22 NOTE — Assessment & Plan Note (Addendum)
Assessment: Early December patient was at work, attempted to catch large piece of falling meat and hurt his left hand. He was seen in the clinic on 12/16 for left wrist swelling. Unremarkable xray of the left hand/wrist. At the time was diagnosed with left wrist sprain.   Left wrist swelling has improved per the patient, however, he now has left wrist weakness and "floppy wrist." Initially appeared to be consistent with left wrist drop, but on further examination patient has 5/5 strength of wrist flexion and extension. Abnormal finger to nose and pronation/supination of the left wrist.   At this time I suspect patient had abnormal function of the left wrist from the injury, however, it was masked by his left wrist swelling. It appears to be some type of neurological damage in etiology. Difficult to assess dermatomal pattern, patient with only limited flexion of left 2nd digit and wrist drop with normal strength. No numbness or tingling. Due to patient's history of CVA, will start with head CT. If negative, will then progress down and image the C-spine and left upper extremity.   Plan: - CT head w/o contrast ordered - patient to follow up after results, if CT negative will further image cervical spine - consider EEG of upper extremity - consider left extremity MRI - left arm [place] ordered from prior visit not scheduled - work note written for 01/08/20 - given precautions if new neurological deficits arise for patient to go to ED/call 911

## 2020-12-23 ENCOUNTER — Other Ambulatory Visit: Payer: Self-pay | Admitting: Internal Medicine

## 2020-12-23 ENCOUNTER — Telehealth: Payer: Self-pay | Admitting: *Deleted

## 2020-12-23 DIAGNOSIS — I10 Essential (primary) hypertension: Secondary | ICD-10-CM

## 2020-12-23 MED ORDER — HYDROCHLOROTHIAZIDE 25 MG PO TABS: 25.0000 mg | ORAL_TABLET | Freq: Every day | ORAL | 1 refills | Status: DC

## 2020-12-23 MED ORDER — LOSARTAN POTASSIUM 100 MG PO TABS
100.0000 mg | ORAL_TABLET | Freq: Every day | ORAL | 1 refills | Status: DC
Start: 2020-12-23 — End: 2020-12-29

## 2020-12-23 NOTE — Telephone Encounter (Signed)
Call from pt's sister, Carollee Herter - went to pick up pt's med, Losartan-HCTZ, was told by the pharmacist that med is on backorder.   [facility] - stated to send alternative or 2 separate meds  Thanks

## 2020-12-23 NOTE — Telephone Encounter (Signed)
Called pt's sister - informed 2 separate rx for HCTZ and Losartan have been sent to the pharmacy. Also informed when combo pill becomes available, stop the two meds. Stated understanding.

## 2020-12-23 NOTE — Progress Notes (Signed)
Internal Medicine Clinic Attending  Case discussed with Dr. Katsadouros  At the time of the visit.  We reviewed the resident's history and exam and pertinent patient test results.  I agree with the assessment, diagnosis, and plan of care documented in the resident's note.  

## 2020-12-23 NOTE — Telephone Encounter (Signed)
Sent in the 2 separate medications for 1 month with 1 month refill.  Should hold them over until back order done.  Please make sure sister is aware to not take both the combination pill and the 2 solitary medications.  Thank you!

## 2020-12-24 ENCOUNTER — Telehealth: Payer: Self-pay

## 2020-12-24 ENCOUNTER — Telehealth: Payer: BLUE CROSS/BLUE SHIELD

## 2020-12-24 NOTE — Telephone Encounter (Signed)
  Chronic Care Management   Outreach Note  12/24/2020 Name: Rodney Rose MRN: [id number] DOB: [birth year]  Referred by: Steffanie Rainwater, MD Reason for referral : Care Coordination   Rodney Rose is enrolled in a Managed Medicaid Health Plan: No  Patient contacted for scheduled assessment but was not home during time of call and asked for appointment to be rescheduled.    Follow Up Plan: Informed patient that he will be contacted by [facility] Care Guide, Gwenevere Ghazi, for purpose of rescheduling.        [name], BSW Embedded Care Coordination Social Worker [facility] [phone]

## 2020-12-25 ENCOUNTER — Telehealth (HOSPITAL_COMMUNITY): Payer: Self-pay | Admitting: Pharmacist

## 2020-12-25 NOTE — Telephone Encounter (Signed)
Pharmacy Transitions of Care Follow-up Telephone Call  Date of discharge: 12/05/2020 Discharge Diagnosis: CVA  How have you been since you were released from the hospital? [facility], sprained wrist 12/15  Medication changes made at discharge: Yes  Medication changes obtained and verified? Yes    Medication Accessibility:  [facility]  Was the patient provided with refills on discharged medications? No  Have all prescriptions been transferred from [facility] to home pharmacy? No refills upon discharge.  Patient is only on ASA now since the 18 clopidogrel he was discharged with have been taken.  All other prescriptions he was discharged with [facility] by [facility] except carvedilol.  I am following up with IM about that one and will inform Rodney Rose when this is resolved.  Is the patient able to afford medications? Yes . Notable copays: $4 copays, will explore 90 day supply option . Eligible patient assistance: No    Medication Review:  CLOPIDOGREL (PLAVIX) has been completed.  No problems with bleeding. Follow-up Appointments:  PCP Hospital f/u appt confirmed? Yes Scheduled to see Rodney Rose on 02/03/21 @ 12:00 PM  Specialist Hospital f/u appt confirmed?Yes Scheduled to see Dr Pearlean Brownie on 02/04/21 @ 8:30 AM  If their condition worsens, is the pt aware to call PCP or go to the Emergency Dept.? Yes  Final Patient Assessment: Some difficulty with communication but after 2 calls I feel he has a good understanding of the above

## 2020-12-29 ENCOUNTER — Other Ambulatory Visit: Payer: Self-pay | Admitting: Student

## 2020-12-29 MED ORDER — CARVEDILOL 12.5 MG PO TABS: 12.5000 mg | ORAL_TABLET | Freq: Two times a day (BID) | ORAL | 2 refills | Status: DC

## 2020-12-29 MED ORDER — ASPIRIN 81 MG PO TBEC: 81.0000 mg | DELAYED_RELEASE_TABLET | Freq: Every day | ORAL | 11 refills | Status: DC

## 2020-12-29 NOTE — Progress Notes (Signed)
Informed by pharmacist the patient's prescriptions were not sent to his home pharmacy during his most recent hospital discharge.  Sending aspirin 81 mg daily and carvedilol 12.5 mg twice daily to [facility] [address].

## 2020-12-30 ENCOUNTER — Encounter: Payer: BLUE CROSS/BLUE SHIELD | Admitting: Student

## 2021-01-05 ENCOUNTER — Telehealth: Payer: Self-pay

## 2021-01-05 NOTE — Telephone Encounter (Signed)
Requesting another letter for work. Please call pt back.

## 2021-01-05 NOTE — Telephone Encounter (Signed)
I will write a work note from him, but he needs to complete the work-up for his hand swelling and weakness prior to coming into clinic for the next appointment. There are two imaging orders that he hasn't completed yet.

## 2021-01-05 NOTE — Telephone Encounter (Signed)
RTC, patient states his stay out of work note has expired today and he needs another letter written to stay out of work d/t left hand swelling (see letters).  Pt cancelled f/u appt on 12/30/20 w/ Dr. Laddie Aquas.  RN advised patient he will need a f/u for evaluation and an appt was offered for 01/08/21, pt declined this appt because it was too early in the morning, he is requesting a afternoon appt.  Next available afternoon appt is 01/13/21 @ 3:45 w/ Dr. Coe/red team which patient is requesting/appt made. Pt still requesting letter that he needs to stay out of work this week.  RN informed patient she will send request to MD, but MD may need to see patient for in-person visit before he can write extension letter, he verbalized understanding. SChaplin, RN,BSN

## 2021-01-05 NOTE — Telephone Encounter (Signed)
Requesting a letter for work. Please call pt back.  

## 2021-01-08 NOTE — Telephone Encounter (Signed)
After looking at Dr. Al Decant note, it looks like she wanted him to have his imaging done prior. How soon can he get the images?

## 2021-01-08 NOTE — Telephone Encounter (Signed)
Thank you Chili!  Forwarding to red team: Please see correspondence below.  Patient has a f/u with Dr. Marchia Bond on 01/13/21 and Dr. Cleaster Corin has extended his out of work letter until 01/14/21.   Would you like him to keep the f/u with Dr. Marchia Bond or wait until after his CT he will now have to obtain at a [facility]? Thank you, Taunja Brickner

## 2021-01-08 NOTE — Telephone Encounter (Signed)
This patient IS OON and must go to [facility].  He has been Education administrator and agreed to use their facility due to the Cost.  The patient is aware it will be sch in [facility] and their office will be contacting him to get sch.  Authorization and notes were faxed to [phone].

## 2021-01-09 NOTE — Telephone Encounter (Signed)
TC to patient, VM obtained and Hippa compliant message left to call nurse triage back.  Please see notes below: Patient has a f/u with Dr. Marchia Bond on 01/13/21, recommended that patient have images 1st, patient waiting on [facility] to set appt up.   RN needs to find out if appt was scheduled and tell pt to r/s appt with Dr. Marchia Bond after he has images  At [facility]. SChaplin, RN,BSN

## 2021-01-09 NOTE — Telephone Encounter (Signed)
This is not my patient, I agree with Dr. Cleaster Corin that he will need his CT imaging done prior to his appointment.  Therefore, if he is unable to get the CT images done we should reschedule his appointment for a later time.

## 2021-01-09 NOTE — Telephone Encounter (Signed)
Contacted [facility] Radiology [facility].  The patient has been called twice and left a message for him to call  back.  They have appointments ava in their [place] location and would like for him to call back to sch as soon as possible.  Called patient as well and N/A.

## 2021-01-10 ENCOUNTER — Other Ambulatory Visit: Payer: BLUE CROSS/BLUE SHIELD

## 2021-01-10 DIAGNOSIS — Z20822 Contact with and (suspected) exposure to covid-19: Secondary | ICD-10-CM

## 2021-01-10 IMAGING — MR MR HEAD W/O CM
6 of 10 series · 29 of 48 positions shown · non-contrast
Comparison: None.

CLINICAL DATA: Left-sided weakness and facial droop with abnormal
CT

EXAM:
MRI HEAD WITHOUT CONTRAST
TECHNIQUE: Multiplanar, multiecho pulse sequences of the brain and surrounding
structures were obtained without intravenous contrast.

[Series 3: DWI · axial · 3.0mm · 0.94mm/px · z∈[-78,+67]mm · 9 of 100 slices shown (1 of 2)]
[im 1/100]
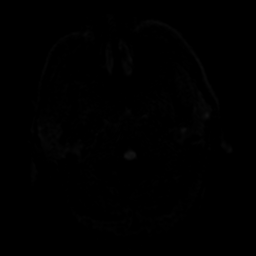
[im 13/100]
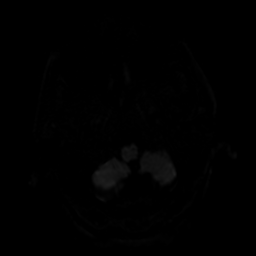
[im 25/100]
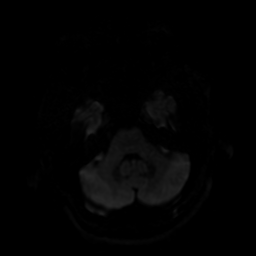
[im 38/100]
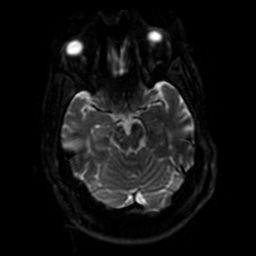
[im 50/100]
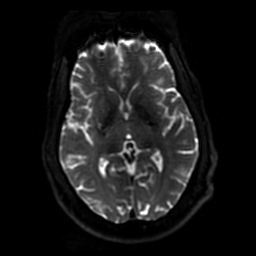
[im 62/100]
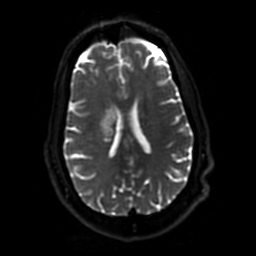
[im 75/100]
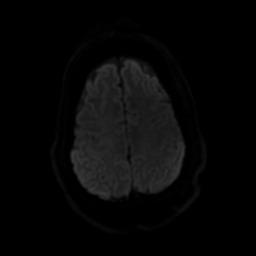
[im 87/100]
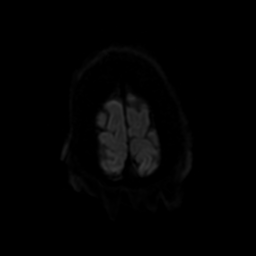
[im 100/100]
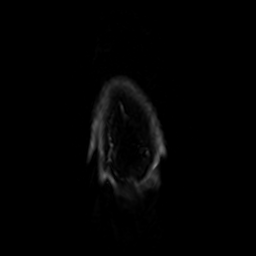

[Series 4: DWI · coronal · 4.0mm · 0.94mm/px · 7 of 74 slices shown (2 of 2)]
[im 1/74]
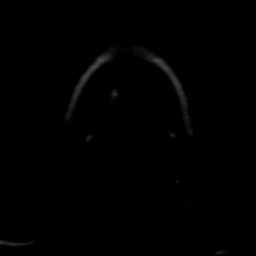
[im 13/74]
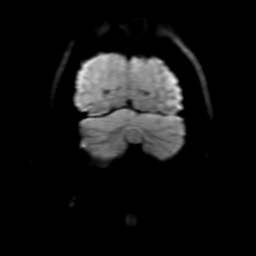
[im 25/74]
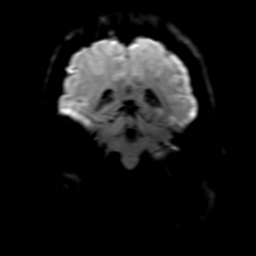
[im 37/74]
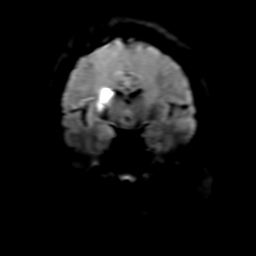
[im 49/74]
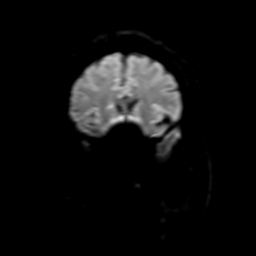
[im 61/74]
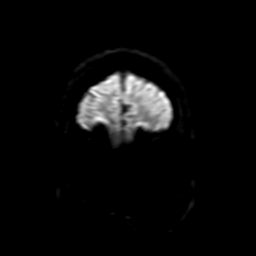
[im 74/74]
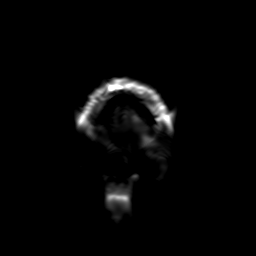

[Series 5: FLAIR · sagittal · 5.0mm · 0.23mm/px · 2 of 23 slices shown (1 of 2)]
[im 1/23]
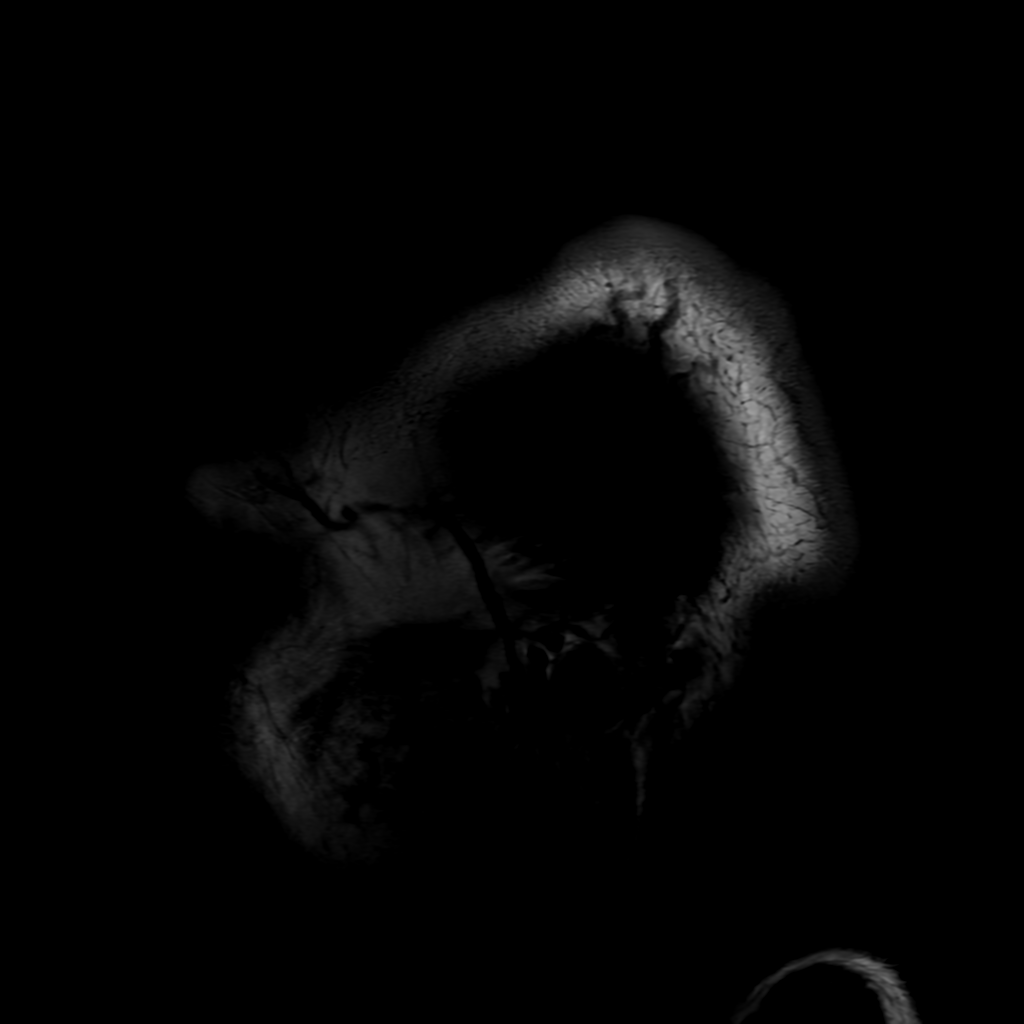
[im 23/23]
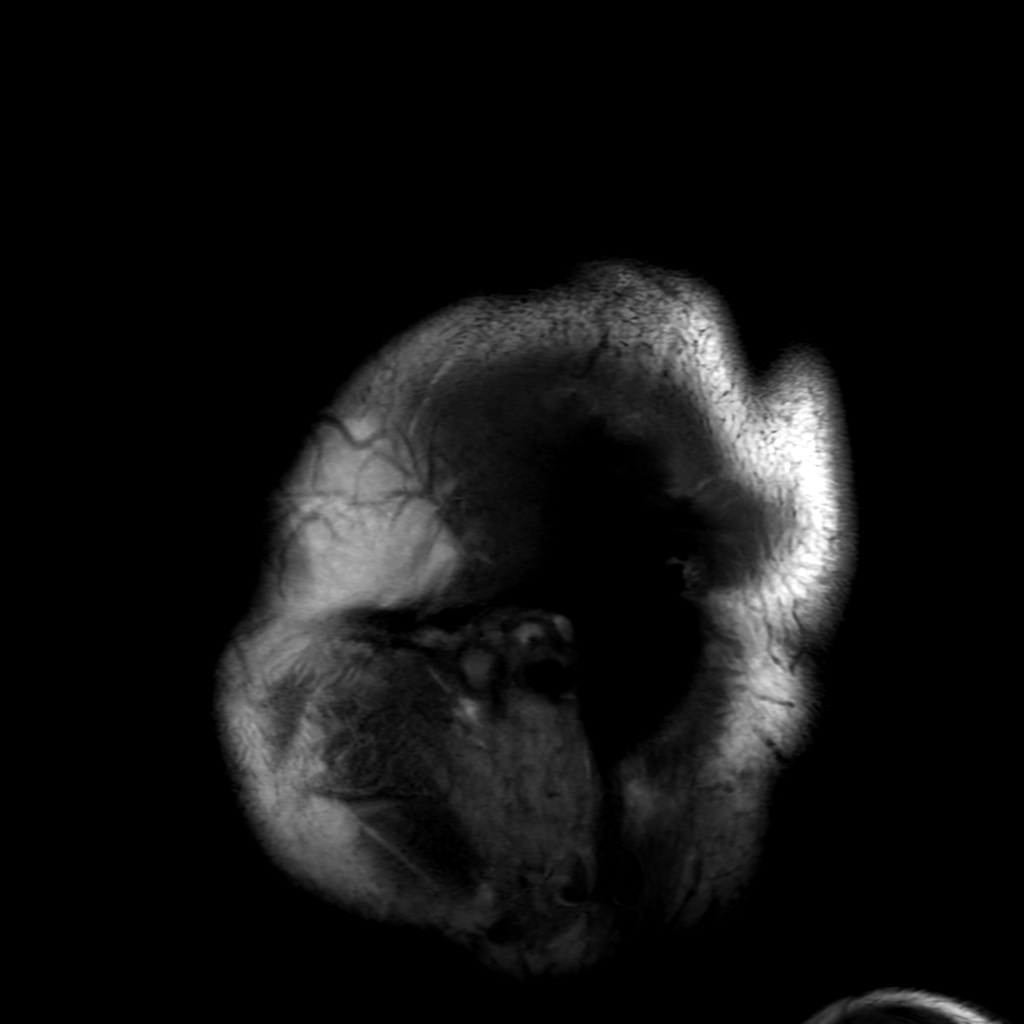

[Series 7: FLAIR · axial · 3.0mm · 0.45mm/px · z∈[-45,+87]mm · 2 of 23 slices shown (2 of 2)]
[im 1/23]
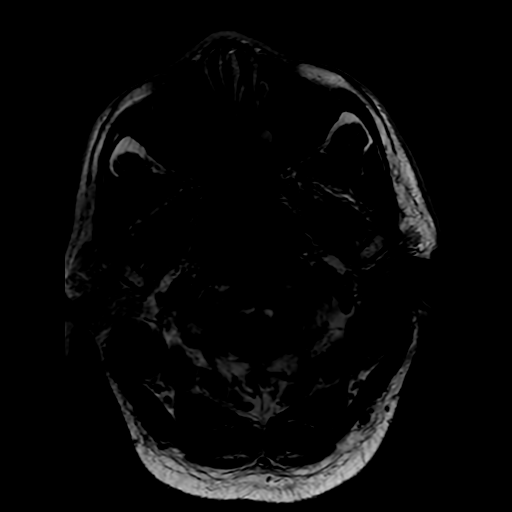
[im 23/23]
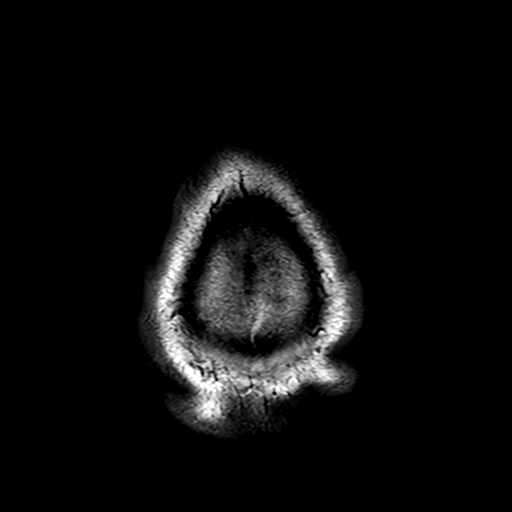

[Series 350: ADC · axial · 3.0mm · 0.94mm/px · z∈[-78,+67]mm · 5 of 49 slices shown (1 of 2)]
[im 1/49]
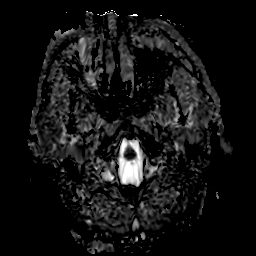
[im 13/49]
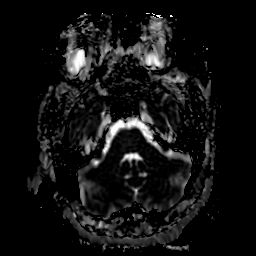
[im 25/49]
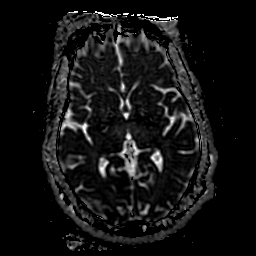
[im 37/49]
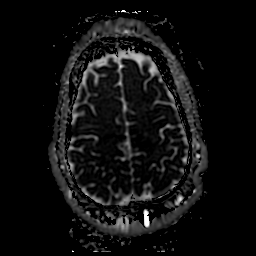
[im 49/49]
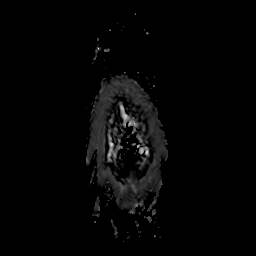

[Series 450: ADC · coronal · 4.0mm · 0.94mm/px · 4 of 37 slices shown (2 of 2)]
[im 1/37]
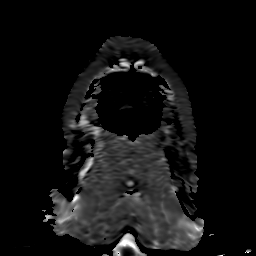
[im 13/37]
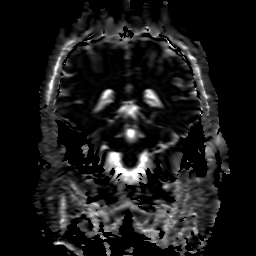
[im 25/37]
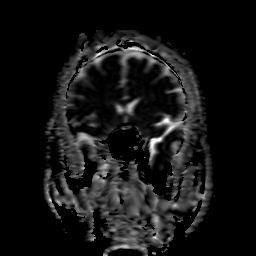
[im 37/37]
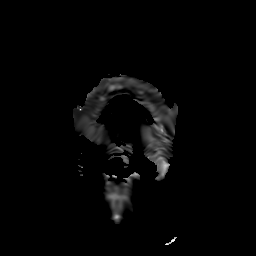

[29 of 48 positions shown; findings below may reference images not displayed]

FINDINGS: Brain: There is restricted diffusion extending from the right
lentiform nucleus to the caudate body with involvement of
intervening white matter. Additional much milder diffusion
hyperintensity with ADC isointensity along the right caudate head.

No evidence of intracranial hemorrhage. There is no intracranial
mass or significant mass effect. There is no hydrocephalus or
extra-axial fluid collection. Ventricles and sulci are normal in
size and configuration. Minimal patchy T2 hyperintensity in the
supratentorial white matter is nonspecific but may reflect minor
chronic microvascular ischemic changes.

Vascular: Major vessel flow voids at the skull base are preserved.

Skull and upper cervical spine: Normal marrow signal is preserved.

Sinuses/Orbits: Mild polypoid mucosal thickening. Orbits are
unremarkable.

Other: Sella is unremarkable.  Mastoid air cells are clear.
IMPRESSION: Acute infarction involving the right basal ganglia and adjacent
white matter. More subacute infarct of the right caudate head. No
hemorrhage.

## 2021-01-13 ENCOUNTER — Ambulatory Visit (INDEPENDENT_AMBULATORY_CARE_PROVIDER_SITE_OTHER): Payer: BLUE CROSS/BLUE SHIELD | Admitting: Internal Medicine

## 2021-01-13 ENCOUNTER — Encounter: Payer: Self-pay | Admitting: Internal Medicine

## 2021-01-13 VITALS — BP 136/74 | HR 84 | Temp 98.1°F | Ht 74.0 in | Wt 197.7 lb

## 2021-01-13 DIAGNOSIS — F17200 Nicotine dependence, unspecified, uncomplicated: Secondary | ICD-10-CM

## 2021-01-13 DIAGNOSIS — I10 Essential (primary) hypertension: Secondary | ICD-10-CM | POA: Diagnosis not present

## 2021-01-13 DIAGNOSIS — E119 Type 2 diabetes mellitus without complications: Secondary | ICD-10-CM | POA: Diagnosis not present

## 2021-01-13 DIAGNOSIS — N1832 Chronic kidney disease, stage 3b: Secondary | ICD-10-CM

## 2021-01-13 DIAGNOSIS — I6300 Cerebral infarction due to thrombosis of unspecified precerebral artery: Secondary | ICD-10-CM

## 2021-01-13 LAB — NOVEL CORONAVIRUS, NAA: SARS-CoV-2, NAA: DETECTED — AB

## 2021-01-13 MED ORDER — METFORMIN HCL 1000 MG PO TABS: 1000.0000 mg | ORAL_TABLET | Freq: Every day | ORAL | 11 refills | Status: DC

## 2021-01-13 MED ORDER — AMLODIPINE BESYLATE 10 MG PO TABS: 10.0000 mg | ORAL_TABLET | Freq: Every day | ORAL | 3 refills | Status: DC

## 2021-01-13 MED ORDER — LOSARTAN POTASSIUM-HCTZ 100-25 MG PO TABS: 1.0000 | ORAL_TABLET | Freq: Every day | ORAL | 3 refills | Status: DC

## 2021-01-13 MED ORDER — DAPAGLIFLOZIN PROPANEDIOL 5 MG PO TABS
5.0000 mg | ORAL_TABLET | Freq: Every day | ORAL | 3 refills | Status: DC
Start: 2021-01-13 — End: 2021-08-05

## 2021-01-13 MED ORDER — ASPIRIN 81 MG PO TBEC: 81.0000 mg | DELAYED_RELEASE_TABLET | Freq: Every day | ORAL | 11 refills | Status: DC

## 2021-01-13 MED ORDER — CARVEDILOL 12.5 MG PO TABS
12.5000 mg | ORAL_TABLET | Freq: Two times a day (BID) | ORAL | 2 refills | Status: DC
Start: 2021-01-13 — End: 2021-07-20

## 2021-01-13 MED ORDER — ATORVASTATIN CALCIUM 80 MG PO TABS: 80.0000 mg | ORAL_TABLET | Freq: Every day | ORAL | 3 refills | Status: DC

## 2021-01-13 NOTE — Progress Notes (Signed)
CC: CVA  HPI:  [name] is a 45 y.o. male with a past medical history stated below and presents today for CVA. Please see problem based assessment and plan for additional details.  Past Medical History:  Diagnosis Date  . Bipolar disorder (HCC)    Dr. Georgia Lopes with [facility] Psychiatry  . Chronic back pain   . Hypertension 2017  . Schizophrenia (HCC)     Current Outpatient Medications on File Prior to Visit  Medication Sig Dispense Refill  . haloperidol (HALDOL) 5 MG tablet Take 1 tablet (5 mg total) by mouth at bedtime. 30 tablet 0  . [DISCONTINUED] Azilsartan-Chlorthalidone 40-12.5 MG TABS Take 1 tablet by mouth daily. (Patient not taking: Reported on 04/09/2019) 30 tablet 1  . [DISCONTINUED] glyBURIDE-metformin (GLUCOVANCE) 2.5-500 MG tablet Take 1 tablet by mouth 2 (two) times daily with a meal. 180 tablet 1   No current facility-administered medications on file prior to visit.    Family History  Problem Relation Age of Onset  . Hypertension Mother   . COPD Father   . Asthma Sister   . Heart disease Neg Hx   . Stroke Neg Hx   . Diabetes Neg Hx     Social History   Socioeconomic History  . Marital status: Single    Spouse name: Not on file  . Number of children: Not on file  . Years of education: Not on file  . Highest education level: Not on file  Occupational History  . Not on file  Tobacco Use  . Smoking status: Current Every Day Smoker    Packs/day: 0.50    Years: 6.00    Pack years: 3.00    Types: Cigarettes  . Smokeless tobacco: Never Used  Vaping Use  . Vaping Use: Never used  Substance and Sexual Activity  . Alcohol use: No  . Drug use: No  . Sexual activity: Yes    Birth control/protection: Condom  Other Topics Concern  . Not on file  Social History Narrative   Lives with mother and sister.   In workers comp coverage currently.   10/2018.   Social Determinants of Health   Financial Resource Strain: Not on file  Food Insecurity:  Not on file  Transportation Needs: Not on file  Physical Activity: Not on file  Stress: Not on file  Social Connections: Not on file  Intimate Partner Violence: Not on file    Review of Systems: ROS negative except for what is noted on the assessment and plan.  Vitals:   01/13/21 1603  BP: 136/74  Pulse: 84  Temp: 98.1 F (36.7 C)  TempSrc: Oral  Weight: 197 lb 11.2 oz (89.7 kg)  Height: 6\' 2"  (1.88 m)     Physical Exam: Physical Exam Constitutional:      Appearance: Normal appearance.  HENT:     Head: Normocephalic and atraumatic.  Eyes:     General: No visual field deficit.    Extraocular Movements: Extraocular movements intact.     Pupils: Pupils are equal, round, and reactive to light.  Cardiovascular:     Rate and Rhythm: Regular rhythm.     Pulses: Normal pulses.     Heart sounds: Normal heart sounds.  Pulmonary:     Effort: Pulmonary effort is normal.     Breath sounds: Normal breath sounds.  Musculoskeletal:        General: No swelling.     Cervical back: Normal range of motion. No tenderness.  Right lower leg: No edema.     Left lower leg: No edema.  Skin:    General: Skin is warm and dry.  Neurological:     Mental Status: He is alert.     Cranial Nerves: No cranial nerve deficit, dysarthria or facial asymmetry.     Sensory: Sensation is intact. No sensory deficit.     Motor: Motor function is intact. No abnormal muscle tone.     Coordination: Coordination abnormal.     Deep Tendon Reflexes: Reflexes normal.     Comments: Ataxia of his left hand with weakness at wrist. 4/5 left grip strength and 5/5 left arm strength compared te right arm.      Assessment & Plan:   See Encounters Tab for problem based charting.  Patient discussed with Dr. Jaynie Crumble, D.O. [facility], PGY-2 Pager: [phone], Phone: [phone] Date 01/14/2021 Time 1:11 PM

## 2021-01-13 NOTE — Patient Instructions (Addendum)
Thank you, [name] for allowing [place] to provide your care today. Today we discussed Stroke with left hand weakness.    I have ordered the following labs for you:  Lab Orders  No laboratory test(s) ordered today     Tests ordered today:  none  Referrals ordered today:    Referral Orders     Ambulatory referral to Physical Therapy   I have ordered the following medication/changed the following medications:   Stop the following medications: None  Start the following medications: Meds ordered this encounter  Medications  . losartan-hydrochlorothiazide (HYZAAR) 100-25 MG tablet    Sig: Take 1 tablet by mouth daily.    Dispense:  30 tablet    Refill:  3  . metFORMIN (GLUCOPHAGE) 1000 MG tablet    Sig: Take 1 tablet (1,000 mg total) by mouth daily with breakfast.    Dispense:  30 tablet    Refill:  11  . amLODipine (NORVASC) 10 MG tablet    Sig: Take 1 tablet (10 mg total) by mouth daily.    Dispense:  60 tablet    Refill:  3  . carvedilol (COREG) 12.5 MG tablet    Sig: Take 1 tablet (12.5 mg total) by mouth 2 (two) times daily with a meal.    Dispense:  60 tablet    Refill:  2  . dapagliflozin propanediol (FARXIGA) 5 MG TABS tablet    Sig: Take 1 tablet (5 mg total) by mouth daily before breakfast.    Dispense:  30 tablet    Refill:  3  . atorvastatin (LIPITOR) 80 MG tablet    Sig: Take 1 tablet (80 mg total) by mouth daily.    Dispense:  60 tablet    Refill:  3  . aspirin 81 MG EC tablet    Sig: Take 1 tablet (81 mg total) by mouth daily. Swallow whole.    Dispense:  30 tablet    Refill:  11     Follow up: 3 months    Remember: to follow up with your Neurologist and nephrologist ([facility])  Should you have any questions or concerns please call the internal medicine clinic at [phone].     Dellia Cloud, D.O. [facility]

## 2021-01-14 ENCOUNTER — Telehealth: Payer: Self-pay | Admitting: Family

## 2021-01-14 ENCOUNTER — Telehealth: Payer: Self-pay | Admitting: *Deleted

## 2021-01-14 ENCOUNTER — Other Ambulatory Visit: Payer: Self-pay | Admitting: Internal Medicine

## 2021-01-14 ENCOUNTER — Encounter: Payer: Self-pay | Admitting: Internal Medicine

## 2021-01-14 ENCOUNTER — Other Ambulatory Visit: Payer: Self-pay | Admitting: Family

## 2021-01-14 DIAGNOSIS — U071 COVID-19: Secondary | ICD-10-CM

## 2021-01-14 DIAGNOSIS — I10 Essential (primary) hypertension: Secondary | ICD-10-CM

## 2021-01-14 NOTE — Assessment & Plan Note (Signed)
Patient has history of chronic kidney disease with concern for diabetic nephropathy with significant proteinuria.  This has been worked up in the hospital.  Dr. Pearlean Brownie plans to continue work-up at his follow-up appointment with neurology in a couple weeks.  Patient also has a nephrology appointment tomorrow.  Therefore, I will hold off on further lab evaluation to prevent him from redundant work-up.  Plan: -Continue losartan 100 mg daily, refilled today -Patient was reminded of his nephrology follow-up tomorrow and his neurology follow-up in the coming weeks.

## 2021-01-14 NOTE — Telephone Encounter (Signed)
I connected by phone with Rodney Rose on 01/14/2021 at 3:19 PM to discuss the potential use of a new treatment for mild to moderate COVID-19 viral infection in non-hospitalized patients.  This patient is a 45 y.o. male that meets the FDA criteria for Emergency Use Authorization of COVID monoclonal antibody sotrovimab.  Has a (+) direct SARS-CoV-2 viral test result  Has mild or moderate COVID-19   Is NOT hospitalized due to COVID-19  Is within 10 days of symptom onset  Has at least one of the high risk factor(s) for progression to severe COVID-19 and/or hospitalization as defined in EUA.  Specific high risk criteria : BMI > 25, Chronic Kidney Disease (CKD) and Cardiovascular disease or hypertension   I have spoken and communicated the following to the patient or parent/caregiver regarding COVID monoclonal antibody treatment:  1. FDA has authorized the emergency use for the treatment of mild to moderate COVID-19 in adults and pediatric patients with positive results of direct SARS-CoV-2 viral testing who are 48 years of age and older weighing at least 40 kg, and who are at high risk for progressing to severe COVID-19 and/or hospitalization.  2. The significant known and potential risks and benefits of COVID monoclonal antibody, and the extent to which such potential risks and benefits are unknown.  3. Information on available alternative treatments and the risks and benefits of those alternatives, including clinical trials.  4. Patients treated with COVID monoclonal antibody should continue to self-isolate and use infection control measures (e.g., wear mask, isolate, social distance, avoid sharing personal items, clean and disinfect "high touch" surfaces, and frequent handwashing) according to CDC guidelines.   5. The patient or parent/caregiver has the option to accept or refuse COVID monoclonal antibody treatment.  After reviewing this information with the patient, the patient has  agreed to receive one of the available covid 19 monoclonal antibodies and will be provided an appropriate fact sheet prior to infusion. [name], NP 01/14/2021 3:19 PM

## 2021-01-14 NOTE — Telephone Encounter (Signed)
-----   Message from Dellia Cloud, MD sent at 01/14/2021  1:19 PM EST ----- Regarding: COVID test Hello,   Hey there is there any way we can give [facility] a call regarding this patient's COVID status. It appears as though he just tested positive and did not inform us yesterday.   Thank you,  Dellia Cloud

## 2021-01-14 NOTE — Telephone Encounter (Signed)
Thank you for letting me know

## 2021-01-14 NOTE — Assessment & Plan Note (Signed)
Patient was counseled regarding tobacco cessation.  He states that he smokes about a half a pack daily.  He states that he is not smoking for at least 8 years.  Patient states that he will think about it.  He appears to be in the precontemplative stage of quitting.

## 2021-01-14 NOTE — Progress Notes (Signed)
Internal Medicine Clinic Attending  Case discussed with Dr. Coe  At the time of the visit.  We reviewed the resident's history and exam and pertinent patient test results.  I agree with the assessment, diagnosis, and plan of care documented in the resident's note.  

## 2021-01-14 NOTE — Assessment & Plan Note (Addendum)
Assessment: Patient presents for further evaluation and management of his left hand focal neurologic deficit. 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 There was evidence of subacute infarcts of the right caudate head. At that time his presenting symptoms were hemiparesis, particularly in his left hand. Patient's risk factors for CVA include 8 years smoking history, mixed HLD, HTN on antihypertensive medications, and DM.   Patient states that he is here for reevaluation of his left hand weakness and believes that his focal neurologic deficits stems from a recent accident that occurred at his place of work. On evaluation today, his hand strength appears to be improved from prior notes. He has 4/5 grip strength in his left and and full sensation. He continues to have trouble with ataxia that appears similar to chorea-type motions of his left hand and distal arm. I am concerned that the patient does not have a good understanding of his recent stroke and how it effects him. I counseled him regarding his neuro deficits and highlighted his risk factors as the likely cause of his hand weakness and ataxia. I discussed the importance of minimizing his modifiable risk factors through smoking cessations, HTN/DM control, and taking antiplatelet and statin therapy. Furthermore, I discussed the importance of PT as a means of regaining strength.   Plan: - Continue ASA 81 mg alone  - Continue atorvastatin - Referral for PT - Encouraged follow up with his neurologist and reminded him of his up coming appt.

## 2021-01-14 NOTE — Telephone Encounter (Signed)
Called [facility] per Dr Marchia Bond to make them aware of pt's Coivid + status. Talked to [facility] - stated was stopped at the door this morning; was not seen.

## 2021-01-15 ENCOUNTER — Telehealth: Payer: Self-pay

## 2021-01-15 ENCOUNTER — Other Ambulatory Visit: Payer: Self-pay | Admitting: Student

## 2021-01-15 ENCOUNTER — Ambulatory Visit (HOSPITAL_COMMUNITY)
Admission: RE | Admit: 2021-01-15 | Discharge: 2021-01-15 | Disposition: A | Discharge status: Home / Self Care | Payer: BLUE CROSS/BLUE SHIELD | Source: Ambulatory Visit | Attending: Pulmonary Disease | Admitting: Pulmonary Disease

## 2021-01-15 DIAGNOSIS — U071 COVID-19: Secondary | ICD-10-CM | POA: Diagnosis present

## 2021-01-15 MED ORDER — EPINEPHRINE 0.3 MG/0.3ML IJ SOAJ: 0.3000 mg | Freq: Once | INTRAMUSCULAR | Status: DC | PRN

## 2021-01-15 MED ORDER — FAMOTIDINE IN NACL 20-0.9 MG/50ML-% IV SOLN: 20.0000 mg | Freq: Once | INTRAVENOUS | Status: DC | PRN

## 2021-01-15 MED ORDER — SODIUM CHLORIDE 0.9 % IV SOLN: INTRAVENOUS | Status: DC | PRN

## 2021-01-15 MED ORDER — HYDROCHLOROTHIAZIDE 12.5 MG PO CAPS: 25.0000 mg | ORAL_CAPSULE | Freq: Every day | ORAL | 2 refills | Status: DC

## 2021-01-15 MED ORDER — METHYLPREDNISOLONE SODIUM SUCC 125 MG IJ SOLR: 125.0000 mg | Freq: Once | INTRAMUSCULAR | Status: DC | PRN

## 2021-01-15 MED ORDER — ALBUTEROL SULFATE HFA 108 (90 BASE) MCG/ACT IN AERS: 2.0000 | INHALATION_SPRAY | Freq: Once | RESPIRATORY_TRACT | Status: DC | PRN

## 2021-01-15 MED ORDER — DIPHENHYDRAMINE HCL 50 MG/ML IJ SOLN: 50.0000 mg | Freq: Once | INTRAMUSCULAR | Status: DC | PRN

## 2021-01-15 MED ORDER — SOTROVIMAB 500 MG/8ML IV SOLN
500.0000 mg | Freq: Once | INTRAVENOUS | Status: AC
  Administered 2021-01-15: 500 mg via INTRAVENOUS

## 2021-01-15 MED ORDER — LOSARTAN POTASSIUM 100 MG PO TABS: 100.0000 mg | ORAL_TABLET | Freq: Every day | ORAL | 2 refills | Status: DC

## 2021-01-15 NOTE — Discharge Instructions (Signed)

## 2021-01-15 NOTE — Progress Notes (Signed)
Patient reviewed Fact Sheet for Patients, Parents, and Caregivers for Emergency Use Authorization (EUA) of sotrovimab for the Treatment of Coronavirus. Patient also reviewed and is agreeable to the estimated cost of treatment. Patient is agreeable to proceed.   

## 2021-01-15 NOTE — Progress Notes (Signed)
Diagnosis: COVID-19  Physician: Dr. Patrick Wright  Procedure: Covid Infusion Clinic Med: Sotrovimab infusion - Provided patient with sotrovimab fact sheet for patients, parents, and caregivers prior to infusion.   Complications: No immediate complications noted  Discharge: Discharged home    

## 2021-01-15 NOTE — Telephone Encounter (Signed)
Alright, I just sent in separate prescriptions for losartan and HCTZ.

## 2021-01-15 NOTE — Progress Notes (Signed)
[facility] faxed clinic staff stating that Hyzaar is currently on backorder and they are not sure when they will have some available.  Recommended sending separate medications. Sent prescriptions for Cozaar 100 mg daily and HCTZ 12.5 mg, 2 tablets daily

## 2021-01-15 NOTE — Telephone Encounter (Signed)
Received fax from [facility] that Losartan-HCTZ (Hyzaar)100/25 is on backorder.  TC to [facility] pharmacist spoke with the pharmacist, Sharyl Nimrod, who states they have not been able to get this in a long time and she does not know when they will be able to get it.  Asked if MD can send in 2 separate RX's.  Will forward to red team. Penne Lash, RN,BSN

## 2021-01-27 ENCOUNTER — Ambulatory Visit: Payer: BLUE CROSS/BLUE SHIELD | Admitting: Student

## 2021-02-03 ENCOUNTER — Other Ambulatory Visit: Payer: Self-pay

## 2021-02-03 ENCOUNTER — Encounter: Payer: Self-pay | Admitting: Cardiology

## 2021-02-03 ENCOUNTER — Ambulatory Visit (INDEPENDENT_AMBULATORY_CARE_PROVIDER_SITE_OTHER): Payer: BLUE CROSS/BLUE SHIELD | Admitting: Cardiology

## 2021-02-03 VITALS — BP 142/88 | HR 82 | Ht 74.0 in | Wt 202.8 lb

## 2021-02-03 DIAGNOSIS — I10 Essential (primary) hypertension: Secondary | ICD-10-CM | POA: Diagnosis not present

## 2021-02-03 DIAGNOSIS — I639 Cerebral infarction, unspecified: Secondary | ICD-10-CM | POA: Diagnosis not present

## 2021-02-03 NOTE — Patient Instructions (Signed)
Medication Instructions:  Your physician recommends that you continue on your current medications as directed. Please refer to the Current Medication list given to you today.  *If you need a refill on your cardiac medications before your next appointment, please call your pharmacy*   Lab Work: None ordered.  If you have labs (blood work) drawn today and your tests are completely normal, you will receive your results only by: MyChart Message (if you have MyChart) OR A paper copy in the mail If you have any lab test that is abnormal or we need to change your treatment, we will call you to review the results.   Testing/Procedures: None ordered.    Follow-Up: At [facility], you and your health needs are our priority.  As part of our continuing mission to provide you with exceptional heart care, we have created designated Provider Care Teams.  These Care Teams include your primary Cardiologist (physician) and Advanced Practice Providers (APPs -  Physician Assistants and Nurse Practitioners) who all work together to provide you with the care you need, when you need it.  We recommend signing up for the patient portal called "MyChart".  Sign up information is provided on this After Visit Summary.  MyChart is used to connect with patients for Virtual Visits (Telemedicine).  Patients are able to view lab/test results, encounter notes, upcoming appointments, etc.  Non-urgent messages can be sent to your provider as well.   To learn more about what you can do with MyChart, go to [email or web address].    Your next appointment:   Follow up with Dr Lambert as needed 

## 2021-02-03 NOTE — Progress Notes (Signed)
Electrophysiology Office Note:    Date:  02/03/2021   ID:  Rodney Rose, DOB 1976/12/23, MRN [id number]  PCP:  Steffanie Rainwater, MD  [facility] Cardiologist:  No primary care provider on file.  [facility] Electrophysiologist:  Lanier Prude, MD   Referring MD: No ref. provider found   Chief Complaint: Cryptogenic stroke  History of Present Illness:    Rodney Rose is a 45 y.o. male who presents for an evaluation of cryptogenic stroke at the request of Dr. Antony Contras. Their medical history includes hypertension, diabetes, schizophrenia, bipolar disorder.  He was admitted December 6 through December 05, 2020 with a cryptogenic stroke.  He was discharged with DAPT for 3 weeks and then transition to aspirin monotherapy.  Loop recorder implant was offered at the time of his discharge but he declined and instead wore a 30-day monitor.  There was no atrial fibrillation detected on his Holter.  He presents today to discuss possible loop recorder implant.  Past Medical History:  Diagnosis Date  . Bipolar disorder (HCC)    Dr. Georgia Lopes with [facility]  . Chronic back pain   . Hypertension 2017  . Schizophrenia [facility])     Past Surgical History:  Procedure Laterality Date  . BUBBLE STUDY  12/05/2020   Procedure: BUBBLE STUDY;  Surgeon: Wendall Stade, MD;  Location: [facility] ENDOSCOPY;  Service: Cardiovascular;;  . NO PAST SURGERIES  10/2018  . TEE WITHOUT CARDIOVERSION N/A 12/05/2020   Procedure: TRANSESOPHAGEAL ECHOCARDIOGRAM (TEE);  Surgeon: Wendall Stade, MD;  Location: [facility] ENDOSCOPY;  Service: Cardiovascular;  Laterality: N/A;    Current Medications: Current Meds  Medication Sig  . amLODipine (NORVASC) 10 MG tablet Take 1 tablet (10 mg total) by mouth daily.  [name] aspirin 81 MG EC tablet Take 1 tablet (81 mg total) by mouth daily. Swallow whole.  [name] atorvastatin (LIPITOR) 80 MG tablet Take 1 tablet (80 mg total) by mouth daily.  . carvedilol (COREG) 12.5 MG tablet  Take 1 tablet (12.5 mg total) by mouth 2 (two) times daily with a meal.  . dapagliflozin propanediol (FARXIGA) 5 MG TABS tablet Take 1 tablet (5 mg total) by mouth daily before breakfast.  . hydrochlorothiazide (MICROZIDE) 12.5 MG capsule Take 2 capsules (25 mg total) by mouth daily.  [name] losartan (COZAAR) 100 MG tablet Take 1 tablet (100 mg total) by mouth daily.  . metFORMIN (GLUCOPHAGE) 1000 MG tablet Take 1 tablet (1,000 mg total) by mouth daily with breakfast.     Allergies:   Penicillins   Social History   Socioeconomic History  . Marital status: Single    Spouse name: Not on file  . Number of children: Not on file  . Years of education: Not on file  . Highest education level: Not on file  Occupational History  . Not on file  Tobacco Use  . Smoking status: Current Every Day Smoker    Packs/day: 0.50    Years: 6.00    Pack years: 3.00    Types: Cigarettes  . Smokeless tobacco: Never Used  Vaping Use  . Vaping Use: Never used  Substance and Sexual Activity  . Alcohol use: No  . Drug use: No  . Sexual activity: Yes    Birth control/protection: Condom  Other Topics Concern  . Not on file  Social History Narrative   Lives with mother and sister.   In workers comp coverage currently.   10/2018.   Social Determinants of Health  Financial Resource Strain: Not on file  Food Insecurity: Not on file  Transportation Needs: Not on file  Physical Activity: Not on file  Stress: Not on file  Social Connections: Not on file     Family History: The patient's family history includes Asthma in his sister; COPD in his father; Hypertension in his mother. There is no history of Heart disease, Stroke, or Diabetes.  ROS:   Please see the history of present illness.    All other systems reviewed and are negative.  EKGs/Labs/Other Studies Reviewed:    The following studies were reviewed today:  January 23, 2021 Holter personally reviewed HR 60-139 bpm, average 85bpm. No AF  detected. No sustained arrhythmias detected. 1 episode of NSVT lasting 5 beats. Rare supraventricular and ventricular ectopy.  December 02, 2020 MRI brain IMPRESSION: Acute infarction involving the right basal ganglia and adjacent white matter. More subacute infarct of the right caudate head. No hemorrhage.  EKG:  The ekg ordered today demonstrates sinus rhythm  Recent Labs: 12/02/2020: ALT 19; B Natriuretic Peptide 189.7 12/05/2020: BUN 20; Creatinine, Ser 2.03; Hemoglobin 8.9; Platelets 264; Potassium 3.5; Sodium 146  Recent Lipid Panel    Component Value Date/Time   CHOL 219 (H) 12/03/2020 0111   CHOL 245 (H) 11/02/2018 0925   TRIG 99 12/03/2020 0111   HDL 28 (L) 12/03/2020 0111   HDL 28 (L) 11/02/2018 0925   CHOLHDL 7.8 12/03/2020 0111   VLDL 20 12/03/2020 0111   LDLCALC 171 (H) 12/03/2020 0111   LDLCALC 155 (H) 11/02/2018 0925    Physical Exam:    VS:  BP (!) 142/88   Pulse 82   Ht 6\' 2"  (1.88 m)   Wt 202 lb 12.8 oz (92 kg)   SpO2 98%   BMI 26.04 kg/m     Wt Readings from Last 3 Encounters:  02/03/21 202 lb 12.8 oz (92 kg)  01/13/21 197 lb 11.2 oz (89.7 kg)  12/22/20 202 lb 8 oz (91.9 kg)     GEN:  Well nourished, well developed in no acute distress HEENT: Normal NECK: No JVD; No carotid bruits LYMPHATICS: No lymphadenopathy CARDIAC: RRR, no murmurs, rubs, gallops RESPIRATORY:  Clear to auscultation without rales, wheezing or rhonchi  ABDOMEN: Soft, non-tender, non-distended MUSCULOSKELETAL:  No edema; No deformity  SKIN: Warm and dry NEUROLOGIC:  Alert and oriented x 3 PSYCHIATRIC:  Normal affect   ASSESSMENT:    1. Cerebrovascular accident (CVA), unspecified mechanism (HCC)   2. Primary hypertension    PLAN:    In order of problems listed above:  1. Cryptogenic stroke Patient is a young man with a cryptogenic stroke.  Given the imaging findings, there is concern that an embolic event could have led to the stroke in December.  I discussed using  a loop recorder for ongoing surveillance for atrial fibrillation.  I discussed the procedure, its expected recovery time and how it can help guide therapy.  The patient declined implantable loop recorder during today's visit.  He will let January know if he senses any sort of abnormal rhythms.  If he changes mind in the future, could certainly revisit implanting a loop recorder.  Continue aspirin 81 mg by mouth daily and Lipitor 80 mg by mouth daily.  2.  Hypertension Continue amlodipine, carvedilol, hydrochlorothiazide and losartan.   Medication Adjustments/Labs and Tests Ordered: Current medicines are reviewed at length with the patient today.  Concerns regarding medicines are outlined above.  Orders Placed This Encounter  Procedures  .  EKG 12-Lead   No orders of the defined types were placed in this encounter.    Signed, Steffanie Dunn, MD, [facility]  02/03/2021 3:01 PM    Electrophysiology [facility]

## 2021-02-04 ENCOUNTER — Encounter: Payer: Self-pay | Admitting: Neurology

## 2021-02-04 ENCOUNTER — Ambulatory Visit: Payer: BLUE CROSS/BLUE SHIELD | Admitting: Neurology

## 2021-02-04 VITALS — BP 147/81 | HR 85 | Ht 74.0 in | Wt 201.0 lb

## 2021-02-04 DIAGNOSIS — I639 Cerebral infarction, unspecified: Secondary | ICD-10-CM

## 2021-02-04 DIAGNOSIS — I69354 Hemiplegia and hemiparesis following cerebral infarction affecting left non-dominant side: Secondary | ICD-10-CM

## 2021-02-04 NOTE — Patient Instructions (Signed)
I had a long d/w patient about his recent cryptogenic stroke, risk for recurrent stroke/TIAs, personally independently reviewed imaging studies and stroke evaluation results and answered questions.Continue aspirin 81 mg daily  for secondary stroke prevention and maintain strict control of hypertension with blood pressure goal below 130/90, diabetes with hemoglobin A1c goal below 6.5% and lipids with LDL cholesterol goal below 70 mg/dL. I also advised the patient to eat a healthy diet with plenty of whole grains, cereals, fruits and vegetables, exercise regularly and maintain ideal body weight .I have counseled him to quit smoking completely and seek help from his primary care physician for the same.  Refer to outpatient Occupational Therapy for his left hand weakness.  Repeat anticardiolipin antibodies and kappa/lambda light chains is his initial elevation may have been an acute phase reaction.  Followup in the future with my nurse practitioner Shanda Bumps in 3 months or call earlier if necessary.  Stroke Prevention Some medical conditions and behaviors are associated with a higher chance of having a stroke. You can help prevent a stroke by making nutrition, lifestyle, and other changes, including managing any medical conditions you may have. What nutrition changes can be made?  Eat healthy foods. You can do this by: ? Choosing foods high in fiber, such as fresh fruits and vegetables and whole grains. ? Eating at least 5 or more servings of fruits and vegetables a day. Try to fill half of your plate at each meal with fruits and vegetables. ? Choosing lean protein foods, such as lean cuts of meat, poultry without skin, fish, tofu, beans, and nuts. ? Eating low-fat dairy products. ? Avoiding foods that are high in salt (sodium). This can help lower blood pressure. ? Avoiding foods that have saturated fat, trans fat, and cholesterol. This can help prevent high cholesterol. ? Avoiding processed and premade  foods.  Follow your health care provider's specific guidelines for losing weight, controlling high blood pressure (hypertension), lowering high cholesterol, and managing diabetes. These may include: ? Reducing your daily calorie intake. ? Limiting your daily sodium intake to 1,500 milligrams (mg). ? Using only healthy fats for cooking, such as olive oil, canola oil, or sunflower oil. ? Counting your daily carbohydrate intake.   What lifestyle changes can be made?  Maintain a healthy weight. Talk to your health care provider about your ideal weight.  Get at least 30 minutes of moderate physical activity at least 5 days a week. Moderate activity includes brisk walking, biking, and swimming.  Do not use any products that contain nicotine or tobacco, such as cigarettes and e-cigarettes. If you need help quitting, ask your health care provider. It may also be helpful to avoid exposure to secondhand smoke.  Limit alcohol intake to no more than 1 drink a day for nonpregnant women and 2 drinks a day for men. One drink equals 12 oz of beer, 5 oz of wine, or 1 oz of hard liquor.  Stop any illegal drug use.  Avoid taking birth control pills. Talk to your health care provider about the risks of taking birth control pills if: ? You are over 75 years old. ? You smoke. ? You get migraines. ? You have ever had a blood clot. What other changes can be made?  Manage your cholesterol levels. ? Eating a healthy diet is important for preventing high cholesterol. If cholesterol cannot be managed through diet alone, you may also need to take medicines. ? Take any prescribed medicines to control your cholesterol as told  by your health care provider.  Manage your diabetes. ? Eating a healthy diet and exercising regularly are important parts of managing your blood sugar. If your blood sugar cannot be managed through diet and exercise, you may need to take medicines. ? Take any prescribed medicines to control  your diabetes as told by your health care provider.  Control your hypertension. ? To reduce your risk of stroke, try to keep your blood pressure below 130/80. ? Eating a healthy diet and exercising regularly are an important part of controlling your blood pressure. If your blood pressure cannot be managed through diet and exercise, you may need to take medicines. ? Take any prescribed medicines to control hypertension as told by your health care provider. ? Ask your health care provider if you should monitor your blood pressure at home. ? Have your blood pressure checked every year, even if your blood pressure is normal. Blood pressure increases with age and some medical conditions.  Get evaluated for sleep disorders (sleep apnea). Talk to your health care provider about getting a sleep evaluation if you snore a lot or have excessive sleepiness.  Take over-the-counter and prescription medicines only as told by your health care provider. Aspirin or blood thinners (antiplatelets or anticoagulants) may be recommended to reduce your risk of forming blood clots that can lead to stroke.  Make sure that any other medical conditions you have, such as atrial fibrillation or atherosclerosis, are managed. What are the warning signs of a stroke? The warning signs of a stroke can be easily remembered as BEFAST.  B is for balance. Signs include: ? Dizziness. ? Loss of balance or coordination. ? Sudden trouble walking.  E is for eyes. Signs include: ? A sudden change in vision. ? Trouble seeing.  F is for face. Signs include: ? Sudden weakness or numbness of the face. ? The face or eyelid drooping to one side.  A is for arms. Signs include: ? Sudden weakness or numbness of the arm, usually on one side of the body.  S is for speech. Signs include: ? Trouble speaking (aphasia). ? Trouble understanding.  T is for time. ? These symptoms may represent a serious problem that is an emergency. Do not  wait to see if the symptoms will go away. Get medical help right away. Call your local emergency services (911 in the [place] Do not drive yourself to the hospital.  Other signs of stroke may include: ? A sudden, severe headache with no known cause. ? Nausea or vomiting. ? Seizure. Where to find more information For more information, visit:  American Stroke Association: www.strokeassociation.org  National Stroke Association: www.stroke.org Summary  You can prevent a stroke by eating healthy, exercising, not smoking, limiting alcohol intake, and managing any medical conditions you may have.  Do not use any products that contain nicotine or tobacco, such as cigarettes and e-cigarettes. If you need help quitting, ask your health care provider. It may also be helpful to avoid exposure to secondhand smoke.  Remember BEFAST for warning signs of stroke. Get help right away if you or a loved one has any of these signs. This information is not intended to replace advice given to you by your health care provider. Make sure you discuss any questions you have with your health care provider. Document Revised: 11/25/2017 Document Reviewed: 01/18/2017 Elsevier Patient Education  2021 [employer].

## 2021-02-04 NOTE — Progress Notes (Signed)
[facility] [address] [place]. [place] [address] [phone]       OFFICE FOLLOW-UP NOTE  [name] Date of Birth:  May 16, 1976 Medical Record Number:  [id number]   HPI: [name] is a 45 year old African-American male seen today for initial office follow-up visit following hospital admission for stroke and December 2021.  History is obtained from the patient, review of electronic medical records and I personally reviewed pertinent imaging films in PACS.  He has past medical history of a bipolar disorder, schizophrenia, tobacco abuse and hypertension who presented to [facility] ED on 12/01/2020 with left facial droop, slurred speech, weakness and imbalance since 4 days prior to admission.  He was washing dishes on 11/27/2020 and his arm felt funny and he had some trouble working but he went home symptoms gradually got worse prompting visit to the ED.  CT scan showed chronic infarct but no acute abnormalities but MRI scan showed a large acute right basal ganglia infarct with involving adjacent white matter and subacute right caudate head infarct as well.  His blood pressure significantly elevated in the ED with systolic being greater than 200.  2D echo showed normal ejection fraction without cardiac source of embolism.  Transesophageal echocardiogram was negative for clot or PFO.  TCD bubble study was negative.  Carotid ultrasound showed no significant extracranial stenosis.  MR angiogram of the brain and neck were both normal.  LDL cholesterol is elevated 171 mg percent hemoglobin A1c was 5.5.  Urine drug screen was negative.  Patient at 30-day external cardiac event monitor which was negative for cardiac arrhythmias. [name] ANA was negative sickle cell was negative ESR of 40 mm.  IgM antiphospholipid antibody was elevated at 40 and kappa and lambda light chains were elevated though serum protein electrophoresis did not show any M protein. Patient denies any prior history of  deep vein thrombosis, pulmonary embolism or any family history of blood clots or strokes or heart attacks at a young age.  Patient states that he still has left-sided weakness particular in his hand has grip weakness and diminished fine motor skills.  He also drags his left leg while walking.  He has not had any outpatient physical occupational therapy.  Is tolerating aspirin well without any side effects.  He continues to smoke has not quit. ROS:   14 system review of systems is positive for weakness, gait difficulty all other systems negative  PMH:  Past Medical History:  Diagnosis Date  . Bipolar disorder ([place])    Dr. Heriberto Antigua with [facility]  . Chronic back pain   . Hypertension 2017  . Schizophrenia ([place])   . Stroke [facility]) 11/2020    Social History:  Social History   Socioeconomic History  . Marital status: Single    Spouse name: Not on file  . Number of children: Not on file  . Years of education: Not on file  . Highest education level: Not on file  Occupational History  . Not on file  Tobacco Use  . Smoking status: Current Every Day Smoker    Packs/day: 0.50    Years: 6.00    Pack years: 3.00    Types: Cigarettes  . Smokeless tobacco: Never Used  Vaping Use  . Vaping Use: Never used  Substance and Sexual Activity  . Alcohol use: No  . Drug use: No  . Sexual activity: Yes    Birth control/protection: Condom  Other Topics Concern  . Not on file  Social History Narrative   Lives with mother and sister.     Right Handed   Drinks 2-4 cups caffeine daily   Social Determinants of Health   Financial Resource Strain: Not on file  Food Insecurity: Not on file  Transportation Needs: Not on file  Physical Activity: Not on file  Stress: Not on file  Social Connections: Not on file  Intimate Partner Violence: Not on file    Medications:   Current Outpatient Medications on File Prior to Visit  Medication Sig Dispense Refill  . amLODipine (NORVASC) 10 MG  tablet Take 1 tablet (10 mg total) by mouth daily. 60 tablet 3  . aspirin 81 MG EC tablet Take 1 tablet (81 mg total) by mouth daily. Swallow whole. 30 tablet 11  . atorvastatin (LIPITOR) 80 MG tablet Take 1 tablet (80 mg total) by mouth daily. 60 tablet 3  . carvedilol (COREG) 12.5 MG tablet Take 1 tablet (12.5 mg total) by mouth 2 (two) times daily with a meal. 60 tablet 2  . dapagliflozin propanediol (FARXIGA) 5 MG TABS tablet Take 1 tablet (5 mg total) by mouth daily before breakfast. 30 tablet 3  . hydrochlorothiazide (MICROZIDE) 12.5 MG capsule Take 2 capsules (25 mg total) by mouth daily. 60 capsule 2  . losartan (COZAAR) 100 MG tablet Take 1 tablet (100 mg total) by mouth daily. 30 tablet 2  . metFORMIN (GLUCOPHAGE) 1000 MG tablet Take 1 tablet (1,000 mg total) by mouth daily with breakfast. 30 tablet 11  . haloperidol (HALDOL) 5 MG tablet Take 1 tablet (5 mg total) by mouth at bedtime. 30 tablet 0  . [DISCONTINUED] Azilsartan-Chlorthalidone 40-12.5 MG TABS Take 1 tablet by mouth daily. (Patient not taking: Reported on 04/09/2019) 30 tablet 1  . [DISCONTINUED] glyBURIDE-metformin (GLUCOVANCE) 2.5-500 MG tablet Take 1 tablet by mouth 2 (two) times daily with a meal. 180 tablet 1   No current facility-administered medications on file prior to visit.    Allergies:   Allergies  Allergen Reactions  . Penicillins Swelling    Physical Exam General: well developed, well nourished middle-aged African-American male, seated, in no evident distress Head: head normocephalic and atraumatic.  Neck: supple with no carotid or supraclavicular bruits Cardiovascular: regular rate and rhythm, no murmurs Musculoskeletal: no deformity Skin:  no rash/petichiae Vascular:  Normal pulses all extremities Vitals:   02/04/21 0831  BP: (!) 147/81  Pulse: 85   Neurologic Exam Mental Status: Awake and fully alert. Oriented to place and time. Recent and remote memory intact. Attention span, concentration and  fund of knowledge appropriate. Mood and affect appropriate.  Cranial Nerves: Fundoscopic exam reveals sharp disc margins. Pupils equal, briskly reactive to light. Extraocular movements full without nystagmus. Visual fields full to confrontation. Hearing intact. Facial sensation intact.  Mild left face weakness, tongue, palate moves normally and symmetrically.  Motor: Mild left hemiparesis with weakness of left grip intrinsic hand muscles and orbits right over left upper extremity.  Mild weakness of left ankle dorsiflexors. Sensory.: intact to touch ,pinprick .position and vibratory sensation.  Coordination: Rapid alternating movements normal in all extremities. Finger-to-nose and heel-to-shin performed accurately bilaterally. Gait and Station: Arises from chair without difficulty. Stance is normal. Gait demonstrates normal stride length and balance but with slight dragging of the left leg.. Able to heel, toe and tandem walk with moderate difficulty.  Reflexes: 1+ and symmetric. Toes downgoing.   NIHSS  3 Modified Rankin  2   ASSESSMENT: 45 year old African-American male with large right basal  ganglia infarct of cryptogenic etiology in December 2021.  Vascular risk factors of diabetes, hypertension, smoking and hyperlipidemia.  Is doing well with only minimal left hemiparesis     PLAN: I had a long d/w patient about his recent cryptogenic stroke, risk for recurrent stroke/TIAs, personally independently reviewed imaging studies and stroke evaluation results and answered questions.Continue aspirin 81 mg daily  for secondary stroke prevention and maintain strict control of hypertension with blood pressure goal below 130/90, diabetes with hemoglobin A1c goal below 6.5% and lipids with LDL cholesterol goal below 70 mg/dL. I also advised the patient to eat a healthy diet with plenty of whole grains, cereals, fruits and vegetables, exercise regularly and maintain ideal body weight .I have counseled him to  quit smoking completely and seek help from his primary care physician for the same.  Refer to outpatient Occupational Therapy for his left hand weakness.  Repeat anticardiolipin antibodies and kappa/lambda light chains is his initial elevation may have been an acute phase reaction.  Followup in the future with my nurse practitioner Janett Billow in 3 months or call earlier if necessary. Greater than 50% of time during this 30 minute visit was spent on counseling,explanation of diagnosis, planning of further management, discussion with patient and family and coordination of care Antony Contras, MD  [facility] [address] [place], [place] [address]  Phone [phone] Fax [phone] Note: This document was prepared with digital dictation and possible smart phrase technology. Any transcriptional errors that result from this process are unintentional

## 2021-02-05 LAB — CARDIOLIPIN ANTIBODIES, IGG, IGM, IGA
Anticardiolipin IgA: <9 APL U/mL (ref 0–11)
Anticardiolipin IgG: <9 GPL U/mL (ref 0–14)
Anticardiolipin IgM: 44 MPL U/mL — ABNORMAL HIGH (ref 0–12)

## 2021-02-05 LAB — KAPPA/LAMBDA LIGHT CHAINS
Ig Kappa Free Light Chain: 72.6 mg/L — ABNORMAL HIGH (ref 3.3–19.4)
Ig Lambda Free Light Chain: 28.4 mg/L — ABNORMAL HIGH (ref 5.7–26.3)
KAPPA/LAMBDA RATIO: 2.56 — ABNORMAL HIGH (ref 0.26–1.65)

## 2021-02-10 ENCOUNTER — Other Ambulatory Visit: Payer: Self-pay | Admitting: Neurology

## 2021-02-10 DIAGNOSIS — R760 Raised antibody titer: Secondary | ICD-10-CM

## 2021-02-10 NOTE — Progress Notes (Signed)
Kindly inform the patient that lab work for abnormal proteins anticardiolipin antibodies remains abnormal I may need to refer him to hematologist for further advice if he is willing.

## 2021-02-11 ENCOUNTER — Ambulatory Visit: Payer: BLUE CROSS/BLUE SHIELD | Admitting: Speech Pathology

## 2021-02-12 ENCOUNTER — Telehealth: Payer: Self-pay | Admitting: *Deleted

## 2021-02-12 NOTE — Telephone Encounter (Signed)
Per referral 02/10/21 Dr. Pearlean Brownie - called and lvm of upcoming apts - mailed calendar

## 2021-02-18 ENCOUNTER — Telehealth: Payer: Self-pay | Admitting: *Deleted

## 2021-02-18 NOTE — Telephone Encounter (Signed)
I returned the call to the patient. He has spoken to the referring office and understands that they will try to get him in for an earlier appt. He also wanted to know what hematology would do concerning the labs. I informed him that he will need to see the MD for his initial evaluation first and they will instruct him on next steps.

## 2021-02-18 NOTE — Telephone Encounter (Signed)
Left a message for a return call. He may speak to [place] or myself.

## 2021-02-18 NOTE — Telephone Encounter (Signed)
Patient is aware of details . And referring office has talked to him . Office is working on sooner apt.

## 2021-02-18 NOTE — Telephone Encounter (Signed)
-----   Message from Micki Riley, MD sent at 02/10/2021  4:06 PM EST ----- Kindly inform the patient that lab work for abnormal proteins anticardiolipin antibodies remains abnormal I may need to refer him to hematologist for further advice if he is willing.

## 2021-02-18 NOTE — Telephone Encounter (Signed)
Pt is asking for a call back from [facility]

## 2021-02-18 NOTE — Telephone Encounter (Signed)
I spoke to the patient and he is aware of the lab results. He is agreeable to the referral recommended by Dr. Pearlean Brownie. He is aware to expect a call.

## 2021-03-03 ENCOUNTER — Other Ambulatory Visit: Payer: Self-pay

## 2021-03-03 ENCOUNTER — Inpatient Hospital Stay: Payer: BLUE CROSS/BLUE SHIELD | Attending: Family

## 2021-03-03 ENCOUNTER — Encounter: Payer: Self-pay | Admitting: Family

## 2021-03-03 ENCOUNTER — Other Ambulatory Visit: Payer: Self-pay | Admitting: Family

## 2021-03-03 ENCOUNTER — Inpatient Hospital Stay: Payer: BLUE CROSS/BLUE SHIELD | Admitting: Family

## 2021-03-03 VITALS — BP 148/84 | HR 78 | Temp 98.7°F | Resp 18 | Ht 74.0 in | Wt 199.4 lb

## 2021-03-03 DIAGNOSIS — Z8673 Personal history of transient ischemic attack (TIA), and cerebral infarction without residual deficits: Secondary | ICD-10-CM

## 2021-03-03 DIAGNOSIS — Z880 Allergy status to penicillin: Secondary | ICD-10-CM

## 2021-03-03 DIAGNOSIS — Z836 Family history of other diseases of the respiratory system: Secondary | ICD-10-CM | POA: Insufficient documentation

## 2021-03-03 DIAGNOSIS — R202 Paresthesia of skin: Secondary | ICD-10-CM

## 2021-03-03 DIAGNOSIS — F17210 Nicotine dependence, cigarettes, uncomplicated: Secondary | ICD-10-CM

## 2021-03-03 DIAGNOSIS — I639 Cerebral infarction, unspecified: Secondary | ICD-10-CM

## 2021-03-03 DIAGNOSIS — D6861 Antiphospholipid syndrome: Secondary | ICD-10-CM | POA: Diagnosis present

## 2021-03-03 DIAGNOSIS — R531 Weakness: Secondary | ICD-10-CM | POA: Insufficient documentation

## 2021-03-03 DIAGNOSIS — Z803 Family history of malignant neoplasm of breast: Secondary | ICD-10-CM | POA: Insufficient documentation

## 2021-03-03 DIAGNOSIS — Z7984 Long term (current) use of oral hypoglycemic drugs: Secondary | ICD-10-CM | POA: Diagnosis not present

## 2021-03-03 DIAGNOSIS — Z8249 Family history of ischemic heart disease and other diseases of the circulatory system: Secondary | ICD-10-CM | POA: Insufficient documentation

## 2021-03-03 DIAGNOSIS — Z7982 Long term (current) use of aspirin: Secondary | ICD-10-CM

## 2021-03-03 DIAGNOSIS — Z79899 Other long term (current) drug therapy: Secondary | ICD-10-CM | POA: Diagnosis not present

## 2021-03-03 DIAGNOSIS — R760 Raised antibody titer: Secondary | ICD-10-CM

## 2021-03-03 DIAGNOSIS — R200 Anesthesia of skin: Secondary | ICD-10-CM | POA: Diagnosis not present

## 2021-03-03 DIAGNOSIS — I10 Essential (primary) hypertension: Secondary | ICD-10-CM | POA: Insufficient documentation

## 2021-03-03 DIAGNOSIS — E119 Type 2 diabetes mellitus without complications: Secondary | ICD-10-CM | POA: Diagnosis not present

## 2021-03-03 DIAGNOSIS — Z809 Family history of malignant neoplasm, unspecified: Secondary | ICD-10-CM | POA: Diagnosis not present

## 2021-03-03 LAB — CBC WITH DIFFERENTIAL (CANCER CENTER ONLY)
Abs Immature Granulocytes: 0.03 10*3/uL (ref 0.00–0.07)
Basophils Absolute: 0.1 10*3/uL (ref 0.0–0.1)
Basophils Relative: 1 %
Eosinophils Absolute: 0.5 10*3/uL (ref 0.0–0.5)
Eosinophils Relative: 5 %
HCT: 39.3 % (ref 39.0–52.0)
Hemoglobin: 12.8 g/dL — ABNORMAL LOW (ref 13.0–17.0)
Immature Granulocytes: 0 %
Lymphocytes Relative: 25 %
Lymphs Abs: 2.7 10*3/uL (ref 0.7–4.0)
MCH: 25.4 pg — ABNORMAL LOW (ref 26.0–34.0)
MCHC: 32.6 g/dL (ref 30.0–36.0)
MCV: 78 fL — ABNORMAL LOW (ref 80.0–100.0)
Monocytes Absolute: 0.6 10*3/uL (ref 0.1–1.0)
Monocytes Relative: 5 %
Neutro Abs: 6.8 10*3/uL (ref 1.7–7.7)
Neutrophils Relative %: 64 %
Platelet Count: 290 10*3/uL (ref 150–400)
RBC: 5.04 MIL/uL (ref 4.22–5.81)
RDW: 14.7 % (ref 11.5–15.5)
WBC Count: 10.7 10*3/uL — ABNORMAL HIGH (ref 4.0–10.5)
nRBC: 0 % (ref 0.0–0.2)

## 2021-03-03 LAB — CMP (CANCER CENTER ONLY)
ALT: 16 U/L (ref 0–44)
AST: 13 U/L — ABNORMAL LOW (ref 15–41)
Albumin: 4.2 g/dL (ref 3.5–5.0)
Alkaline Phosphatase: 84 U/L (ref 38–126)
Anion gap: 5 (ref 5–15)
BUN: 24 mg/dL — ABNORMAL HIGH (ref 6–20)
CO2: 30 mmol/L (ref 22–32)
Calcium: 10.1 mg/dL (ref 8.9–10.3)
Chloride: 105 mmol/L (ref 98–111)
Creatinine: 1.77 mg/dL — ABNORMAL HIGH (ref 0.61–1.24)
GFR, Estimated: 48 mL/min — ABNORMAL LOW (ref >60)
Glucose, Bld: 151 mg/dL — ABNORMAL HIGH (ref 70–99)
Potassium: 3.4 mmol/L — ABNORMAL LOW (ref 3.5–5.1)
Sodium: 140 mmol/L (ref 135–145)
Total Bilirubin: 0.3 mg/dL (ref 0.3–1.2)
Total Protein: 6.9 g/dL (ref 6.5–8.1)

## 2021-03-03 LAB — LACTATE DEHYDROGENASE: LDH: 185 U/L (ref 98–192)

## 2021-03-03 LAB — SAVE SMEAR(SSMR), FOR PROVIDER SLIDE REVIEW

## 2021-03-03 NOTE — Progress Notes (Signed)
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 Pearlean Brownie.  He is currently on one baby aspirin daily.  He denies any issue with bleeding. No abnormal bruising, no petechiae.  He has no prior history of stroke.  No personal or familial history of thrombus.  He had an elevated anticardiolipin IgM level at 44 in February 2022. Light chins were slightly elevated. No SPEP detected in December 2021.  ECHO in December showed an EF of 50-55%.  He does smoke 1/2 ppd. No ETOH or recreational drug use.  He is diabetic and states that his blood sugars are well controlled on Metformin.  No history of thyroid disease.  Mother has history of breast cancer and maternal grandmother had an unknown primary.  He has never had a colonoscopy.  No fever, chills, n/v, cough, rash, dizziness, SOB, chest pain, palpitations, abdominal pain or changes in bowel or bladder habits.  No swelling or tenderness in her extremities.  He has numbness and tingling in the right fingertips and still has a mild right sided weakness/deficit since the CVA.  He is ambulating independently and denies any falls or syncope.  He has maintained a good appetite and is staying well hydrated. His weight is stable at 199 lbs.  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 He is currently on leave while he recuperates.   ROS: All other 10 point review of systems is negative.    PAST MEDICAL HISTORY:   Past Medical History:  Diagnosis Date   Bipolar disorder (HCC)    Dr. Georgia Lopes with [facility]   Chronic back pain    Hypertension 2017   Schizophrenia [facility])    Stroke (HCC) 11/2020    ALLERGIES: Allergies  Allergen Reactions   Penicillins Swelling      MEDICATIONS:  Current Outpatient Medications on File Prior to Visit  Medication Sig Dispense Refill   amLODipine (NORVASC) 10 MG tablet Take 1 tablet (10 mg total) by mouth daily. 60 tablet 3   aspirin 81 MG EC tablet Take 1 tablet (81 mg total) by mouth daily. Swallow whole. 30 tablet 11   atorvastatin (LIPITOR) 80 MG tablet Take 1 tablet (80 mg total) by mouth daily. 60 tablet 3   carvedilol (COREG) 12.5 MG tablet Take 1 tablet (12.5 mg total) by mouth 2 (two) times daily with a meal. 60 tablet 2   dapagliflozin propanediol (FARXIGA) 5 MG TABS tablet Take 1 tablet (5 mg total) by mouth daily before breakfast. 30 tablet 3   haloperidol (HALDOL) 5 MG tablet Take 1 tablet (5 mg total) by mouth at bedtime. 30 tablet 0   hydrochlorothiazide (MICROZIDE) 12.5 MG capsule Take 2 capsules (25 mg total) by mouth daily. 60 capsule 2   losartan (COZAAR) 100 MG tablet Take 1 tablet (100 mg total) by mouth daily. 30 tablet 2   metFORMIN (GLUCOPHAGE) 1000 MG tablet Take 1 tablet (1,000 mg total) by mouth  daily with breakfast. 30 tablet 11   [DISCONTINUED] Azilsartan-Chlorthalidone 40-12.5 MG TABS Take 1 tablet by mouth daily. (Patient not taking: Reported on 04/09/2019) 30 tablet 1   [DISCONTINUED] glyBURIDE-metformin (GLUCOVANCE) 2.5-500 MG tablet Take 1 tablet by mouth 2 (two) times daily with a meal. 180 tablet 1   No current facility-administered medications on file prior to visit.     PAST SURGICAL HISTORY Past Surgical History:  Procedure Laterality Date   BUBBLE STUDY  12/05/2020   Procedure: BUBBLE STUDY;  Surgeon: Wendall Stade, MD;  Location: [facility] ENDOSCOPY;  Service:  Cardiovascular;;   NO PAST SURGERIES  10/2018   TEE WITHOUT CARDIOVERSION N/A 12/05/2020   Procedure: TRANSESOPHAGEAL ECHOCARDIOGRAM (TEE);  Surgeon: Wendall Stade, MD;  Location: [facility] ENDOSCOPY;  Service: Cardiovascular;  Laterality: N/A;    FAMILY HISTORY: Family History  Problem Relation Age of Onset   Hypertension Mother    COPD Father    Asthma Sister    Heart disease Neg Hx    Stroke Neg Hx    Diabetes Neg Hx     SOCIAL HISTORY:  reports that he has been smoking cigarettes. He has a 3.00 pack-year smoking history. He has never used smokeless tobacco. He reports that he does not drink alcohol and does not use drugs.  PERFORMANCE STATUS: The patient's performance status is 1 - Symptomatic but completely ambulatory  PHYSICAL EXAM: Most Recent Vital Signs: There were no vitals taken for this visit. BP (!) 148/84 (BP Location: Left Arm, Patient Position: Sitting)    Pulse 78    Temp 98.7 F (37.1 C) (Oral)    Resp 18    Ht 6\' 2"  (1.88 m)    Wt 199 lb 6.4 oz (90.4 kg)    SpO2 100%    BMI 25.60 kg/m   General Appearance:    Alert, cooperative, no distress, appears stated age  Head:    Normocephalic, without obvious abnormality, atraumatic  Eyes:    PERRL, conjunctiva/corneas clear, EOM's intact, fundi    benign, both eyes             Throat:   Lips, mucosa, and tongue normal; teeth and gums normal  Neck:   Supple, symmetrical, trachea midline, no adenopathy;       thyroid:  No enlargement/tenderness/nodules; no carotid   bruit or JVD  Back:     Symmetric, no curvature, ROM normal, no CVA tenderness  Lungs:     Clear to auscultation bilaterally, respirations unlabored  Chest wall:    No tenderness or deformity  Heart:    Regular rate and rhythm, S1 and S2 normal, no murmur, rub   or gallop  Abdomen:     Soft, non-tender, bowel sounds active all four quadrants,    no masses, no organomegaly        Extremities:   Extremities normal, atraumatic, no cyanosis or edema   Pulses:   2+ and symmetric all extremities  Skin:   Skin color, texture, turgor normal, no rashes or lesions  Lymph nodes:   Cervical, supraclavicular, and axillary nodes normal  Neurologic:   CNII-XII intact. Normal strength, sensation and reflexes      throughout    LABORATORY DATA:  Results for orders placed or performed in visit on 03/03/21 (from the past 48 hour(s))  CBC with Differential (Cancer Center Only)     Status: Abnormal   Collection Time: 03/03/21  2:54 PM  Result Value Ref Range   WBC Count 10.7 (H)  4.0 - 10.5 K/uL   RBC 5.04 4.22 - 5.81 MIL/uL   Hemoglobin 12.8 (L) 13.0 - 17.0 g/dL   HCT 78.2 95.6 - 21.3 %   MCV 78.0 (L) 80.0 - 100.0 fL   MCH 25.4 (L) 26.0 - 34.0 pg   MCHC 32.6 30.0 - 36.0 g/dL   RDW 08.6 57.8 - 46.9 %   Platelet Count 290 150 - 400 K/uL   nRBC 0.0 0.0 - 0.2 %   Neutrophils Relative % 64 %   Neutro Abs 6.8 1.7 - 7.7 K/uL   Lymphocytes Relative 25 %   Lymphs Abs 2.7 0.7 - 4.0 K/uL   Monocytes Relative 5 %   Monocytes Absolute 0.6 0.1 - 1.0 K/uL   Eosinophils Relative 5 %   Eosinophils Absolute 0.5 0.0 - 0.5 K/uL   Basophils Relative 1 %   Basophils Absolute 0.1 0.0 - 0.1 K/uL   Immature Granulocytes 0 %   Abs Immature Granulocytes 0.03 0.00 - 0.07 K/uL    Comment: Performed at [facility] at [facility], [address] [place], [place] [address]  Save Smear [facility])     Status: None   Collection Time: 03/03/21  2:54 PM  Result Value Ref Range   Smear Review SMEAR STAINED AND AVAILABLE FOR REVIEW     Comment: Performed at [facility] at [facility], [address] [place], [place] [address]      RADIOGRAPHY: No results found.     PATHOLOGY: None   ASSESSMENT/PLAN: Mr. Grabel is a very pleasant 45 yo [place] American gentleman with history of CVA in December 2021 and positive anticardiolipin antibodies on recent lab work with Neurology Dr. Pearlean Brownie.  I went over his lab work in  detail with Dr. Myna Hidalgo. Anticardiolipin IgM level has improved and he was negative for the lupus anticoagulant. It is not felt that he has anticardiolipin[i antibody syndrome.  We will continue to follow along with him and also have him take a full dose aspirin daily instead of the baby aspirin.  We will plan to see him again in another 4 months.   All questions were answered and he is in agreement with the plan. They can contact our office with any questions or concerns. We can certainly see him sooner if needed.   The patient was discussed with and also seen by Dr. Myna Hidalgo and he is in agreement with the aforementioned.   Emeline Gins, NP

## 2021-03-04 ENCOUNTER — Other Ambulatory Visit: Payer: Self-pay | Admitting: Family

## 2021-03-04 ENCOUNTER — Telehealth: Payer: Self-pay

## 2021-03-04 DIAGNOSIS — R760 Raised antibody titer: Secondary | ICD-10-CM

## 2021-03-04 DIAGNOSIS — I639 Cerebral infarction, unspecified: Secondary | ICD-10-CM

## 2021-03-04 LAB — KAPPA/LAMBDA LIGHT CHAINS
Kappa free light chain: 62 mg/L — ABNORMAL HIGH (ref 3.3–19.4)
Kappa, lambda light chain ratio: 2.52 — ABNORMAL HIGH (ref 0.26–1.65)
Lambda free light chains: 24.6 mg/L (ref 5.7–26.3)

## 2021-03-04 LAB — IGG, IGA, IGM
IgA: 197 mg/dL (ref 90–386)
IgG (Immunoglobin G), Serum: 1128 mg/dL (ref 603–1613)
IgM (Immunoglobulin M), Srm: 164 mg/dL (ref 20–172)

## 2021-03-04 NOTE — Telephone Encounter (Signed)
No 03/03/21 los    Rodney Rose 

## 2021-03-05 LAB — PROTEIN ELECTROPHORESIS, SERUM
A/G Ratio: 1.2 (ref 0.7–1.7)
Albumin ELP: 3.7 g/dL (ref 2.9–4.4)
Alpha-1-Globulin: 0.2 g/dL (ref 0.0–0.4)
Alpha-2-Globulin: 0.8 g/dL (ref 0.4–1.0)
Beta Globulin: 0.9 g/dL (ref 0.7–1.3)
Gamma Globulin: 1.1 g/dL (ref 0.4–1.8)
Globulin, Total: 3 g/dL (ref 2.2–3.9)
Total Protein ELP: 6.7 g/dL (ref 6.0–8.5)

## 2021-03-05 LAB — CARDIOLIPIN ANTIBODIES, IGG, IGM, IGA
Anticardiolipin IgA: <9 APL U/mL (ref 0–11)
Anticardiolipin IgG: <9 GPL U/mL (ref 0–14)
Anticardiolipin IgM: 32 MPL U/mL — ABNORMAL HIGH (ref 0–12)

## 2021-03-05 LAB — LUPUS ANTICOAGULANT PANEL
DRVVT: 49.3 s — ABNORMAL HIGH (ref 0.0–47.0)
PTT Lupus Anticoagulant: 35.6 s (ref 0.0–51.9)

## 2021-03-05 LAB — DRVVT MIX: dRVVT Mix: 40 s (ref 0.0–40.4)

## 2021-03-06 ENCOUNTER — Telehealth (HOSPITAL_BASED_OUTPATIENT_CLINIC_OR_DEPARTMENT_OTHER): Payer: BLUE CROSS/BLUE SHIELD | Admitting: Family

## 2021-03-06 DIAGNOSIS — D6861 Antiphospholipid syndrome: Secondary | ICD-10-CM | POA: Diagnosis not present

## 2021-03-06 NOTE — Telephone Encounter (Signed)
I was able to speak with Rodney Rose and go over recent lab work with him. He will start taking a full dose 325 mg aspirin PO daily. He verbalized understanding and agreement. No other questions at this time. Patient appreciative of the call.  I also called his sister and left a message with call back number on her personal voicemail to go over labs and switch to full dose aspirin daily.

## 2021-03-16 ENCOUNTER — Encounter (HOSPITAL_COMMUNITY): Payer: Self-pay

## 2021-03-16 ENCOUNTER — Ambulatory Visit (HOSPITAL_COMMUNITY)
Admission: EM | Admit: 2021-03-16 | Discharge: 2021-03-16 | Disposition: A | Discharge status: Home / Self Care | Payer: BLUE CROSS/BLUE SHIELD | Attending: Student | Admitting: Student

## 2021-03-16 ENCOUNTER — Other Ambulatory Visit: Payer: Self-pay

## 2021-03-16 DIAGNOSIS — I10 Essential (primary) hypertension: Secondary | ICD-10-CM

## 2021-03-16 DIAGNOSIS — A084 Viral intestinal infection, unspecified: Secondary | ICD-10-CM

## 2021-03-16 DIAGNOSIS — R112 Nausea with vomiting, unspecified: Secondary | ICD-10-CM | POA: Diagnosis not present

## 2021-03-16 MED ORDER — LOPERAMIDE HCL 2 MG PO CAPS: 2.0000 mg | ORAL_CAPSULE | Freq: Four times a day (QID) | ORAL | 0 refills | Status: DC | PRN

## 2021-03-16 MED ORDER — ONDANSETRON 8 MG PO TBDP: 8.0000 mg | ORAL_TABLET | Freq: Three times a day (TID) | ORAL | 0 refills | Status: DC | PRN

## 2021-03-16 NOTE — Discharge Instructions (Addendum)
-  Take the nausea medication-Zofran (ondansetron), up to 3 times daily for nausea and vomiting.  You can dissolve this under your tongue or between your teeth and your cheek. -I also sent an antidiarrheal medication in case you develop diarrhea.  This is Imodium (loperamide), up to 4 times daily or every 6 hours as needed for diarrhea. -Make sure to drink plenty of fluids and eat a bland diet as tolerated. -Continue your hypertension medications as directed. -Come back and see [place] if you are unable to keep fluids down despite treatment; or if you develop new symptoms like fevers/chills that won't reduce with Tylenol, chest pain, shortness of breath, dizziness.

## 2021-03-16 NOTE — ED Triage Notes (Signed)
Pt in with c/o vomiting that started last night  Pt took pepto bismol with no relief  Denies any cough, congestion, runny nose

## 2021-03-16 NOTE — ED Provider Notes (Signed)
[facility]    CSN: [id number] Arrival date & time: 03/16/21  1946      History   Chief Complaint Chief Complaint  Patient presents with  . Vomiting    HPI Rodney Rose is a 45 y.o. male presenting with vomiting that started 1 night ago.  History of bipolar disorder, hypertension, schizophrenia, stroke, chronic back pain.  Endorses 5 episodes of watery vomiting over the last 24 hours.  Has tried Pepto-Bismol without improvement.  Able to keep some fluids down but not food.  Denies diarrhea, abdominal pain, fever/chills, cough, congestion, chest pain, dizziness.  Taking antihypertensives as directed. No current diarrhea. Having normal bowel movements and passing gas.  HPI  Past Medical History:  Diagnosis Date  . Bipolar disorder (HCC)    Dr. Georgia Lopes with [facility]  . Chronic back pain   . Hypertension 2017  . Schizophrenia (HCC)   . Stroke [facility]) 11/2020    Patient Active Problem List   Diagnosis Date Noted  . CKD (chronic kidney disease), stage III with proteinuria (HCC) 12/11/2020  . Swelling of left hand 12/11/2020  . Type 2 diabetes mellitus with diabetic nephropathy, without long-term current use of insulin (HCC)   . Acute on chronic anemia   . CVA (cerebral vascular accident) (HCC) 12/02/2020  . Essential hypertension, benign 11/02/2018  . Tobacco use disorder 11/02/2018  . Bipolar disorder (HCC) 11/02/2018    Past Surgical History:  Procedure Laterality Date  . BUBBLE STUDY  12/05/2020   Procedure: BUBBLE STUDY;  Surgeon: Wendall Stade, MD;  Location: [facility] ENDOSCOPY;  Service: Cardiovascular;;  . NO PAST SURGERIES  10/2018  . TEE WITHOUT CARDIOVERSION N/A 12/05/2020   Procedure: TRANSESOPHAGEAL ECHOCARDIOGRAM (TEE);  Surgeon: Wendall Stade, MD;  Location: [facility] ENDOSCOPY;  Service: Cardiovascular;  Laterality: N/A;       Home Medications    Prior to Admission medications   Medication Sig Start Date End Date Taking? Authorizing  Provider  loperamide (IMODIUM) 2 MG capsule Take 1 capsule (2 mg total) by mouth 4 (four) times daily as needed for diarrhea or loose stools. 03/16/21  Yes Rhys Martini, PA-C  ondansetron (ZOFRAN ODT) 8 MG disintegrating tablet Take 1 tablet (8 mg total) by mouth every 8 (eight) hours as needed for nausea or vomiting. 03/16/21  Yes Rhys Martini, PA-C  amLODipine (NORVASC) 10 MG tablet Take 1 tablet (10 mg total) by mouth daily. 01/13/21   Dellia Cloud, MD  aspirin 81 MG EC tablet Take 1 tablet (81 mg total) by mouth daily. Swallow whole. 01/13/21   Dellia Cloud, MD  atorvastatin (LIPITOR) 80 MG tablet Take 1 tablet (80 mg total) by mouth daily. 01/13/21   Dellia Cloud, MD  carvedilol (COREG) 12.5 MG tablet Take 1 tablet (12.5 mg total) by mouth 2 (two) times daily with a meal. 01/13/21 04/13/21  Dellia Cloud, MD  dapagliflozin propanediol (FARXIGA) 5 MG TABS tablet Take 1 tablet (5 mg total) by mouth daily before breakfast. 01/13/21   Dellia Cloud, MD  haloperidol (HALDOL) 5 MG tablet Take 1 tablet (5 mg total) by mouth at bedtime. 07/18/20 12/02/20  Nelly Rout, MD  hydrochlorothiazide (MICROZIDE) 12.5 MG capsule Take 2 capsules (25 mg total) by mouth daily. 01/15/21 04/15/21  Steffanie Rainwater, MD  losartan (COZAAR) 100 MG tablet Take 1 tablet (100 mg total) by mouth daily. 01/15/21 04/15/21  Steffanie Rainwater, MD  metFORMIN (GLUCOPHAGE) 1000 MG tablet Take 1 tablet (1,000 mg total) by  mouth daily with breakfast. 01/13/21 01/13/22  Dellia Cloudoe, Benjamin, MD  Azilsartan-Chlorthalidone 40-12.5 MG TABS Take 1 tablet by mouth daily. Patient not taking: Reported on 04/09/2019 11/02/18 01/23/20  Tysinger, Kermit Baloavid S, PA-C  glyBURIDE-metformin (GLUCOVANCE) 2.5-500 MG tablet Take 1 tablet by mouth 2 (two) times daily with a meal. 04/11/19 03/29/20  Tysinger, Kermit Baloavid S, PA-C    Family History Family History  Problem Relation Age of Onset  . Hypertension Mother   . COPD Father   . Asthma Sister   . Heart disease Neg  Hx   . Stroke Neg Hx   . Diabetes Neg Hx     Social History Social History   Tobacco Use  . Smoking status: Current Every Day Smoker    Packs/day: 0.50    Years: 6.00    Pack years: 3.00    Types: Cigarettes  . Smokeless tobacco: Never Used  Vaping Use  . Vaping Use: Never used  Substance Use Topics  . Alcohol use: No  . Drug use: No     Allergies   Penicillins   Review of Systems Review of Systems  Constitutional: Negative for appetite change, chills, diaphoresis, fever and unexpected weight change.  HENT: Negative for congestion, ear pain, sinus pressure, sinus pain, sneezing, sore throat and trouble swallowing.   Respiratory: Negative for cough, chest tightness and shortness of breath.   Cardiovascular: Negative for chest pain.  Gastrointestinal: Positive for nausea and vomiting. Negative for abdominal distention, abdominal pain, anal bleeding, blood in stool, constipation, diarrhea and rectal pain.  Genitourinary: Negative for dysuria, flank pain, frequency and urgency.  Musculoskeletal: Negative for back pain and myalgias.  Neurological: Negative for dizziness, light-headedness and headaches.  All other systems reviewed and are negative.    Physical Exam Triage Vital Signs ED Triage Vitals  Enc Vitals Group     BP 03/16/21 1956 (!) 169/82     Pulse Rate 03/16/21 1955 99     Resp 03/16/21 1955 19     Temp 03/16/21 1955 98.6 F (37 C)     Temp src --      SpO2 03/16/21 1955 100 %     Weight --      Height --      Head Circumference --      Peak Flow --      Pain Score 03/16/21 1954 0     Pain Loc --      Pain Edu? --      Excl. in GC? --    No data found.  Updated Vital Signs BP (!) 169/82   Pulse 99   Temp 98.6 F (37 C)   Resp 19   SpO2 100%   Visual Acuity Right Eye Distance:   Left Eye Distance:   Bilateral Distance:    Right Eye Near:   Left Eye Near:    Bilateral Near:     Physical Exam Vitals reviewed.  Constitutional:       General: He is not in acute distress.    Appearance: Normal appearance. He is not ill-appearing.  HENT:     Head: Normocephalic and atraumatic.     Mouth/Throat:     Mouth: Mucous membranes are moist.     Comments: Moist mucous membranes Eyes:     Extraocular Movements: Extraocular movements intact.     Pupils: Pupils are equal, round, and reactive to light.  Cardiovascular:     Rate and Rhythm: Normal rate and regular rhythm.  Heart sounds: Normal heart sounds.  Pulmonary:     Effort: Pulmonary effort is normal.     Breath sounds: Normal breath sounds. No wheezing, rhonchi or rales.  Abdominal:     General: Bowel sounds are increased. There is no distension.     Palpations: Abdomen is soft. There is no mass.     Tenderness: There is no abdominal tenderness. There is no right CVA tenderness, left CVA tenderness, guarding or rebound. Negative signs include Murphy's sign, Rovsing's sign and McBurney's sign.  Skin:    General: Skin is warm.     Capillary Refill: Capillary refill takes less than 2 seconds.     Comments: Good skin turgor  Neurological:     General: No focal deficit present.     Mental Status: He is alert and oriented to person, place, and time.  Psychiatric:        Mood and Affect: Mood normal.        Behavior: Behavior normal.      UC Treatments / Results  Labs (all labs ordered are listed, but only abnormal results are displayed) Labs Reviewed - No data to display  EKG   Radiology No results found.  Procedures Procedures (including critical care time)  Medications Ordered in UC Medications - No data to display  Initial Impression / Assessment and Plan / UC Course  I have reviewed the triage vital signs and the nursing notes.  Pertinent labs & imaging results that were available during my care of the patient were reviewed by me and considered in my medical decision making (see chart for details).     This patient is a 45 year old male  presenting with viral gastroenteritis.  Today this pt is afebrile nontachycardic nontachypneic, oxygenating well on room air, no wheezes rhonchi or rales, without abdominal pain. Appears well hydrated.  Zofran ODT and Imodium sent.  Rec good hydration, brat diet.  For hypertension, continue current regimen.  Return precautions discussed.  This chart was dictated using voice recognition software, Dragon. Despite the best efforts of this provider to proofread and correct errors, errors may still occur which can change documentation meaning.  Final Clinical Impressions(s) / UC Diagnoses   Final diagnoses:  Essential hypertension  Non-intractable vomiting with nausea, unspecified vomiting type  Viral gastroenteritis     Discharge Instructions     -Take the nausea medication-Zofran (ondansetron), up to 3 times daily for nausea and vomiting.  You can dissolve this under your tongue or between your teeth and your cheek. -I also sent an antidiarrheal medication in case you develop diarrhea.  This is Imodium (loperamide), up to 4 times daily or every 6 hours as needed for diarrhea. -Make sure to drink plenty of fluids and eat a bland diet as tolerated. -Continue your hypertension medications as directed. -Come back and see [place] if you are unable to keep fluids down despite treatment; or if you develop new symptoms like fevers/chills that won't reduce with Tylenol, chest pain, shortness of breath, dizziness.    ED Prescriptions    Medication Sig Dispense Auth. Provider   ondansetron (ZOFRAN ODT) 8 MG disintegrating tablet Take 1 tablet (8 mg total) by mouth every 8 (eight) hours as needed for nausea or vomiting. 20 tablet Rhys Martini, PA-C   loperamide (IMODIUM) 2 MG capsule Take 1 capsule (2 mg total) by mouth 4 (four) times daily as needed for diarrhea or loose stools. 12 capsule Rhys Martini, PA-C     PDMP not  reviewed this encounter.   Rhys Martini, PA-C 03/16/21 2017

## 2021-04-18 ENCOUNTER — Other Ambulatory Visit: Payer: Self-pay | Admitting: Student

## 2021-04-18 DIAGNOSIS — I10 Essential (primary) hypertension: Secondary | ICD-10-CM

## 2021-04-20 MED ORDER — LOSARTAN POTASSIUM-HCTZ 100-25 MG PO TABS: 1.0000 | ORAL_TABLET | Freq: Every day | ORAL | 2 refills | Status: DC

## 2021-04-20 NOTE — Telephone Encounter (Signed)
Called pharmacy to check availability of Hyzaar which patient was on previously. They informed me that they have it so I put patient back on Hyzaar instead of the separate losartan and HCTZ.

## 2021-05-06 ENCOUNTER — Ambulatory Visit: Payer: BLUE CROSS/BLUE SHIELD | Admitting: Adult Health

## 2021-06-07 ENCOUNTER — Other Ambulatory Visit: Payer: Self-pay | Admitting: Student

## 2021-06-07 DIAGNOSIS — I639 Cerebral infarction, unspecified: Secondary | ICD-10-CM

## 2021-06-07 DIAGNOSIS — I6300 Cerebral infarction due to thrombosis of unspecified precerebral artery: Secondary | ICD-10-CM

## 2021-06-07 NOTE — Progress Notes (Signed)
Received a message to change patient's PT referral to OT due to patient having left upper extremity deficits only. Referral has been sent.

## 2021-06-15 ENCOUNTER — Encounter: Payer: Self-pay | Admitting: *Deleted

## 2021-06-25 ENCOUNTER — Ambulatory Visit: Payer: BLUE CROSS/BLUE SHIELD | Admitting: Adult Health

## 2021-07-01 ENCOUNTER — Ambulatory Visit: Payer: BLUE CROSS/BLUE SHIELD | Admitting: Occupational Therapy

## 2021-07-14 ENCOUNTER — Ambulatory Visit: Payer: BLUE CROSS/BLUE SHIELD | Attending: Neurology | Admitting: Occupational Therapy

## 2021-07-14 ENCOUNTER — Other Ambulatory Visit: Payer: Self-pay

## 2021-07-14 ENCOUNTER — Encounter: Payer: Self-pay | Admitting: Occupational Therapy

## 2021-07-14 DIAGNOSIS — G8929 Other chronic pain: Secondary | ICD-10-CM | POA: Diagnosis present

## 2021-07-14 DIAGNOSIS — M25532 Pain in left wrist: Secondary | ICD-10-CM | POA: Insufficient documentation

## 2021-07-14 DIAGNOSIS — M25612 Stiffness of left shoulder, not elsewhere classified: Secondary | ICD-10-CM | POA: Insufficient documentation

## 2021-07-14 DIAGNOSIS — M6281 Muscle weakness (generalized): Secondary | ICD-10-CM | POA: Insufficient documentation

## 2021-07-14 DIAGNOSIS — R600 Localized edema: Secondary | ICD-10-CM | POA: Insufficient documentation

## 2021-07-14 DIAGNOSIS — M25622 Stiffness of left elbow, not elsewhere classified: Secondary | ICD-10-CM | POA: Insufficient documentation

## 2021-07-14 DIAGNOSIS — M25512 Pain in left shoulder: Secondary | ICD-10-CM | POA: Insufficient documentation

## 2021-07-14 DIAGNOSIS — R208 Other disturbances of skin sensation: Secondary | ICD-10-CM | POA: Insufficient documentation

## 2021-07-14 DIAGNOSIS — M25642 Stiffness of left hand, not elsewhere classified: Secondary | ICD-10-CM | POA: Diagnosis present

## 2021-07-14 DIAGNOSIS — R278 Other lack of coordination: Secondary | ICD-10-CM | POA: Diagnosis present

## 2021-07-14 NOTE — Therapy (Signed)
[facility] [address] [place], [place], [address] Phone: [phone]   Fax:  [phone]  Occupational Therapy Evaluation  Patient Details  Name: Rodney Rose MRN: [id number] Date of Birth: 1976-02-15 Referring Provider (OT): Dr Oswaldo Done   Encounter Date: 07/14/2021   OT End of Session - 07/14/21 1839     Visit Number 1    Number of Visits 17    Date for OT Re-Evaluation 09/27/21    Authorization Type [employer]-Out of Network    OT Start Time 1615    OT Stop Time 1700    OT Time Calculation (min) 45 min    Behavior During Therapy [facility] for tasks assessed/performed             Past Medical History:  Diagnosis Date   Bipolar disorder (HCC)    Dr. Georgia Lopes with [facility]   Chronic back pain    Hypertension 2017   Schizophrenia [facility])    Stroke (HCC) 11/2020    Past Surgical History:  Procedure Laterality Date   BUBBLE STUDY  12/05/2020   Procedure: BUBBLE STUDY;  Surgeon: Wendall Stade, MD;  Location: [facility] ENDOSCOPY;  Service: Cardiovascular;;   NO PAST SURGERIES  10/2018   TEE WITHOUT CARDIOVERSION N/A 12/05/2020   Procedure: TRANSESOPHAGEAL ECHOCARDIOGRAM (TEE);  Surgeon: Wendall Stade, MD;  Location: [facility] ENDOSCOPY;  Service: Cardiovascular;  Laterality: N/A;    There were no vitals filed for this visit.   Subjective Assessment - 07/14/21 1618     Subjective  I want to get my arm and hand muscles working so I can get a job.    Currently in Pain? No/denies               [facility] OT Assessment - 07/14/21 0001       Assessment   Medical Diagnosis Stroke    Referring Provider (OT) Dr Oswaldo Done    Onset Date/Surgical Date 12/16/21    Hand Dominance Right    Prior Therapy NA      Prior Function   Level of Independence Independent with basic ADLs    Vocation Part time employment    Scientist, water quality    Leisure walks      ADL   Eating/Feeding Minimal assistance     Grooming --   increased time   Upper Body Bathing --   Increased time   Lower Body Bathing --   increased time   Upper Body Dressing Needs assist for fasteners    Lower Body Dressing Increased time   struggles with pants, socks, cannot tie shoes   Toilet Transfer Modified independent    Toileting - Clothing Manipulation Modified independent    Toileting -  Hygiene Modified Independent    Tub/Shower Transfer Modified independent    ADL comments Lives with mom and sister, Uses primarilly RUE.      IADL   Prior Level of Function Shopping independent    Shopping Needs to be accompanied on any shopping trip    Prior Level of Function Light Housekeeping Independent    Light Housekeeping Performs light daily tasks such as dishwashing, bed making    Prior Level of Function Meal Prep Independence    Meal Prep --   can get himself a snack     Vision - History   Baseline Vision No visual deficits      Vision Assessment   Eye Alignment Within Functional Limits  Activity Tolerance   Activity Tolerance --   fatigues more quickly     Cognition   Overall Cognitive Status Within Functional Limits for tasks assessed    Cognition Comments h/o Bipolar, schizophrenia      Observation/Other Assessments   Focus on Therapeutic Outcomes (FOTO)  NA - Chronic      Posture/Postural Control   Posture/Postural Control Postural limitations    Postural Limitations Posterior pelvic tilt    Posture Comments mild coordination deficit in trunk - noticeable in mid range      Sensation   Light Touch Impaired by gross assessment    Stereognosis Appears Intact    Hot/Cold Appears Intact    Proprioception Appears Intact    Additional Comments Reports numbness      Coordination   Gross Motor Movements are Fluid and Coordinated No    Fine Motor Movements are Fluid and Coordinated No    9 Hole Peg Test Right;Left    Right 9 Hole Peg Test 33.46    Left 9 Hole Peg Test 2.20.50    Box and Blocks 14       Perception   Perception Within Functional Limits      Praxis   Praxis Intact      Tone   Assessment Location Left Upper Extremity      ROM / Strength   AROM / PROM / Strength AROM;PROM;Strength      AROM   Overall AROM  Deficits    AROM Assessment Site Shoulder;Elbow;Forearm;Wrist    Right/Left Shoulder Left    Left Shoulder Flexion 70 Degrees    Left Shoulder ABduction 55 Degrees    Right/Left Elbow Left    Left Elbow Extension 90    Right/Left Forearm Left    Left Forearm Supination 70 Degrees    Right/Left Wrist Left    Left Wrist Extension 20 Degrees      PROM   Overall PROM  Deficits    PROM Assessment Site Shoulder;Elbow    Right/Left Shoulder Left    Left Shoulder Flexion 110 Degrees    Right/Left Elbow Left    Left Elbow Extension 160      Hand Function   Right Hand Gross Grasp Functional    Right Hand Grip (lbs) 80    Left Hand Gross Grasp Impaired    Left Hand Grip (lbs) 18.5      LUE Tone   LUE Tone Mild;Hypertonic      LUE Tone   Hypertonic Details elbow flexors, pronators, wrist flexors                             OT Education - 07/14/21 1838     Education Details Results of OT eval and potential goals    Person(s) Educated Patient    Methods Explanation    Comprehension Verbalized understanding              OT Short Term Goals - 07/14/21 1847       OT SHORT TERM GOAL #1   Title Patient will complete an HEP designed to improve PROM to LUE    Baseline No HEP    Time 4    Period Weeks    Status New    Target Date 08/28/21      OT SHORT TERM GOAL #2   Title Patient will report no increase in pain in left arm with passive stretching, or HEP  Baseline Pain ranges up to 5-6/10    Time 4    Period Weeks    Status New      OT SHORT TERM GOAL #3   Title Patient will demonstrate increase of three blocks in box and blocks test    Baseline 14    Time 4    Period Weeks    Status New      OT SHORT TERM GOAL #4    Title Patient will demonstrate ability to stabilize a potato with left hand to peel with right hand    Baseline unable    Time 4    Period Weeks    Status New      OT SHORT TERM GOAL #5   Title Patient will be able to cut food on his plate without assistance    Baseline Unable    Time 4    Period Weeks    Status New               OT Long Term Goals - 07/14/21 1850       OT LONG TERM GOAL #1   Title Patient will complete updated HEP    Baseline No HEP    Time 8    Period Weeks    Status New    Target Date 09/27/21      OT LONG TERM GOAL #2   Title Patient will utilize BUE to wash dishes x 5 min without dropping, or wrist pain    Baseline Unable    Time 8    Period Weeks    Status New      OT LONG TERM GOAL #3   Title Patient will tie shoes using BUE    Baseline unable    Time 8    Period Weeks    Status New      OT LONG TERM GOAL #4   Title Patient will demonstrate low level reach with LUE - shoulder flexion with elbow extension to grasp and release lightweight (less than 2lb) object    Baseline Unable to flex shoulder and extend elbow    Time 8    Period Weeks    Status New      OT LONG TERM GOAL #5   Title Patient will complete 9 hole peg test with LUE in less than 45 sec    Baseline 2 min 20 sec    Time 8    Period Weeks    Status New                   Plan - 07/14/21 1839     Clinical Impression Statement Patient is a 45 yr old male referred for OP OT following stroke in 11/2020.  Patient presents to OT evaluation with limited functional use of LUE.  Patient with limitations of ROM passive and active throughout LUE.  Patient has shoulder and wrist pain on left side, he reports constant numbness in left hand.  Patient has active movement, although movement slowl and poorly controlled.  Patient has potential for reduced pain in LUE and increased functional use of this extremity.    OT Occupational Profile and History Detailed Assessment-  Review of Records and additional review of physical, cognitive, psychosocial history related to current functional performance    Occupational performance deficits (Please refer to evaluation for details): ADL's;IADL's;Work    Body Structure / Function / Physical Skills ADL;Dexterity;Flexibility;Muscle spasms;ROM;Strength;Tone;IADL;FMC;Edema;Coordination;Balance;Body mechanics;Decreased knowledge of precautions;Endurance;Pain;Sensation;UE functional use;GMC;Decreased knowledge of use  of DME    Rehab Potential Good    Clinical Decision Making Several treatment options, min-mod task modification necessary    Comorbidities Affecting Occupational Performance: May have comorbidities impacting occupational performance    Modification or Assistance to Complete Evaluation  No modification of tasks or assist necessary to complete eval    OT Frequency 2x / week    OT Duration 8 weeks    OT Treatment/Interventions Self-care/ADL training;Cryotherapy;Therapeutic exercise;DME and/or AE instruction;Balance training;Functional Mobility Training;Aquatic Therapy;Electrical Stimulation;Ultrasound;Fluidtherapy;Neuromuscular education;Manual Therapy;Splinting;Visual/perceptual remediation/compensation;Moist Heat;Passive range of motion;Therapeutic activities;Patient/family education    Plan Needs HEP for self stretching program, Needs PROM left shoulder, elbow, forearm    Consulted and Agree with Plan of Care Patient             Patient will benefit from skilled therapeutic intervention in order to improve the following deficits and impairments:   Body Structure / Function / Physical Skills: ADL, Dexterity, Flexibility, Muscle spasms, ROM, Strength, Tone, IADL, FMC, Edema, Coordination, Balance, Body mechanics, Decreased knowledge of precautions, Endurance, Pain, Sensation, UE functional use, GMC, Decreased knowledge of use of DME       Visit Diagnosis: Stiffness of left shoulder, not elsewhere classified -  Plan: Ot plan of care cert/re-cert  Stiffness of left elbow, not elsewhere classified - Plan: Ot plan of care cert/re-cert  Stiffness of left hand, not elsewhere classified - Plan: Ot plan of care cert/re-cert  Chronic left shoulder pain - Plan: Ot plan of care cert/re-cert  Pain in left wrist - Plan: Ot plan of care cert/re-cert  Localized edema - Plan: Ot plan of care cert/re-cert  Muscle weakness (generalized) - Plan: Ot plan of care cert/re-cert  Other disturbances of skin sensation - Plan: Ot plan of care cert/re-cert  Other lack of coordination - Plan: Ot plan of care cert/re-cert    Problem List Patient Active Problem List   Diagnosis Date Noted   CKD (chronic kidney disease), stage III with proteinuria (HCC) 12/11/2020   Swelling of left hand 12/11/2020   Type 2 diabetes mellitus with diabetic nephropathy, without long-term current use of insulin (HCC)    Acute on chronic anemia    CVA (cerebral vascular accident) (HCC) 12/02/2020   Essential hypertension, benign 11/02/2018   Tobacco use disorder 11/02/2018   Bipolar disorder (HCC) 11/02/2018    Collier SalinaGellert, Tanasha Menees M, OTR/L 07/14/2021, 6:57 PM  [facility] [address] [place], [place], [address] Phone: [phone]   Fax:  [phone]  Name: Rodney Rose MRN: [id number] Date of Birth: 06/14/1976

## 2021-07-19 ENCOUNTER — Other Ambulatory Visit: Payer: Self-pay | Admitting: Student

## 2021-07-19 DIAGNOSIS — I10 Essential (primary) hypertension: Secondary | ICD-10-CM

## 2021-07-20 ENCOUNTER — Other Ambulatory Visit: Payer: Self-pay | Admitting: Internal Medicine

## 2021-07-21 ENCOUNTER — Encounter: Payer: BLUE CROSS/BLUE SHIELD | Admitting: Occupational Therapy

## 2021-07-22 ENCOUNTER — Ambulatory Visit: Payer: BLUE CROSS/BLUE SHIELD | Admitting: Occupational Therapy

## 2021-07-27 ENCOUNTER — Ambulatory Visit: Payer: BLUE CROSS/BLUE SHIELD | Admitting: Adult Health

## 2021-07-28 ENCOUNTER — Other Ambulatory Visit: Payer: Self-pay

## 2021-07-28 ENCOUNTER — Encounter: Payer: Self-pay | Admitting: Occupational Therapy

## 2021-07-28 ENCOUNTER — Ambulatory Visit: Payer: BLUE CROSS/BLUE SHIELD | Attending: Neurology | Admitting: Occupational Therapy

## 2021-07-28 DIAGNOSIS — R208 Other disturbances of skin sensation: Secondary | ICD-10-CM | POA: Diagnosis present

## 2021-07-28 DIAGNOSIS — M25612 Stiffness of left shoulder, not elsewhere classified: Secondary | ICD-10-CM | POA: Diagnosis not present

## 2021-07-28 DIAGNOSIS — G8929 Other chronic pain: Secondary | ICD-10-CM

## 2021-07-28 DIAGNOSIS — M25642 Stiffness of left hand, not elsewhere classified: Secondary | ICD-10-CM

## 2021-07-28 DIAGNOSIS — R278 Other lack of coordination: Secondary | ICD-10-CM | POA: Diagnosis present

## 2021-07-28 DIAGNOSIS — M6281 Muscle weakness (generalized): Secondary | ICD-10-CM | POA: Diagnosis present

## 2021-07-28 DIAGNOSIS — M25512 Pain in left shoulder: Secondary | ICD-10-CM | POA: Diagnosis present

## 2021-07-28 DIAGNOSIS — R600 Localized edema: Secondary | ICD-10-CM | POA: Diagnosis present

## 2021-07-28 DIAGNOSIS — M25532 Pain in left wrist: Secondary | ICD-10-CM | POA: Diagnosis present

## 2021-07-28 DIAGNOSIS — M25622 Stiffness of left elbow, not elsewhere classified: Secondary | ICD-10-CM

## 2021-07-28 NOTE — Patient Instructions (Signed)
Shoulder exercises: Lying on your back in bed - one pillow under head and one under left elbow.  Start with left arm just slightly away from your body.    Roll your body toward your left side.  Can practice bending and straightening left elbow, turning palm up and down, bending and straightening wrist while you are on your side.   Roll 3 times - if no pain- adjust your arm up slightly.   Roll 3 times, repeat.    Can advance this stretch by rolling body toward right side.  Go slowly and stop if any sharp pain.

## 2021-07-28 NOTE — Therapy (Signed)
[facility] [address] [place], [place], [address] Phone: [phone]   Fax:  [phone]  Occupational Therapy Treatment  Patient Details  Name: Rodney Rose MRN: [id number] Date of Birth: 09/08/1976 Referring Provider (OT): Dr Oswaldo Done   Encounter Date: 07/28/2021   OT End of Session - 07/28/21 1809     Visit Number 2    Number of Visits 17    Date for OT Re-Evaluation 09/27/21    Authorization Type [employer]-Out of Network    OT Start Time 1700    OT Stop Time 1745    OT Time Calculation (min) 45 min    Activity Tolerance Patient tolerated treatment well    Behavior During Therapy [facility] for tasks assessed/performed             Past Medical History:  Diagnosis Date   Bipolar disorder (HCC)    Dr. Georgia Lopes with [facility]   Chronic back pain    Hypertension 2017   Schizophrenia [facility])    Stroke (HCC) 11/2020    Past Surgical History:  Procedure Laterality Date   BUBBLE STUDY  12/05/2020   Procedure: BUBBLE STUDY;  Surgeon: Wendall Stade, MD;  Location: [facility] ENDOSCOPY;  Service: Cardiovascular;;   NO PAST SURGERIES  10/2018   TEE WITHOUT CARDIOVERSION N/A 12/05/2020   Procedure: TRANSESOPHAGEAL ECHOCARDIOGRAM (TEE);  Surgeon: Wendall Stade, MD;  Location: [facility] ENDOSCOPY;  Service: Cardiovascular;  Laterality: N/A;    There were no vitals filed for this visit.   Subjective Assessment - 07/28/21 1705     Subjective  Patient reports wrist discomfort - 5/10 constant    Currently in Pain? Yes    Pain Score 5     Pain Location Wrist    Pain Orientation Left    Pain Descriptors / Indicators Aching    Pain Type Acute pain    Pain Onset 1 to 4 weeks ago    Pain Frequency Constant    Aggravating Factors  lifting items    Pain Relieving Factors rest, reducing movement                          OT Treatments/Exercises (OP) - 07/28/21 0001       Neurological Re-education  Exercises   Other Exercises 1 Started gentle body on arm exercises rolling onto left side to stretch shoulder capsule, and lengthen left arm.  Patient able to repeat, and will start as HEP.  Worked on isolating elbow, forearm, wrist and digit motion in sidelying.    Other Exercises 2 UE ranger in sitting and standing to address active assisted range and control of reach patterns in LUE.  Patient able to move through greater ranges with this guided motion, and without pain then more open chain conditions.  Patient issued black wrist brace to support wrist and to hopefully reduce pain.  Patient with mild swelling dorsum left wrist, and pain central carpals - wrist maintained in passively flexed position.  Need to encourage isolated wrist extension.                    OT Education - 07/28/21 1808     Education Details OT short and long term goals.  Body on arm rolling exercises to start HEP, Wrist splint wearing schedule    Person(s) Educated Patient    Methods Explanation;Demonstration;Verbal cues;Handout    Comprehension Verbalized understanding;Returned demonstration  OT Short Term Goals - 07/28/21 1709       OT SHORT TERM GOAL #1   Title Patient will complete an HEP designed to improve PROM to LUE    Baseline No HEP    Time 4    Period Weeks    Status On-going    Target Date 08/28/21      OT SHORT TERM GOAL #2   Title Patient will report no increase in pain in left arm with passive stretching, or HEP    Baseline Pain ranges up to 5-6/10    Time 4    Period Weeks    Status On-going      OT SHORT TERM GOAL #3   Title Patient will demonstrate increase of three blocks in box and blocks test    Baseline 14    Time 4    Period Weeks    Status On-going      OT SHORT TERM GOAL #4   Title Patient will demonstrate ability to stabilize a potato with left hand to peel with right hand    Baseline unable    Time 4    Period Weeks    Status On-going      OT  SHORT TERM GOAL #5   Title Patient will be able to cut food on his plate without assistance    Baseline Unable    Time 4    Period Weeks    Status On-going               OT Long Term Goals - 07/28/21 1710       OT LONG TERM GOAL #1   Title Patient will complete updated HEP    Baseline No HEP    Time 8    Period Weeks    Status On-going      OT LONG TERM GOAL #2   Title Patient will utilize BUE to wash dishes x 5 min without dropping, or wrist pain    Baseline Unable    Time 8    Period Weeks    Status On-going      OT LONG TERM GOAL #3   Title Patient will tie shoes using BUE    Baseline unable    Time 8    Period Weeks    Status On-going      OT LONG TERM GOAL #4   Title Patient will demonstrate low level reach with LUE - shoulder flexion with elbow extension to grasp and release lightweight (less than 2lb) object    Baseline Unable to flex shoulder and extend elbow    Time 8    Period Weeks    Status On-going      OT LONG TERM GOAL #5   Title Patient will complete 9 hole peg test with LUE in less than 45 sec    Baseline 2 min 20 sec    Time 8    Period Weeks    Status On-going                   Plan - 07/28/21 1809     Clinical Impression Statement Patient has active movement throughout LUE but muscle activation is unbalanced, and stiffness and pain have become problematic.  Patient is motivated for improved UE function as he wants to return to work - he worked part time as a Public affairs consultant prior to stroke    OT Occupational Profile and History Detailed Assessment- Review of Records and additional  review of physical, cognitive, psychosocial history related to current functional performance    Occupational performance deficits (Please refer to evaluation for details): ADL's;IADL's;Work    Body Structure / Function / Physical Skills ADL;Dexterity;Flexibility;Muscle spasms;ROM;Strength;Tone;IADL;FMC;Edema;Coordination;Balance;Body  mechanics;Decreased knowledge of precautions;Endurance;Pain;Sensation;UE functional use;GMC;Decreased knowledge of use of DME    Rehab Potential Good    Clinical Decision Making Several treatment options, min-mod task modification necessary    Comorbidities Affecting Occupational Performance: May have comorbidities impacting occupational performance    Modification or Assistance to Complete Evaluation  No modification of tasks or assist necessary to complete eval    OT Frequency 2x / week    OT Duration 8 weeks    OT Treatment/Interventions Self-care/ADL training;Cryotherapy;Therapeutic exercise;DME and/or AE instruction;Balance training;Functional Mobility Training;Aquatic Therapy;Electrical Stimulation;Ultrasound;Fluidtherapy;Neuromuscular education;Manual Therapy;Splinting;Visual/perceptual remediation/compensation;Moist Heat;Passive range of motion;Therapeutic activities;Patient/family education    Plan Assess need for custom wrist brace.  (has black wrist cock up for now) Check HEP and add any wrist component or assisted LUE movements.    Consulted and Agree with Plan of Care Patient             Patient will benefit from skilled therapeutic intervention in order to improve the following deficits and impairments:   Body Structure / Function / Physical Skills: ADL, Dexterity, Flexibility, Muscle spasms, ROM, Strength, Tone, IADL, FMC, Edema, Coordination, Balance, Body mechanics, Decreased knowledge of precautions, Endurance, Pain, Sensation, UE functional use, GMC, Decreased knowledge of use of DME       Visit Diagnosis: Stiffness of left shoulder, not elsewhere classified  Stiffness of left elbow, not elsewhere classified  Stiffness of left hand, not elsewhere classified  Chronic left shoulder pain  Pain in left wrist  Localized edema  Muscle weakness (generalized)  Other disturbances of skin sensation  Other lack of coordination    Problem List Patient Active  Problem List   Diagnosis Date Noted   CKD (chronic kidney disease), stage III with proteinuria (HCC) 12/11/2020   Swelling of left hand 12/11/2020   Type 2 diabetes mellitus with diabetic nephropathy, without long-term current use of insulin (HCC)    Acute on chronic anemia    CVA (cerebral vascular accident) (HCC) 12/02/2020   Essential hypertension, benign 11/02/2018   Tobacco use disorder 11/02/2018   Bipolar disorder (HCC) 11/02/2018    Collier Salina, OTR/L 07/28/2021, 6:14 PM  [facility] [address] [place], [place], [address] Phone: [phone]   Fax:  [phone]  Name: Rodney Rose MRN: [id number] Date of Birth: 08/16/1976

## 2021-07-29 ENCOUNTER — Encounter: Payer: Self-pay | Admitting: Occupational Therapy

## 2021-07-29 ENCOUNTER — Ambulatory Visit: Payer: BLUE CROSS/BLUE SHIELD | Admitting: Occupational Therapy

## 2021-07-29 DIAGNOSIS — M25612 Stiffness of left shoulder, not elsewhere classified: Secondary | ICD-10-CM | POA: Diagnosis not present

## 2021-07-29 DIAGNOSIS — R208 Other disturbances of skin sensation: Secondary | ICD-10-CM

## 2021-07-29 DIAGNOSIS — M25512 Pain in left shoulder: Secondary | ICD-10-CM

## 2021-07-29 DIAGNOSIS — M25642 Stiffness of left hand, not elsewhere classified: Secondary | ICD-10-CM

## 2021-07-29 DIAGNOSIS — R278 Other lack of coordination: Secondary | ICD-10-CM

## 2021-07-29 DIAGNOSIS — G8929 Other chronic pain: Secondary | ICD-10-CM

## 2021-07-29 DIAGNOSIS — M6281 Muscle weakness (generalized): Secondary | ICD-10-CM

## 2021-07-29 DIAGNOSIS — M25532 Pain in left wrist: Secondary | ICD-10-CM

## 2021-07-29 DIAGNOSIS — M25622 Stiffness of left elbow, not elsewhere classified: Secondary | ICD-10-CM

## 2021-07-29 NOTE — Therapy (Signed)
[facility] [address] [place], [place], [address] Phone: [phone]   Fax:  [phone]  Occupational Therapy Treatment  Patient Details  Name: Rodney Rose MRN: [id number] Date of Birth: May 10, 1976 Referring Provider (OT): Dr Oswaldo Done   Encounter Date: 07/29/2021   OT End of Session - 07/29/21 1530     Visit Number 3    Number of Visits 17    Date for OT Re-Evaluation 09/27/21    Authorization Type [employer]    OT Start Time 1530    OT Stop Time 1612    OT Time Calculation (min) 42 min    Activity Tolerance Patient tolerated treatment well    Behavior During Therapy [facility] for tasks assessed/performed             Past Medical History:  Diagnosis Date   Bipolar disorder (HCC)    Dr. Georgia Lopes with [facility] Psychiatry   Chronic back pain    Hypertension 2017   Schizophrenia [facility])    Stroke (HCC) 11/2020    Past Surgical History:  Procedure Laterality Date   BUBBLE STUDY  12/05/2020   Procedure: BUBBLE STUDY;  Surgeon: Wendall Stade, MD;  Location: [facility] ENDOSCOPY;  Service: Cardiovascular;;   NO PAST SURGERIES  10/2018   TEE WITHOUT CARDIOVERSION N/A 12/05/2020   Procedure: TRANSESOPHAGEAL ECHOCARDIOGRAM (TEE);  Surgeon: Wendall Stade, MD;  Location: [facility] ENDOSCOPY;  Service: Cardiovascular;  Laterality: N/A;    There were no vitals filed for this visit.   Subjective Assessment - 07/29/21 1528     Subjective  Pt reports no pain today - wearing brace and it is helping.    Currently in Pain? Yes    Pain Score 4     Pain Location Shoulder    Pain Orientation Left    Pain Descriptors / Indicators Aching    Pain Type Acute pain    Pain Onset 1 to 4 weeks ago    Pain Frequency Constant    Aggravating Factors  movement    Pain Relieving Factors rest, relaxing it                HEP for supine Self PROM and functional basic activities to do with LUE - see pt instructions  Pt  demonstrates difficulty with initiating shoulder flexion with LUE but able to maintain position and coordinate eccentric movements.   Weight bearing in LUE for facilitation of muscle activation and increased range of motion and stretching in wrist while reaching with RUE to obtain and place medium cone. Pt required min assistance for maintaining hand position on mat for weight bearing.   Simulated stirring items and  flipping burgers in ADL kitchen. Pt was able to simulate this activity. Encouraged to try and mom or sister at home to troubleshoot.                 OT Education - 07/29/21 1605     Education Details Supine Self PROM exercises HEP. began talking about functional activities to do with LUE.    Person(s) Educated Patient    Methods Explanation;Demonstration;Verbal cues;Handout    Comprehension Verbalized understanding;Returned demonstration              OT Short Term Goals - 07/28/21 1709       OT SHORT TERM GOAL #1   Title Patient will complete an HEP designed to improve PROM to LUE    Baseline No HEP  Time 4    Period Weeks    Status On-going    Target Date 08/28/21      OT SHORT TERM GOAL #2   Title Patient will report no increase in pain in left arm with passive stretching, or HEP    Baseline Pain ranges up to 5-6/10    Time 4    Period Weeks    Status On-going      OT SHORT TERM GOAL #3   Title Patient will demonstrate increase of three blocks in box and blocks test    Baseline 14    Time 4    Period Weeks    Status On-going      OT SHORT TERM GOAL #4   Title Patient will demonstrate ability to stabilize a potato with left hand to peel with right hand    Baseline unable    Time 4    Period Weeks    Status On-going      OT SHORT TERM GOAL #5   Title Patient will be able to cut food on his plate without assistance    Baseline Unable    Time 4    Period Weeks    Status On-going               OT Long Term Goals - 07/28/21  1710       OT LONG TERM GOAL #1   Title Patient will complete updated HEP    Baseline No HEP    Time 8    Period Weeks    Status On-going      OT LONG TERM GOAL #2   Title Patient will utilize BUE to wash dishes x 5 min without dropping, or wrist pain    Baseline Unable    Time 8    Period Weeks    Status On-going      OT LONG TERM GOAL #3   Title Patient will tie shoes using BUE    Baseline unable    Time 8    Period Weeks    Status On-going      OT LONG TERM GOAL #4   Title Patient will demonstrate low level reach with LUE - shoulder flexion with elbow extension to grasp and release lightweight (less than 2lb) object    Baseline Unable to flex shoulder and extend elbow    Time 8    Period Weeks    Status On-going      OT LONG TERM GOAL #5   Title Patient will complete 9 hole peg test with LUE in less than 45 sec    Baseline 2 min 20 sec    Time 8    Period Weeks    Status On-going                   Plan - 07/29/21 1621     Clinical Impression Statement Pt is motivated for returning function in LUE. Pt with good active movement but req's cues for movement patterns and activation of muscle groups for certain movements. Pt with improved wrist pain in LUE with use of brace.    OT Occupational Profile and History Detailed Assessment- Review of Records and additional review of physical, cognitive, psychosocial history related to current functional performance    Occupational performance deficits (Please refer to evaluation for details): ADL's;IADL's;Work    Body Structure / Function / Physical Skills ADL;Dexterity;Flexibility;Muscle spasms;ROM;Strength;Tone;IADL;FMC;Edema;Coordination;Balance;Body mechanics;Decreased knowledge of precautions;Endurance;Pain;Sensation;UE functional use;GMC;Decreased knowledge of use of DME  Rehab Potential Good    Clinical Decision Making Several treatment options, min-mod task modification necessary    Comorbidities  Affecting Occupational Performance: May have comorbidities impacting occupational performance    Modification or Assistance to Complete Evaluation  No modification of tasks or assist necessary to complete eval    OT Frequency 2x / week    OT Duration 8 weeks    OT Treatment/Interventions Self-care/ADL training;Cryotherapy;Therapeutic exercise;DME and/or AE instruction;Balance training;Functional Mobility Training;Aquatic Therapy;Electrical Stimulation;Ultrasound;Fluidtherapy;Neuromuscular education;Manual Therapy;Splinting;Visual/perceptual remediation/compensation;Moist Heat;Passive range of motion;Therapeutic activities;Patient/family education    Plan Continue to check HEP and wrist brace, add to HEP PRN, work on functional tasks with LUE and stabilizing to increasing use for IADLs (cooking, cleaning)    Consulted and Agree with Plan of Care Patient             Patient will benefit from skilled therapeutic intervention in order to improve the following deficits and impairments:   Body Structure / Function / Physical Skills: ADL, Dexterity, Flexibility, Muscle spasms, ROM, Strength, Tone, IADL, FMC, Edema, Coordination, Balance, Body mechanics, Decreased knowledge of precautions, Endurance, Pain, Sensation, UE functional use, GMC, Decreased knowledge of use of DME       Visit Diagnosis: Stiffness of left shoulder, not elsewhere classified  Stiffness of left elbow, not elsewhere classified  Other lack of coordination  Stiffness of left hand, not elsewhere classified  Chronic left shoulder pain  Pain in left wrist  Other disturbances of skin sensation  Muscle weakness (generalized)    Problem List Patient Active Problem List   Diagnosis Date Noted   CKD (chronic kidney disease), stage III with proteinuria (HCC) 12/11/2020   Swelling of left hand 12/11/2020   Type 2 diabetes mellitus with diabetic nephropathy, without long-term current use of insulin (HCC)    Acute on  chronic anemia    CVA (cerebral vascular accident) (HCC) 12/02/2020   Essential hypertension, benign 11/02/2018   Tobacco use disorder 11/02/2018   Bipolar disorder (HCC) 11/02/2018    Junious Dresser MOT, OTR/L  07/29/2021, 4:26 PM  [facility] [address] [place], [place], [address] Phone: [phone]   Fax:  [phone]  Name: Rodney Rose MRN: [id number] Date of Birth: [birth year]

## 2021-07-29 NOTE — Patient Instructions (Addendum)
   Self Passive Range of Motion   In sitting, clasp hands together and bend down towards toes for a stretch for your shoulder. Repeat 10 times and do 2-3 times a day.  In sitting, hold cane with both hands and slide across legs and down to toes for a stretch for your shoulder. Repeat 10 times and 2-3 times a day.   Self Passive Range of Motion   While lying down, clasp hands together and reach towards headboard for a stretch for your shoulder. Repeat 10 times and do 2-3 times a day.  While lying down, clasp hands together and reach side to side for a stretch for your shoulder. Repeat 10 times and do 2-3 times a day.  While lying down, clasp hands together and reach up towards the ceiling for a stretch for your shoulder. Repeat 10 times and do 2-3 times a day.   Basic Activities:   Use your affected hand to perform the following activities for 20-30 minutes 1-2 times/day.  Stop activity if you experience pain.  - Wipe table top - Bring plastic cup to mouth, then return to table top and release - Flip playing cards - Pick up cotton balls and place in a container - Toss ball - Rotate ball in your hand  - Slide checkers on tabletop - Turn doorknow - Open/close cabinet door with handle - Pick up coins and place in a container - Fold towels - Stack blocks - Pick up 1-inch blocks - Pick up a pencil - Put empty clothes hangers on a rack, remove, and repeat

## 2021-08-04 ENCOUNTER — Ambulatory Visit: Payer: BLUE CROSS/BLUE SHIELD | Admitting: Occupational Therapy

## 2021-08-04 ENCOUNTER — Other Ambulatory Visit: Payer: Self-pay

## 2021-08-04 ENCOUNTER — Encounter: Payer: Self-pay | Admitting: Occupational Therapy

## 2021-08-04 DIAGNOSIS — M25612 Stiffness of left shoulder, not elsewhere classified: Secondary | ICD-10-CM

## 2021-08-04 DIAGNOSIS — R600 Localized edema: Secondary | ICD-10-CM

## 2021-08-04 DIAGNOSIS — M25642 Stiffness of left hand, not elsewhere classified: Secondary | ICD-10-CM

## 2021-08-04 DIAGNOSIS — R208 Other disturbances of skin sensation: Secondary | ICD-10-CM

## 2021-08-04 DIAGNOSIS — G8929 Other chronic pain: Secondary | ICD-10-CM

## 2021-08-04 DIAGNOSIS — M25512 Pain in left shoulder: Secondary | ICD-10-CM

## 2021-08-04 DIAGNOSIS — M25622 Stiffness of left elbow, not elsewhere classified: Secondary | ICD-10-CM

## 2021-08-04 DIAGNOSIS — M25532 Pain in left wrist: Secondary | ICD-10-CM

## 2021-08-04 DIAGNOSIS — R278 Other lack of coordination: Secondary | ICD-10-CM

## 2021-08-04 DIAGNOSIS — M6281 Muscle weakness (generalized): Secondary | ICD-10-CM

## 2021-08-04 NOTE — Therapy (Signed)
[facility] [address] [place], [place], [address] Phone: [phone]   Fax:  [phone]  Occupational Therapy Treatment  Patient Details  Name: Rodney Rose MRN: [id number] Date of Birth: July 23, 1976 Referring Provider (OT): Dr Oswaldo Done   Encounter Date: 08/04/2021   OT End of Session - 08/04/21 1956     Visit Number 4    Number of Visits 17    Date for OT Re-Evaluation 09/27/21    Authorization Type [employer]    OT Start Time 1533    OT Stop Time 1615    OT Time Calculation (min) 42 min    Activity Tolerance Patient tolerated treatment well    Behavior During Therapy [facility] for tasks assessed/performed             Past Medical History:  Diagnosis Date   Bipolar disorder (HCC)    Dr. Georgia Lopes with [facility] Psychiatry   Chronic back pain    Hypertension 2017   Schizophrenia [facility])    Stroke (HCC) 11/2020    Past Surgical History:  Procedure Laterality Date   BUBBLE STUDY  12/05/2020   Procedure: BUBBLE STUDY;  Surgeon: Wendall Stade, MD;  Location: [facility] ENDOSCOPY;  Service: Cardiovascular;;   NO PAST SURGERIES  10/2018   TEE WITHOUT CARDIOVERSION N/A 12/05/2020   Procedure: TRANSESOPHAGEAL ECHOCARDIOGRAM (TEE);  Surgeon: Wendall Stade, MD;  Location: [facility] ENDOSCOPY;  Service: Cardiovascular;  Laterality: N/A;    There were no vitals filed for this visit.   Subjective Assessment - 08/04/21 1952     Subjective  Patient reports he is still having some wrist pain - notably after attempting to open a heavy door    Currently in Pain? Yes    Pain Score 3     Pain Location Wrist    Pain Orientation Left    Pain Descriptors / Indicators Aching    Pain Type Acute pain    Pain Onset 1 to 4 weeks ago    Pain Frequency Intermittent    Aggravating Factors  lifting    Pain Relieving Factors rest, splint                          OT Treatments/Exercises (OP) - 08/04/21 0001        Splinting   Splinting Fabricated a custom wrist orthosis following mobilization and alignment of carpals.  Patient instructed to wear one hour, and if no pressure points - could wear for 2 hours or more but not to wear 24 hr/day.      Manual Therapy   Joint Mobilization Carpal mobilization to help reduce pain and poor mechanics in wrist - especially with wrist extension.  Patient with palpable block dorsum mid carpals - responded well to mobilization and followed with improved passive and active wrist extension.                    OT Education - 08/04/21 1956     Education Details wearing schedule left wrist splint    Person(s) Educated Patient    Methods Explanation;Demonstration    Comprehension Verbalized understanding;Returned demonstration              OT Short Term Goals - 08/04/21 1958       OT SHORT TERM GOAL #1   Title Patient will complete an HEP designed to improve PROM to LUE    Baseline No HEP  Time 4    Period Weeks    Status On-going    Target Date 08/28/21      OT SHORT TERM GOAL #2   Title Patient will report no increase in pain in left arm with passive stretching, or HEP    Baseline Pain ranges up to 5-6/10    Time 4    Period Weeks    Status On-going      OT SHORT TERM GOAL #3   Title Patient will demonstrate increase of three blocks in box and blocks test    Baseline 14    Time 4    Period Weeks    Status On-going      OT SHORT TERM GOAL #4   Title Patient will demonstrate ability to stabilize a potato with left hand to peel with right hand    Baseline unable    Time 4    Period Weeks    Status On-going      OT SHORT TERM GOAL #5   Title Patient will be able to cut food on his plate without assistance    Baseline Unable    Time 4    Period Weeks    Status On-going               OT Long Term Goals - 07/28/21 1710       OT LONG TERM GOAL #1   Title Patient will complete updated HEP    Baseline No HEP    Time 8     Period Weeks    Status On-going      OT LONG TERM GOAL #2   Title Patient will utilize BUE to wash dishes x 5 min without dropping, or wrist pain    Baseline Unable    Time 8    Period Weeks    Status On-going      OT LONG TERM GOAL #3   Title Patient will tie shoes using BUE    Baseline unable    Time 8    Period Weeks    Status On-going      OT LONG TERM GOAL #4   Title Patient will demonstrate low level reach with LUE - shoulder flexion with elbow extension to grasp and release lightweight (less than 2lb) object    Baseline Unable to flex shoulder and extend elbow    Time 8    Period Weeks    Status On-going      OT LONG TERM GOAL #5   Title Patient will complete 9 hole peg test with LUE in less than 45 sec    Baseline 2 min 20 sec    Time 8    Period Weeks    Status On-going                   Plan - 08/04/21 1957     Clinical Impression Statement Pt continues with intermittent wrist pain and limited functional use of non dominant LUE    OT Occupational Profile and History Detailed Assessment- Review of Records and additional review of physical, cognitive, psychosocial history related to current functional performance    Occupational performance deficits (Please refer to evaluation for details): ADL's;IADL's;Work    Body Structure / Function / Physical Skills ADL;Dexterity;Flexibility;Muscle spasms;ROM;Strength;Tone;IADL;FMC;Edema;Coordination;Balance;Body mechanics;Decreased knowledge of precautions;Endurance;Pain;Sensation;UE functional use;GMC;Decreased knowledge of use of DME    Rehab Potential Good    Clinical Decision Making Several treatment options, min-mod task modification necessary    Comorbidities Affecting  Occupational Performance: May have comorbidities impacting occupational performance    Modification or Assistance to Complete Evaluation  No modification of tasks or assist necessary to complete eval    OT Frequency 2x / week    OT  Duration 8 weeks    OT Treatment/Interventions Self-care/ADL training;Cryotherapy;Therapeutic exercise;DME and/or AE instruction;Balance training;Functional Mobility Training;Aquatic Therapy;Electrical Stimulation;Ultrasound;Fluidtherapy;Neuromuscular education;Manual Therapy;Splinting;Visual/perceptual remediation/compensation;Moist Heat;Passive range of motion;Therapeutic activities;Patient/family education    Plan Continue to check HEP and wrist brace, add to HEP PRN, work on functional tasks with LUE and stabilizing to increasing use for IADLs (cooking, cleaning)    Consulted and Agree with Plan of Care Patient             Patient will benefit from skilled therapeutic intervention in order to improve the following deficits and impairments:   Body Structure / Function / Physical Skills: ADL, Dexterity, Flexibility, Muscle spasms, ROM, Strength, Tone, IADL, FMC, Edema, Coordination, Balance, Body mechanics, Decreased knowledge of precautions, Endurance, Pain, Sensation, UE functional use, GMC, Decreased knowledge of use of DME       Visit Diagnosis: Stiffness of left shoulder, not elsewhere classified  Stiffness of left elbow, not elsewhere classified  Other lack of coordination  Stiffness of left hand, not elsewhere classified  Chronic left shoulder pain  Pain in left wrist  Other disturbances of skin sensation  Muscle weakness (generalized)  Localized edema    Problem List Patient Active Problem List   Diagnosis Date Noted   CKD (chronic kidney disease), stage III with proteinuria (HCC) 12/11/2020   Swelling of left hand 12/11/2020   Type 2 diabetes mellitus with diabetic nephropathy, without long-term current use of insulin (HCC)    Acute on chronic anemia    CVA (cerebral vascular accident) (HCC) 12/02/2020   Essential hypertension, benign 11/02/2018   Tobacco use disorder 11/02/2018   Bipolar disorder (HCC) 11/02/2018    Collier Salina 08/04/2021, 7:59  PM  [place] [address] [place], [place], [address] Phone: [phone]   Fax:  [phone]  Name: Rodney Rose MRN: [id number] Date of Birth: [birth year]

## 2021-08-05 ENCOUNTER — Other Ambulatory Visit: Payer: Self-pay | Admitting: Internal Medicine

## 2021-08-05 ENCOUNTER — Ambulatory Visit: Payer: BLUE CROSS/BLUE SHIELD | Admitting: Occupational Therapy

## 2021-08-05 DIAGNOSIS — M25642 Stiffness of left hand, not elsewhere classified: Secondary | ICD-10-CM

## 2021-08-05 DIAGNOSIS — M25612 Stiffness of left shoulder, not elsewhere classified: Secondary | ICD-10-CM

## 2021-08-05 DIAGNOSIS — R600 Localized edema: Secondary | ICD-10-CM

## 2021-08-05 DIAGNOSIS — M25532 Pain in left wrist: Secondary | ICD-10-CM

## 2021-08-05 DIAGNOSIS — M25622 Stiffness of left elbow, not elsewhere classified: Secondary | ICD-10-CM

## 2021-08-05 DIAGNOSIS — E119 Type 2 diabetes mellitus without complications: Secondary | ICD-10-CM

## 2021-08-05 DIAGNOSIS — R208 Other disturbances of skin sensation: Secondary | ICD-10-CM

## 2021-08-05 DIAGNOSIS — M6281 Muscle weakness (generalized): Secondary | ICD-10-CM

## 2021-08-05 DIAGNOSIS — G8929 Other chronic pain: Secondary | ICD-10-CM

## 2021-08-05 DIAGNOSIS — R278 Other lack of coordination: Secondary | ICD-10-CM

## 2021-08-05 NOTE — Therapy (Signed)
[facility] [address] [place], [place], [address] Phone: [phone]   Fax:  [phone]  Occupational Therapy Treatment  Patient Details  Name: Rodney Rose MRN: [id number] Date of Birth: 10-Dec-1976 Referring Provider (OT): Dr Oswaldo Done   Encounter Date: 08/05/2021   OT End of Session - 08/05/21 1819     Visit Number 5    Number of Visits 17    Date for OT Re-Evaluation 09/27/21    Authorization Type [employer]    OT Start Time 1620    OT Stop Time 1700    OT Time Calculation (min) 40 min    Activity Tolerance Patient tolerated treatment well    Behavior During Therapy [facility] for tasks assessed/performed             Past Medical History:  Diagnosis Date   Bipolar disorder (HCC)    Dr. Georgia Lopes with [facility] Psychiatry   Chronic back pain    Hypertension 2017   Schizophrenia [facility])    Stroke (HCC) 11/2020    Past Surgical History:  Procedure Laterality Date   BUBBLE STUDY  12/05/2020   Procedure: BUBBLE STUDY;  Surgeon: Wendall Stade, MD;  Location: [facility] ENDOSCOPY;  Service: Cardiovascular;;   NO PAST SURGERIES  10/2018   TEE WITHOUT CARDIOVERSION N/A 12/05/2020   Procedure: TRANSESOPHAGEAL ECHOCARDIOGRAM (TEE);  Surgeon: Wendall Stade, MD;  Location: [facility] ENDOSCOPY;  Service: Cardiovascular;  Laterality: N/A;    There were no vitals filed for this visit.   Subjective Assessment - 08/05/21 1816     Subjective  Patient reports wrist pain better - "not bothering me"  Patient indicates wrist brace feels secure.    Currently in Pain? No/denies    Pain Score 0-No pain                          OT Treatments/Exercises (OP) - 08/05/21 0001       Neurological Re-education Exercises   Other Exercises 1 Supine AAROM exercises for left shoulder and elbow.  Working to increase end range and staying in pain free ranges.  Worked in prone on elbows, transitioned to quadruped with  modified left hand position to accomodate less wrist extension.  Worked on scapular stability and activation.  Worked on body on arm stretching in quadruped.  Modified plank position with forearms on elevated surface.  Followed with active assited exercise program - see patient instructions.                    OT Education - 08/05/21 1819     Education Details HEP Active assisted movement of LUE    Person(s) Educated Patient    Methods Explanation;Demonstration;Tactile cues;Verbal cues;Handout    Comprehension Verbalized understanding;Returned demonstration              OT Short Term Goals - 08/05/21 1821       OT SHORT TERM GOAL #1   Title Patient will complete an HEP designed to improve PROM to LUE    Baseline No HEP    Time 4    Period Weeks    Status On-going    Target Date 08/28/21      OT SHORT TERM GOAL #2   Title Patient will report no increase in pain in left arm with passive stretching, or HEP    Baseline Pain ranges up to 5-6/10    Time 4  Period Weeks    Status On-going      OT SHORT TERM GOAL #3   Title Patient will demonstrate increase of three blocks in box and blocks test    Baseline 14    Time 4    Period Weeks    Status On-going      OT SHORT TERM GOAL #4   Title Patient will demonstrate ability to stabilize a potato with left hand to peel with right hand    Baseline unable    Time 4    Period Weeks    Status On-going      OT SHORT TERM GOAL #5   Title Patient will be able to cut food on his plate without assistance    Baseline Unable    Time 4    Period Weeks    Status On-going               OT Long Term Goals - 08/05/21 1821       OT LONG TERM GOAL #1   Title Patient will complete updated HEP    Baseline No HEP    Time 8    Period Weeks    Status On-going      OT LONG TERM GOAL #2   Title Patient will utilize BUE to wash dishes x 5 min without dropping, or wrist pain    Baseline Unable    Time 8    Period  Weeks    Status On-going      OT LONG TERM GOAL #3   Title Patient will tie shoes using BUE    Baseline unable    Time 8    Period Weeks    Status On-going      OT LONG TERM GOAL #4   Title Patient will demonstrate low level reach with LUE - shoulder flexion with elbow extension to grasp and release lightweight (less than 2lb) object    Baseline Unable to flex shoulder and extend elbow    Time 8    Period Weeks    Status On-going      OT LONG TERM GOAL #5   Title Patient will complete 9 hole peg test with LUE in less than 45 sec    Baseline 2 min 20 sec    Time 8    Period Weeks    Status On-going                   Plan - 08/05/21 1820     Clinical Impression Statement Pt showing slow but steady improvement with LUE active movement.  Need to incorporate movement safely into functional activities.    OT Occupational Profile and History Detailed Assessment- Review of Records and additional review of physical, cognitive, psychosocial history related to current functional performance    Occupational performance deficits (Please refer to evaluation for details): ADL's;IADL's;Work    Body Structure / Function / Physical Skills ADL;Dexterity;Flexibility;Muscle spasms;ROM;Strength;Tone;IADL;FMC;Edema;Coordination;Balance;Body mechanics;Decreased knowledge of precautions;Endurance;Pain;Sensation;UE functional use;GMC;Decreased knowledge of use of DME    Rehab Potential Good    Clinical Decision Making Several treatment options, min-mod task modification necessary    Comorbidities Affecting Occupational Performance: May have comorbidities impacting occupational performance    Modification or Assistance to Complete Evaluation  No modification of tasks or assist necessary to complete eval    OT Frequency 2x / week    OT Duration 8 weeks    OT Treatment/Interventions Self-care/ADL training;Cryotherapy;Therapeutic exercise;DME and/or AE instruction;Balance training;Functional  Mobility Training;Aquatic  Therapy;Electrical Stimulation;Ultrasound;Fluidtherapy;Neuromuscular education;Manual Therapy;Splinting;Visual/perceptual remediation/compensation;Moist Heat;Passive range of motion;Therapeutic activities;Patient/family education    Plan NMR  LUE, work on functional tasks with LUE and stabilizing to increasing use for IADLs (cooking, cleaning)    Consulted and Agree with Plan of Care Patient             Patient will benefit from skilled therapeutic intervention in order to improve the following deficits and impairments:   Body Structure / Function / Physical Skills: ADL, Dexterity, Flexibility, Muscle spasms, ROM, Strength, Tone, IADL, FMC, Edema, Coordination, Balance, Body mechanics, Decreased knowledge of precautions, Endurance, Pain, Sensation, UE functional use, GMC, Decreased knowledge of use of DME       Visit Diagnosis: Stiffness of left shoulder, not elsewhere classified  Stiffness of left elbow, not elsewhere classified  Other lack of coordination  Stiffness of left hand, not elsewhere classified  Chronic left shoulder pain  Pain in left wrist  Other disturbances of skin sensation  Muscle weakness (generalized)  Localized edema    Problem List Patient Active Problem List   Diagnosis Date Noted   CKD (chronic kidney disease), stage III with proteinuria (HCC) 12/11/2020   Swelling of left hand 12/11/2020   Type 2 diabetes mellitus with diabetic nephropathy, without long-term current use of insulin (HCC)    Acute on chronic anemia    CVA (cerebral vascular accident) (HCC) 12/02/2020   Essential hypertension, benign 11/02/2018   Tobacco use disorder 11/02/2018   Bipolar disorder (HCC) 11/02/2018    Collier Salina 08/05/2021, 6:22 PM  [facility] [address] [place], [place], [address] Phone: [phone]   Fax:  [phone]  Name: RAWLINS STUARD MRN:  [id number] Date of Birth: 12/22/1976

## 2021-08-05 NOTE — Patient Instructions (Signed)
Shoulder and elbow exercises  Seated on bed holding a broom upside down with the end on the floor beside your left foot.  Hold the broom in the middle.    1)  Start with elbow bent, press the broom forward until elbow is straight, then while sitting up tall - start leaning forward to stretch shoulder and elbow.   Lean until muscles quiver.  Repeat 10 times.    2)  Same position leaning forward, bend and straighten your elbow 10 times.    3)  Sit up tall, holding broom with elbow straight - move broom to left away from you until you feel a stretch.    4)  Sit up tall - move broom in circles - clockwise 5 times then counterclockwise.

## 2021-08-11 ENCOUNTER — Encounter: Payer: Self-pay | Admitting: Occupational Therapy

## 2021-08-11 ENCOUNTER — Other Ambulatory Visit: Payer: Self-pay

## 2021-08-11 ENCOUNTER — Ambulatory Visit: Payer: BLUE CROSS/BLUE SHIELD | Admitting: Occupational Therapy

## 2021-08-11 DIAGNOSIS — M6281 Muscle weakness (generalized): Secondary | ICD-10-CM

## 2021-08-11 DIAGNOSIS — M25512 Pain in left shoulder: Secondary | ICD-10-CM

## 2021-08-11 DIAGNOSIS — M25612 Stiffness of left shoulder, not elsewhere classified: Secondary | ICD-10-CM

## 2021-08-11 DIAGNOSIS — M25642 Stiffness of left hand, not elsewhere classified: Secondary | ICD-10-CM

## 2021-08-11 DIAGNOSIS — R600 Localized edema: Secondary | ICD-10-CM

## 2021-08-11 DIAGNOSIS — G8929 Other chronic pain: Secondary | ICD-10-CM

## 2021-08-11 DIAGNOSIS — R278 Other lack of coordination: Secondary | ICD-10-CM

## 2021-08-11 DIAGNOSIS — M25622 Stiffness of left elbow, not elsewhere classified: Secondary | ICD-10-CM

## 2021-08-11 DIAGNOSIS — M25532 Pain in left wrist: Secondary | ICD-10-CM

## 2021-08-11 NOTE — Therapy (Signed)
[facility] [address] [place], [place], [address] Phone: [phone]   Fax:  [phone]  Occupational Therapy Treatment  Patient Details  Name: Rodney Rose MRN: [id number] Date of Birth: 31-Dec-1975 Referring Provider (OT): Dr Evette Doffing   Encounter Date: 08/11/2021   OT End of Session - 08/11/21 1856     Visit Number 6    Number of Visits 17    Date for OT Re-Evaluation 09/27/21    Authorization Type [employer] of [employer]    OT Start Time 1705    OT Stop Time 1745    OT Time Calculation (min) 40 min    Activity Tolerance Patient tolerated treatment well    Behavior During Therapy [facility] for tasks assessed/performed             Past Medical History:  Diagnosis Date   Bipolar disorder ([place])    Dr. Heriberto Antigua with [facility] Psychiatry   Chronic back pain    Hypertension 2017   Schizophrenia [facility])    Stroke ([place]) 11/2020    Past Surgical History:  Procedure Laterality Date   BUBBLE STUDY  12/05/2020   Procedure: BUBBLE STUDY;  Surgeon: Josue Hector, MD;  Location: [place];  Service: Cardiovascular;;   NO PAST SURGERIES  10/2018   TEE WITHOUT CARDIOVERSION N/A 12/05/2020   Procedure: TRANSESOPHAGEAL ECHOCARDIOGRAM (TEE);  Surgeon: Josue Hector, MD;  Location: [facility] ENDOSCOPY;  Service: Cardiovascular;  Laterality: N/A;    There were no vitals filed for this visit.   Subjective Assessment - 08/11/21 1706     Subjective  wrist hurts sometimes - not hurting now    Currently in Pain? No/denies    Pain Score 0-No pain                [facility] OT Assessment - 08/11/21 0001       Coordination   Box and Blocks 19                      OT Treatments/Exercises (OP) - 08/11/21 0001       ADLs   ADL Comments Began discussing short term goals.  Patient has met 2 STG's at this time      Neurological Re-education Exercises   Other Exercises 1 Patient completed box and blocks with  improved score (by 5 blocks)  Patient lacks wrist extension with picking up 1in blocks.  Patient able to actively extend wrist with cueing and light facilitation.  With repetition better able to access wrist extension.  Unable to pinch 1in blocks and extend wrist.  Used estim NMR to address consistency of wrist extensor contraction, and use during pinching activity.  Followed with pinch and lift tol stack blocks without external stim.    Other Exercises 2 Supine to address shoulder and elbow coordiantion as needed for mid level reach. Patient with increased tension in internal rotators/adductors of [place] joinmt, and flexors of elbow - leading to painful conditions at end ranges.  Sent message to patients MD, with patient's consent-  regarding potential for use of medication to control muscle tension                      OT Short Term Goals - 08/11/21 1859       OT SHORT TERM GOAL #1   Title Patient will complete an HEP designed to improve PROM to LUE    Baseline No HEP    Time  4    Period Weeks    Status On-going    Target Date 08/28/21      OT SHORT TERM GOAL #2   Title Patient will report no increase in pain in left arm with passive stretching, or HEP    Baseline Pain ranges up to 5-6/10    Time 4    Period Weeks    Status Achieved      OT SHORT TERM GOAL #3   Title Patient will demonstrate increase of three blocks in box and blocks test    Baseline 14    Time 4    Period Weeks    Status Achieved   19     OT SHORT TERM GOAL #4   Title Patient will demonstrate ability to stabilize a potato with left hand to peel with right hand    Baseline unable    Time 4    Period Weeks    Status On-going      OT SHORT TERM GOAL #5   Title Patient will be able to cut food on his plate without assistance    Baseline Unable    Time 4    Period Weeks    Status On-going               OT Long Term Goals - 08/11/21 1859       OT LONG TERM GOAL #1   Title Patient will  complete updated HEP    Baseline No HEP    Time 8    Period Weeks    Status On-going      OT LONG TERM GOAL #2   Title Patient will utilize BUE to wash dishes x 5 min without dropping, or wrist pain    Baseline Unable    Time 8    Period Weeks    Status On-going      OT LONG TERM GOAL #3   Title Patient will tie shoes using BUE    Baseline unable    Time 8    Period Weeks    Status On-going      OT LONG TERM GOAL #4   Title Patient will demonstrate low level reach with LUE - shoulder flexion with elbow extension to grasp and release lightweight (less than 2lb) object    Baseline Unable to flex shoulder and extend elbow    Time 8    Period Weeks    Status On-going      OT LONG TERM GOAL #5   Title Patient will complete 9 hole peg test with LUE in less than 74mn 45 sec    Baseline 2 min 20 sec    Time 8    Period Weeks    Status On-going                   Plan - 08/11/21 1857     Clinical Impression Statement Pt showing some improvement in his ability to isolate muscle activity for exercise - more challenging to incorporate into functional activity at this point.    OT Occupational Profile and History Detailed Assessment- Review of Records and additional review of physical, cognitive, psychosocial history related to current functional performance    Occupational performance deficits (Please refer to evaluation for details): ADL's;IADL's;Work    Body Structure / Function / Physical Skills ADL;Dexterity;Flexibility;Muscle spasms;ROM;Strength;Tone;IADL;FMC;Edema;Coordination;Balance;Body mechanics;Decreased knowledge of precautions;Endurance;Pain;Sensation;UE functional use;GMC;Decreased knowledge of use of DME    Rehab Potential Good    Clinical Decision Making Several  treatment options, min-mod task modification necessary    Comorbidities Affecting Occupational Performance: May have comorbidities impacting occupational performance    Modification or Assistance to  Complete Evaluation  No modification of tasks or assist necessary to complete eval    OT Frequency 2x / week    OT Duration 8 weeks    OT Treatment/Interventions Self-care/ADL training;Cryotherapy;Therapeutic exercise;DME and/or AE instruction;Balance training;Functional Mobility Training;Aquatic Therapy;Electrical Stimulation;Ultrasound;Fluidtherapy;Neuromuscular education;Manual Therapy;Splinting;Visual/perceptual remediation/compensation;Moist Heat;Passive range of motion;Therapeutic activities;Patient/family education    Plan repair and re-assess wrist brace, NMR  LUE, work on functional tasks with LUE and stabilizing to increasing use for IADLs (cooking, cleaning)    Consulted and Agree with Plan of Care Patient             Patient will benefit from skilled therapeutic intervention in order to improve the following deficits and impairments:   Body Structure / Function / Physical Skills: ADL, Dexterity, Flexibility, Muscle spasms, ROM, Strength, Tone, IADL, FMC, Edema, Coordination, Balance, Body mechanics, Decreased knowledge of precautions, Endurance, Pain, Sensation, UE functional use, GMC, Decreased knowledge of use of DME       Visit Diagnosis: Stiffness of left shoulder, not elsewhere classified  Stiffness of left elbow, not elsewhere classified  Other lack of coordination  Stiffness of left hand, not elsewhere classified  Chronic left shoulder pain  Pain in left wrist  Muscle weakness (generalized)  Localized edema    Problem List Patient Active Problem List   Diagnosis Date Noted   CKD (chronic kidney disease), stage III with proteinuria ([place]) 12/11/2020   Swelling of left hand 12/11/2020   Type 2 diabetes mellitus with diabetic nephropathy, without long-term current use of insulin (HCC)    Acute on chronic anemia    CVA (cerebral vascular accident) ([place]) 12/02/2020   Essential hypertension, benign 11/02/2018   Tobacco use disorder 11/02/2018   Bipolar  disorder ([place]) 11/02/2018    Mariah Milling 08/11/2021, 7:00 PM  [place] [address] [place], [place], [address] Phone: [phone]   Fax:  [phone]  Name: Rodney Rose MRN: [id number] Date of Birth: 1976-08-05

## 2021-08-12 ENCOUNTER — Ambulatory Visit: Payer: BLUE CROSS/BLUE SHIELD | Admitting: Occupational Therapy

## 2021-08-12 ENCOUNTER — Encounter: Payer: Self-pay | Admitting: Occupational Therapy

## 2021-08-12 DIAGNOSIS — M25622 Stiffness of left elbow, not elsewhere classified: Secondary | ICD-10-CM

## 2021-08-12 DIAGNOSIS — M6281 Muscle weakness (generalized): Secondary | ICD-10-CM

## 2021-08-12 DIAGNOSIS — M25612 Stiffness of left shoulder, not elsewhere classified: Secondary | ICD-10-CM

## 2021-08-12 DIAGNOSIS — M25642 Stiffness of left hand, not elsewhere classified: Secondary | ICD-10-CM

## 2021-08-12 DIAGNOSIS — M25512 Pain in left shoulder: Secondary | ICD-10-CM

## 2021-08-12 DIAGNOSIS — R278 Other lack of coordination: Secondary | ICD-10-CM

## 2021-08-12 DIAGNOSIS — M25532 Pain in left wrist: Secondary | ICD-10-CM

## 2021-08-12 DIAGNOSIS — G8929 Other chronic pain: Secondary | ICD-10-CM

## 2021-08-12 NOTE — Therapy (Signed)
[facility] [address] [place], [place], [address] Phone: [phone]   Fax:  [phone]  Occupational Therapy Treatment  Patient Details  Name: Rodney Rose MRN: [id number] Date of Birth: 23-Aug-1976 Referring Provider (OT): Dr Oswaldo Done   Encounter Date: 08/12/2021   OT End of Session - 08/12/21 1618     Visit Number 7    Number of Visits 17    Date for OT Re-Evaluation 09/27/21    Authorization Type [employer] WF-Out of Network    OT Start Time 1618    OT Stop Time 1700    OT Time Calculation (min) 42 min    Activity Tolerance Patient tolerated treatment well    Behavior During Therapy [facility] for tasks assessed/performed             Past Medical History:  Diagnosis Date   Bipolar disorder (HCC)    Dr. Georgia Lopes with [facility] Psychiatry   Chronic back pain    Hypertension 2017   Schizophrenia [facility])    Stroke (HCC) 11/2020    Past Surgical History:  Procedure Laterality Date   BUBBLE STUDY  12/05/2020   Procedure: BUBBLE STUDY;  Surgeon: Wendall Stade, MD;  Location: [facility] ENDOSCOPY;  Service: Cardiovascular;;   NO PAST SURGERIES  10/2018   TEE WITHOUT CARDIOVERSION N/A 12/05/2020   Procedure: TRANSESOPHAGEAL ECHOCARDIOGRAM (TEE);  Surgeon: Wendall Stade, MD;  Location: [facility] ENDOSCOPY;  Service: Cardiovascular;  Laterality: N/A;    There were no vitals filed for this visit.   Subjective Assessment - 08/12/21 1618     Subjective  Pt reports no pain.    Currently in Pain? No/denies                          OT Treatments/Exercises (OP) - 08/12/21 0001       ADLs   Work Discussed working and what patient plans on doing. Pt has filled out application for [employer]. 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 Discussed that if he could start light with the job and gradual get  into the heavier duties that would be better. Pt had asked about buffer machine.      Exercises   Exercises Hand      Neurological Re-education Exercises   Scapular Stabilization Seated    Other Information with tactile facilitation for scap retraction and downward rotation to encourage increased scap stabiiization for reaching    Other Exercises 1 UE ranger while seated edge of mat with forward reaching, horizontal abduction with good control. Pt with verbal cues for maintaining neutral wrist without orthosis on.      Splinting   Splinting Pt benefitting from wrist orthosis - custom made- OT adjusted velcro and strap for increased benefit and continued wear      Fine Motor Coordination (Hand/Wrist)   Fine Motor Coordination Flipping cards;Large Pegboard    Large Pegboard SEMI CIRCLE PEGBOARD with LUE and wearing custom made wrist orthosis - Pt with great coordination with white dowels and red dowels. Pt able to manipulate gray dowels with mod difficulty    Flipping cards flipping jumbo cards with RUE. Pt able to flip cards with mod dificulty without orthosis and with min difficulty with wrist orthosis. Pt with better coordination and control with use of custom made wrist orthosis.  OT Short Term Goals - 08/11/21 1859       OT SHORT TERM GOAL #1   Title Patient will complete an HEP designed to improve PROM to LUE    Baseline No HEP    Time 4    Period Weeks    Status On-going    Target Date 08/28/21      OT SHORT TERM GOAL #2   Title Patient will report no increase in pain in left arm with passive stretching, or HEP    Baseline Pain ranges up to 5-6/10    Time 4    Period Weeks    Status Achieved      OT SHORT TERM GOAL #3   Title Patient will demonstrate increase of three blocks in box and blocks test    Baseline 14    Time 4    Period Weeks    Status Achieved   19     OT SHORT TERM GOAL #4   Title Patient will demonstrate ability to  stabilize a potato with left hand to peel with right hand    Baseline unable    Time 4    Period Weeks    Status On-going      OT SHORT TERM GOAL #5   Title Patient will be able to cut food on his plate without assistance    Baseline Unable    Time 4    Period Weeks    Status On-going               OT Long Term Goals - 08/11/21 1859       OT LONG TERM GOAL #1   Title Patient will complete updated HEP    Baseline No HEP    Time 8    Period Weeks    Status On-going      OT LONG TERM GOAL #2   Title Patient will utilize BUE to wash dishes x 5 min without dropping, or wrist pain    Baseline Unable    Time 8    Period Weeks    Status On-going      OT LONG TERM GOAL #3   Title Patient will tie shoes using BUE    Baseline unable    Time 8    Period Weeks    Status On-going      OT LONG TERM GOAL #4   Title Patient will demonstrate low level reach with LUE - shoulder flexion with elbow extension to grasp and release lightweight (less than 2lb) object    Baseline Unable to flex shoulder and extend elbow    Time 8    Period Weeks    Status On-going      OT LONG TERM GOAL #5   Title Patient will complete 9 hole peg test with LUE in less than 45 sec    Baseline 2 min 20 sec    Time 8    Period Weeks    Status On-going                   Plan - 08/12/21 1725     Clinical Impression Statement Pt with improved coordination this day noted with various sized dowels/pegs. Pt benefiting from orthosis for maintaining wrist positioning for using LUE for functional coordination tasks.    OT Occupational Profile and History Detailed Assessment- Review of Records and additional review of physical, cognitive, psychosocial history related to current functional performance    Occupational performance  deficits (Please refer to evaluation for details): ADL's;IADL's;Work    Body Structure / Function / Physical Skills ADL;Dexterity;Flexibility;Muscle  spasms;ROM;Strength;Tone;IADL;FMC;Edema;Coordination;Balance;Body mechanics;Decreased knowledge of precautions;Endurance;Pain;Sensation;UE functional use;GMC;Decreased knowledge of use of DME    Rehab Potential Good    Clinical Decision Making Several treatment options, min-mod task modification necessary    Comorbidities Affecting Occupational Performance: May have comorbidities impacting occupational performance    Modification or Assistance to Complete Evaluation  No modification of tasks or assist necessary to complete eval    OT Frequency 2x / week    OT Duration 8 weeks    OT Treatment/Interventions Self-care/ADL training;Cryotherapy;Therapeutic exercise;DME and/or AE instruction;Balance training;Functional Mobility Training;Aquatic Therapy;Electrical Stimulation;Ultrasound;Fluidtherapy;Neuromuscular education;Manual Therapy;Splinting;Visual/perceptual remediation/compensation;Moist Heat;Passive range of motion;Therapeutic activities;Patient/family education    Plan NMR  LUE, work on functional tasks with LUE and stabilizing to increasing use for IADLs (cooking, cleaning)    Consulted and Agree with Plan of Care Patient             Patient will benefit from skilled therapeutic intervention in order to improve the following deficits and impairments:   Body Structure / Function / Physical Skills: ADL, Dexterity, Flexibility, Muscle spasms, ROM, Strength, Tone, IADL, FMC, Edema, Coordination, Balance, Body mechanics, Decreased knowledge of precautions, Endurance, Pain, Sensation, UE functional use, GMC, Decreased knowledge of use of DME       Visit Diagnosis: Stiffness of left shoulder, not elsewhere classified  Stiffness of left elbow, not elsewhere classified  Stiffness of left hand, not elsewhere classified  Other lack of coordination  Chronic left shoulder pain  Pain in left wrist  Muscle weakness (generalized)    Problem List Patient Active Problem List   Diagnosis  Date Noted   CKD (chronic kidney disease), stage III with proteinuria (HCC) 12/11/2020   Swelling of left hand 12/11/2020   Type 2 diabetes mellitus with diabetic nephropathy, without long-term current use of insulin (HCC)    Acute on chronic anemia    CVA (cerebral vascular accident) (HCC) 12/02/2020   Essential hypertension, benign 11/02/2018   Tobacco use disorder 11/02/2018   Bipolar disorder (HCC) 11/02/2018    Junious Dresser MOT, OTR/L  08/12/2021, 5:26 PM  [facility] [address] [place], [place], [address] Phone: [phone]   Fax:  [phone]  Name: Rodney Rose MRN: [id number] Date of Birth: 05-Apr-1976

## 2021-08-18 ENCOUNTER — Ambulatory Visit: Payer: BLUE CROSS/BLUE SHIELD | Admitting: Occupational Therapy

## 2021-08-18 ENCOUNTER — Encounter: Payer: Self-pay | Admitting: Occupational Therapy

## 2021-08-18 ENCOUNTER — Other Ambulatory Visit: Payer: Self-pay

## 2021-08-18 DIAGNOSIS — R278 Other lack of coordination: Secondary | ICD-10-CM

## 2021-08-18 DIAGNOSIS — R208 Other disturbances of skin sensation: Secondary | ICD-10-CM

## 2021-08-18 DIAGNOSIS — R600 Localized edema: Secondary | ICD-10-CM

## 2021-08-18 DIAGNOSIS — M25622 Stiffness of left elbow, not elsewhere classified: Secondary | ICD-10-CM

## 2021-08-18 DIAGNOSIS — M25642 Stiffness of left hand, not elsewhere classified: Secondary | ICD-10-CM

## 2021-08-18 DIAGNOSIS — M25612 Stiffness of left shoulder, not elsewhere classified: Secondary | ICD-10-CM

## 2021-08-18 DIAGNOSIS — G8929 Other chronic pain: Secondary | ICD-10-CM

## 2021-08-18 DIAGNOSIS — M25512 Pain in left shoulder: Secondary | ICD-10-CM

## 2021-08-18 DIAGNOSIS — M25532 Pain in left wrist: Secondary | ICD-10-CM

## 2021-08-18 NOTE — Therapy (Signed)
[facility] [address] [place], [place], [address] Phone: [phone]   Fax:  [phone]  Occupational Therapy Treatment  Patient Details  Name: Rodney Rose MRN: [id number] Date of Birth: 12-05-1976 Referring Provider (OT): Dr Oswaldo Done   Encounter Date: 08/18/2021   OT End of Session - 08/18/21 1902     Visit Number 8    Number of Visits 17    Date for OT Re-Evaluation 09/27/21    Authorization Type [employer] WF-Out of Network    OT Start Time 1625    OT Stop Time 1700    OT Time Calculation (min) 35 min    Activity Tolerance Patient tolerated treatment well    Behavior During Therapy [facility] for tasks assessed/performed             Past Medical History:  Diagnosis Date   Bipolar disorder (HCC)    Dr. Georgia Lopes with [facility] Psychiatry   Chronic back pain    Hypertension 2017   Schizophrenia [facility])    Stroke (HCC) 11/2020    Past Surgical History:  Procedure Laterality Date   BUBBLE STUDY  12/05/2020   Procedure: BUBBLE STUDY;  Surgeon: Wendall Stade, MD;  Location: [facility] ENDOSCOPY;  Service: Cardiovascular;;   NO PAST SURGERIES  10/2018   TEE WITHOUT CARDIOVERSION N/A 12/05/2020   Procedure: TRANSESOPHAGEAL ECHOCARDIOGRAM (TEE);  Surgeon: Wendall Stade, MD;  Location: [facility] ENDOSCOPY;  Service: Cardiovascular;  Laterality: N/A;    There were no vitals filed for this visit.   Subjective Assessment - 08/18/21 1635     Subjective  I opened the door with my hand!    Currently in Pain? Yes    Pain Score 2     Pain Location Wrist    Pain Orientation Left    Pain Descriptors / Indicators Aching    Pain Type Chronic pain    Pain Onset 1 to 4 weeks ago    Pain Frequency Intermittent    Aggravating Factors  end range    Pain Relieving Factors rest, splint                          OT Treatments/Exercises (OP) - 08/18/21 0001       Neurological Re-education Exercises   Other Exercises 1  Working on distal UE isolated control - supination/pronation, wrist extension, digit extension/flexion.  Patient needs further practice with combined movements as needed for function.      Manual Therapy   Joint Mobilization Carpal mobilization to help reduce pain and poor mechanics in wrist - especially with wrist extension.  Patient with palpable block dorsum mid carpals - responded well to mobilization and followed with improved passive and active wrist extension.  Patient reports no pain in wrist splint.  Patient with very unstable wrist joint.                      OT Short Term Goals - 08/18/21 1904       OT SHORT TERM GOAL #1   Title Patient will complete an HEP designed to improve PROM to LUE    Baseline No HEP    Time 4    Period Weeks    Status Achieved    Target Date 08/28/21      OT SHORT TERM GOAL #2   Title Patient will report no increase in pain in left arm with passive stretching, or HEP  Baseline Pain ranges up to 5-6/10    Time 4    Period Weeks    Status Achieved      OT SHORT TERM GOAL #3   Title Patient will demonstrate increase of three blocks in box and blocks test    Baseline 14    Time 4    Period Weeks    Status Achieved   19     OT SHORT TERM GOAL #4   Title Patient will demonstrate ability to stabilize a potato with left hand to peel with right hand    Baseline unable    Time 4    Period Weeks    Status On-going      OT SHORT TERM GOAL #5   Title Patient will be able to cut food on his plate without assistance    Baseline Unable    Time 4    Period Weeks    Status On-going               OT Long Term Goals - 08/18/21 1904       OT LONG TERM GOAL #1   Title Patient will complete updated HEP    Baseline No HEP    Time 8    Period Weeks    Status On-going      OT LONG TERM GOAL #2   Title Patient will utilize BUE to wash dishes x 5 min without dropping, or wrist pain    Baseline Unable    Time 8    Period Weeks     Status On-going      OT LONG TERM GOAL #3   Title Patient will tie shoes using BUE    Baseline unable    Time 8    Period Weeks    Status On-going      OT LONG TERM GOAL #4   Title Patient will demonstrate low level reach with LUE - shoulder flexion with elbow extension to grasp and release lightweight (less than 2lb) object    Baseline Unable to flex shoulder and extend elbow    Time 8    Period Weeks    Status On-going      OT LONG TERM GOAL #5   Title Patient will complete 9 hole peg test with LUE in less than 45 sec    Baseline 2 min 20 sec    Time 8    Period Weeks    Status On-going                   Plan - 08/18/21 1903     Clinical Impression Statement Patient showing improved isolated wrist movement, although still challenging to use functioanlly.    OT Occupational Profile and History Detailed Assessment- Review of Records and additional review of physical, cognitive, psychosocial history related to current functional performance    Occupational performance deficits (Please refer to evaluation for details): ADL's;IADL's;Work    Body Structure / Function / Physical Skills ADL;Dexterity;Flexibility;Muscle spasms;ROM;Strength;Tone;IADL;FMC;Edema;Coordination;Balance;Body mechanics;Decreased knowledge of precautions;Endurance;Pain;Sensation;UE functional use;GMC;Decreased knowledge of use of DME    Rehab Potential Good    Clinical Decision Making Several treatment options, min-mod task modification necessary    Comorbidities Affecting Occupational Performance: May have comorbidities impacting occupational performance    Modification or Assistance to Complete Evaluation  No modification of tasks or assist necessary to complete eval    OT Frequency 2x / week    OT Duration 8 weeks    OT Treatment/Interventions Self-care/ADL  training;Cryotherapy;Therapeutic exercise;DME and/or AE instruction;Balance training;Functional Mobility Training;Aquatic  Therapy;Electrical Stimulation;Ultrasound;Fluidtherapy;Neuromuscular education;Manual Therapy;Splinting;Visual/perceptual remediation/compensation;Moist Heat;Passive range of motion;Therapeutic activities;Patient/family education    Plan NMR  LUE, work on functional tasks with LUE and stabilizing to increasing use for IADLs (cooking, cleaning)    Consulted and Agree with Plan of Care Patient             Patient will benefit from skilled therapeutic intervention in order to improve the following deficits and impairments:   Body Structure / Function / Physical Skills: ADL, Dexterity, Flexibility, Muscle spasms, ROM, Strength, Tone, IADL, FMC, Edema, Coordination, Balance, Body mechanics, Decreased knowledge of precautions, Endurance, Pain, Sensation, UE functional use, GMC, Decreased knowledge of use of DME       Visit Diagnosis: Stiffness of left shoulder, not elsewhere classified  Stiffness of left elbow, not elsewhere classified  Stiffness of left hand, not elsewhere classified  Other lack of coordination  Chronic left shoulder pain  Pain in left wrist  Localized edema  Other disturbances of skin sensation    Problem List Patient Active Problem List   Diagnosis Date Noted   CKD (chronic kidney disease), stage III with proteinuria (HCC) 12/11/2020   Swelling of left hand 12/11/2020   Type 2 diabetes mellitus with diabetic nephropathy, without long-term current use of insulin (HCC)    Acute on chronic anemia    CVA (cerebral vascular accident) (HCC) 12/02/2020   Essential hypertension, benign 11/02/2018   Tobacco use disorder 11/02/2018   Bipolar disorder (HCC) 11/02/2018    Collier Salina 08/18/2021, 7:05 PM  [facility] [address] [place], [place], [address] Phone: [phone]   Fax:  [phone]  Name: SHANNON KIRKENDALL MRN: [id number] Date of Birth: May 08, 1976

## 2021-08-19 ENCOUNTER — Ambulatory Visit: Payer: BLUE CROSS/BLUE SHIELD | Admitting: Occupational Therapy

## 2021-08-25 ENCOUNTER — Ambulatory Visit: Payer: BLUE CROSS/BLUE SHIELD | Admitting: Occupational Therapy

## 2021-08-25 ENCOUNTER — Other Ambulatory Visit: Payer: Self-pay

## 2021-08-25 DIAGNOSIS — R600 Localized edema: Secondary | ICD-10-CM

## 2021-08-25 DIAGNOSIS — R208 Other disturbances of skin sensation: Secondary | ICD-10-CM

## 2021-08-25 DIAGNOSIS — M25612 Stiffness of left shoulder, not elsewhere classified: Secondary | ICD-10-CM | POA: Diagnosis not present

## 2021-08-25 DIAGNOSIS — R278 Other lack of coordination: Secondary | ICD-10-CM

## 2021-08-25 DIAGNOSIS — M25512 Pain in left shoulder: Secondary | ICD-10-CM

## 2021-08-25 DIAGNOSIS — M25622 Stiffness of left elbow, not elsewhere classified: Secondary | ICD-10-CM

## 2021-08-25 DIAGNOSIS — M25532 Pain in left wrist: Secondary | ICD-10-CM

## 2021-08-25 DIAGNOSIS — G8929 Other chronic pain: Secondary | ICD-10-CM

## 2021-08-25 DIAGNOSIS — M25642 Stiffness of left hand, not elsewhere classified: Secondary | ICD-10-CM

## 2021-08-25 DIAGNOSIS — M6281 Muscle weakness (generalized): Secondary | ICD-10-CM

## 2021-08-25 NOTE — Therapy (Signed)
[facility] [address] [place], [place], [address] Phone: [phone]   Fax:  [phone]  Occupational Therapy Treatment  Patient Details  Name: Rodney Rose MRN: [id number] Date of Birth: January 08, 1976 Referring Provider (OT): Dr Oswaldo Done   Encounter Date: 08/25/2021   OT End of Session - 08/25/21 1853     Visit Number 9    Number of Visits 17    Date for OT Re-Evaluation 09/27/21    Authorization Type [employer] WF-Out of Network    OT Start Time 1615    OT Stop Time 1700    OT Time Calculation (min) 45 min    Activity Tolerance Patient tolerated treatment well    Behavior During Therapy [facility] for tasks assessed/performed             Past Medical History:  Diagnosis Date   Bipolar disorder (HCC)    Dr. Georgia Lopes with [facility] Psychiatry   Chronic back pain    Hypertension 2017   Schizophrenia [facility])    Stroke (HCC) 11/2020    Past Surgical History:  Procedure Laterality Date   BUBBLE STUDY  12/05/2020   Procedure: BUBBLE STUDY;  Surgeon: Wendall Stade, MD;  Location: [facility] ENDOSCOPY;  Service: Cardiovascular;;   NO PAST SURGERIES  10/2018   TEE WITHOUT CARDIOVERSION N/A 12/05/2020   Procedure: TRANSESOPHAGEAL ECHOCARDIOGRAM (TEE);  Surgeon: Wendall Stade, MD;  Location: [facility] ENDOSCOPY;  Service: Cardiovascular;  Laterality: N/A;    There were no vitals filed for this visit.                 OT Treatments/Exercises (OP) - 08/25/21 0001       ADLs   Work Functional use of LUE in clinic to open doors with handles, turn key to unlock and open door.  Manipulating smaller items in hand with emphasis on improved wrist extension for grasp/release.      Neurological Re-education Exercises   Other Exercises 1 Supine exercises to address functional reach patterns - see patient instructions.  Patient cannot effectively start motion but at ~ 30 degrees of flexion able to complete motion for chest press.   Patient with significantly improved control of shoulder and elbow - some difficulty maintaining forearm rotation to maintain palm of hand on ball.                    OT Education - 08/25/21 1853     Education Details supine shoulder exercises    Person(s) Educated Patient    Methods Explanation;Demonstration;Tactile cues;Verbal cues;Handout    Comprehension Verbalized understanding;Returned demonstration              OT Short Term Goals - 08/25/21 1854       OT SHORT TERM GOAL #1   Title Patient will complete an HEP designed to improve PROM to LUE    Baseline No HEP    Time 4    Period Weeks    Status Achieved    Target Date 08/28/21      OT SHORT TERM GOAL #2   Title Patient will report no increase in pain in left arm with passive stretching, or HEP    Baseline Pain ranges up to 5-6/10    Time 4    Period Weeks    Status Achieved      OT SHORT TERM GOAL #3   Title Patient will demonstrate increase of three blocks in box and blocks test  Baseline 14    Time 4    Period Weeks    Status Achieved   19     OT SHORT TERM GOAL #4   Title Patient will demonstrate ability to stabilize a potato with left hand to peel with right hand    Baseline unable    Time 4    Period Weeks    Status On-going      OT SHORT TERM GOAL #5   Title Patient will be able to cut food on his plate without assistance    Baseline Unable    Time 4    Period Weeks    Status On-going               OT Long Term Goals - 08/25/21 1854       OT LONG TERM GOAL #1   Title Patient will complete updated HEP    Baseline No HEP    Time 8    Period Weeks    Status On-going      OT LONG TERM GOAL #2   Title Patient will utilize BUE to wash dishes x 5 min without dropping, or wrist pain    Baseline Unable    Time 8    Period Weeks    Status On-going      OT LONG TERM GOAL #3   Title Patient will tie shoes using BUE    Baseline unable    Time 8    Period Weeks    Status  On-going      OT LONG TERM GOAL #4   Title Patient will demonstrate low level reach with LUE - shoulder flexion with elbow extension to grasp and release lightweight (less than 2lb) object    Baseline Unable to flex shoulder and extend elbow    Time 8    Period Weeks    Status On-going      OT LONG TERM GOAL #5   Title Patient will complete 9 hole peg test with LUE in less than 45 sec    Baseline 2 min 20 sec    Time 8    Period Weeks    Status On-going                   Plan - 08/25/21 1854     Clinical Impression Statement Patient showing improved shoulder and elbow controlled movement through partial range, although still challenging to use LUE functioanlly.    OT Occupational Profile and History Detailed Assessment- Review of Records and additional review of physical, cognitive, psychosocial history related to current functional performance    Occupational performance deficits (Please refer to evaluation for details): ADL's;IADL's;Work    Body Structure / Function / Physical Skills ADL;Dexterity;Flexibility;Muscle spasms;ROM;Strength;Tone;IADL;FMC;Edema;Coordination;Balance;Body mechanics;Decreased knowledge of precautions;Endurance;Pain;Sensation;UE functional use;GMC;Decreased knowledge of use of DME    Rehab Potential Good    Clinical Decision Making Several treatment options, min-mod task modification necessary    Comorbidities Affecting Occupational Performance: May have comorbidities impacting occupational performance    Modification or Assistance to Complete Evaluation  No modification of tasks or assist necessary to complete eval    OT Frequency 2x / week    OT Duration 8 weeks    OT Treatment/Interventions Self-care/ADL training;Cryotherapy;Therapeutic exercise;DME and/or AE instruction;Balance training;Functional Mobility Training;Aquatic Therapy;Electrical Stimulation;Ultrasound;Fluidtherapy;Neuromuscular education;Manual Therapy;Splinting;Visual/perceptual  remediation/compensation;Moist Heat;Passive range of motion;Therapeutic activities;Patient/family education    Plan NMR  LUE, work on functional tasks with LUE and stabilizing to increasing use for IADLs (cooking, cleaning)  Consulted and Agree with Plan of Care Patient             Patient will benefit from skilled therapeutic intervention in order to improve the following deficits and impairments:   Body Structure / Function / Physical Skills: ADL, Dexterity, Flexibility, Muscle spasms, ROM, Strength, Tone, IADL, FMC, Edema, Coordination, Balance, Body mechanics, Decreased knowledge of precautions, Endurance, Pain, Sensation, UE functional use, GMC, Decreased knowledge of use of DME       Visit Diagnosis: Stiffness of left shoulder, not elsewhere classified  Stiffness of left elbow, not elsewhere classified  Stiffness of left hand, not elsewhere classified  Other lack of coordination  Chronic left shoulder pain  Pain in left wrist  Localized edema  Other disturbances of skin sensation  Muscle weakness (generalized)    Problem List Patient Active Problem List   Diagnosis Date Noted   CKD (chronic kidney disease), stage III with proteinuria (HCC) 12/11/2020   Swelling of left hand 12/11/2020   Type 2 diabetes mellitus with diabetic nephropathy, without long-term current use of insulin (HCC)    Acute on chronic anemia    CVA (cerebral vascular accident) (HCC) 12/02/2020   Essential hypertension, benign 11/02/2018   Tobacco use disorder 11/02/2018   Bipolar disorder (HCC) 11/02/2018    Collier Salina 08/25/2021, 6:55 PM  [facility] [address] [place], [place], [address] Phone: [phone]   Fax:  [phone]  Name: ADONUS USELMAN MRN: [id number] Date of Birth: [birth year]

## 2021-08-25 NOTE — Patient Instructions (Signed)
Shoulder and elbow exercises.    Lying on your back - help your left arm reach up at chest height with elbow straight.  Place paper towel holder or stick in hands.   -Chest press - bend and straighten elbows so hands move towards chest and then towards ceiling.   -Rainbow Arc - press hands up toward ceiling until elbows are straight, then move back as far as you can with control, and forward as far as possible with control.   - Straight arm press - keep elbows straight with hands toward ceiling and move hands further up by spreading your shoulder blades apart.

## 2021-08-26 ENCOUNTER — Encounter: Payer: Self-pay | Admitting: Occupational Therapy

## 2021-08-26 ENCOUNTER — Ambulatory Visit: Payer: BLUE CROSS/BLUE SHIELD | Admitting: Occupational Therapy

## 2021-08-26 DIAGNOSIS — M25642 Stiffness of left hand, not elsewhere classified: Secondary | ICD-10-CM

## 2021-08-26 DIAGNOSIS — R278 Other lack of coordination: Secondary | ICD-10-CM

## 2021-08-26 DIAGNOSIS — M6281 Muscle weakness (generalized): Secondary | ICD-10-CM

## 2021-08-26 DIAGNOSIS — G8929 Other chronic pain: Secondary | ICD-10-CM

## 2021-08-26 DIAGNOSIS — M25622 Stiffness of left elbow, not elsewhere classified: Secondary | ICD-10-CM

## 2021-08-26 DIAGNOSIS — M25532 Pain in left wrist: Secondary | ICD-10-CM

## 2021-08-26 DIAGNOSIS — M25612 Stiffness of left shoulder, not elsewhere classified: Secondary | ICD-10-CM | POA: Diagnosis not present

## 2021-08-26 DIAGNOSIS — M25512 Pain in left shoulder: Secondary | ICD-10-CM

## 2021-08-26 NOTE — Therapy (Signed)
[facility] [address] [place], [place], [address] Phone: [phone]   Fax:  [phone]  Occupational Therapy Treatment  Patient Details  Name: Rodney Rose MRN: [id number] Date of Birth: [birth year] Referring Provider (OT): Dr Oswaldo Done   Encounter Date: 08/26/2021   OT End of Session - 08/26/21 1612     Visit Number 10    Number of Visits 17    Date for OT Re-Evaluation 09/27/21    Authorization Type [employer]    OT Start Time 1612    OT Stop Time 1656    OT Time Calculation (min) 44 min    Activity Tolerance Patient tolerated treatment well    Behavior During Therapy [facility] for tasks assessed/performed             Past Medical History:  Diagnosis Date   Bipolar disorder (HCC)    Dr. Georgia Lopes with [facility]   Chronic back pain    Hypertension 2017   Schizophrenia [facility])    Stroke (HCC) 11/2020    Past Surgical History:  Procedure Laterality Date   BUBBLE STUDY  12/05/2020   Procedure: BUBBLE STUDY;  Surgeon: Wendall Stade, MD;  Location: [facility] ENDOSCOPY;  Service: Cardiovascular;;   NO PAST SURGERIES  10/2018   TEE WITHOUT CARDIOVERSION N/A 12/05/2020   Procedure: TRANSESOPHAGEAL ECHOCARDIOGRAM (TEE);  Surgeon: Wendall Stade, MD;  Location: [facility] ENDOSCOPY;  Service: Cardiovascular;  Laterality: N/A;    There were no vitals filed for this visit.   Subjective Assessment - 08/26/21 1612     Subjective  "nothing new"    Currently in Pain? No/denies                          OT Treatments/Exercises (OP) - 08/26/21 0001       ADLs   Eating simulated cutting up food with theraputty and fork and butter knife with good coordination. Pt required some asistance for stabilizing plate as it was sliding on table but was able to coordinate bimanually with fork and knife    Cooking simualted peeling potato with tennis ball and peeler with good cooridnation and ability to  stabilie tennis ball with LUE and no drops and use RUE for peeler      Neurological Re-education Exercises   Other Exercises 1 Supine with pvc frame with mod A for beginning shoulder movement d/t weakness. Pt completed chest press and extended forward flexion. Pt demonstrates improved control and strength with LUE. Pt completed seated unweighted dowel exercises for rows and mid level shoulder flexion x 10 reps. Hemi glide for forward reahcing and horizontal abduction with great control at shoulder and able to withstand moderate resistance from OT.      Fine Motor Coordination (Hand/Wrist)   Fine Motor Coordination Large Pegboard;Small Pegboard    Large Pegboard semi circle pegboard with little difficulty and min drops    Small Pegboard medium pegs with LUE  pt tried with holding dice in LUE palm but had max difficulty to discontinued holding dice. Pt placed medium pegs into board with min difficulty and min drops                      OT Short Term Goals - 08/26/21 1634       OT SHORT TERM GOAL #1   Title Patient will complete an HEP designed to improve PROM to LUE    Baseline  No HEP    Time 4    Period Weeks    Status Achieved    Target Date 08/28/21      OT SHORT TERM GOAL #2   Title Patient will report no increase in pain in left arm with passive stretching, or HEP    Baseline Pain ranges up to 5-6/10    Time 4    Period Weeks    Status Achieved      OT SHORT TERM GOAL #3   Title Patient will demonstrate increase of three blocks in box and blocks test    Baseline 14    Time 4    Period Weeks    Status Achieved   19     OT SHORT TERM GOAL #4   Title Patient will demonstrate ability to stabilize a potato with left hand to peel with right hand    Baseline unable    Time 4    Period Weeks    Status On-going   simulated with tennis ball and peeler in clinic - test again for consistency.     OT SHORT TERM GOAL #5   Title Patient will be able to cut food on his  plate without assistance    Baseline Unable    Time 4    Period Weeks    Status Achieved   simulated in clinic with theraputty and fork and butter knife with good bimanual coordination              OT Long Term Goals - 08/25/21 1854       OT LONG TERM GOAL #1   Title Patient will complete updated HEP    Baseline No HEP    Time 8    Period Weeks    Status On-going      OT LONG TERM GOAL #2   Title Patient will utilize BUE to wash dishes x 5 min without dropping, or wrist pain    Baseline Unable    Time 8    Period Weeks    Status On-going      OT LONG TERM GOAL #3   Title Patient will tie shoes using BUE    Baseline unable    Time 8    Period Weeks    Status On-going      OT LONG TERM GOAL #4   Title Patient will demonstrate low level reach with LUE - shoulder flexion with elbow extension to grasp and release lightweight (less than 2lb) object    Baseline Unable to flex shoulder and extend elbow    Time 8    Period Weeks    Status On-going      OT LONG TERM GOAL #5   Title Patient will complete 9 hole peg test with LUE in less than 45 sec    Baseline 2 min 20 sec    Time 8    Period Weeks    Status On-going                   Plan - 08/26/21 1648     Clinical Impression Statement Pt showing incredible progress towards shoulder control and coordination with LUE. Continue progressing towards goals.    OT Occupational Profile and History Detailed Assessment- Review of Records and additional review of physical, cognitive, psychosocial history related to current functional performance    Occupational performance deficits (Please refer to evaluation for details): ADL's;IADL's;Work    Body Structure / Function / Physical Skills  ADL;Dexterity;Flexibility;Muscle spasms;ROM;Strength;Tone;IADL;FMC;Edema;Coordination;Balance;Body mechanics;Decreased knowledge of precautions;Endurance;Pain;Sensation;UE functional use;GMC;Decreased knowledge of use of DME     Rehab Potential Good    Clinical Decision Making Several treatment options, min-mod task modification necessary    Comorbidities Affecting Occupational Performance: May have comorbidities impacting occupational performance    Modification or Assistance to Complete Evaluation  No modification of tasks or assist necessary to complete eval    OT Frequency 2x / week    OT Duration 8 weeks    OT Treatment/Interventions Self-care/ADL training;Cryotherapy;Therapeutic exercise;DME and/or AE instruction;Balance training;Functional Mobility Training;Aquatic Therapy;Electrical Stimulation;Ultrasound;Fluidtherapy;Neuromuscular education;Manual Therapy;Splinting;Visual/perceptual remediation/compensation;Moist Heat;Passive range of motion;Therapeutic activities;Patient/family education    Plan NMR  LUE, work on functional tasks with LUE and stabilizing to increasing use for IADLs (cooking, cleaning)    Consulted and Agree with Plan of Care Patient             Patient will benefit from skilled therapeutic intervention in order to improve the following deficits and impairments:   Body Structure / Function / Physical Skills: ADL, Dexterity, Flexibility, Muscle spasms, ROM, Strength, Tone, IADL, FMC, Edema, Coordination, Balance, Body mechanics, Decreased knowledge of precautions, Endurance, Pain, Sensation, UE functional use, GMC, Decreased knowledge of use of DME       Visit Diagnosis: Stiffness of left shoulder, not elsewhere classified  Stiffness of left elbow, not elsewhere classified  Stiffness of left hand, not elsewhere classified  Other lack of coordination  Pain in left wrist  Chronic left shoulder pain  Muscle weakness (generalized)    Problem List Patient Active Problem List   Diagnosis Date Noted   CKD (chronic kidney disease), stage III with proteinuria (HCC) 12/11/2020   Swelling of left hand 12/11/2020   Type 2 diabetes mellitus with diabetic nephropathy, without long-term  current use of insulin (HCC)    Acute on chronic anemia    CVA (cerebral vascular accident) (HCC) 12/02/2020   Essential hypertension, benign 11/02/2018   Tobacco use disorder 11/02/2018   Bipolar disorder (HCC) 11/02/2018    Junious Dresser MOT, OTR/L  08/26/2021, 5:03 PM   Outpt Rehabilitation [facility] [address] [place], [place], [address] Phone: [phone]   Fax:  [phone]  Name: Rodney Rose MRN: [id number] Date of Birth: February 11, 1976

## 2021-09-01 ENCOUNTER — Other Ambulatory Visit: Payer: Self-pay

## 2021-09-01 ENCOUNTER — Encounter: Payer: Self-pay | Admitting: Occupational Therapy

## 2021-09-01 ENCOUNTER — Ambulatory Visit: Payer: BLUE CROSS/BLUE SHIELD | Attending: Neurology | Admitting: Occupational Therapy

## 2021-09-01 DIAGNOSIS — M6281 Muscle weakness (generalized): Secondary | ICD-10-CM | POA: Diagnosis present

## 2021-09-01 DIAGNOSIS — R208 Other disturbances of skin sensation: Secondary | ICD-10-CM | POA: Insufficient documentation

## 2021-09-01 DIAGNOSIS — M25532 Pain in left wrist: Secondary | ICD-10-CM | POA: Diagnosis present

## 2021-09-01 DIAGNOSIS — M25512 Pain in left shoulder: Secondary | ICD-10-CM | POA: Insufficient documentation

## 2021-09-01 DIAGNOSIS — G8929 Other chronic pain: Secondary | ICD-10-CM

## 2021-09-01 DIAGNOSIS — M25612 Stiffness of left shoulder, not elsewhere classified: Secondary | ICD-10-CM

## 2021-09-01 DIAGNOSIS — R600 Localized edema: Secondary | ICD-10-CM

## 2021-09-01 DIAGNOSIS — R278 Other lack of coordination: Secondary | ICD-10-CM | POA: Diagnosis present

## 2021-09-01 DIAGNOSIS — M25642 Stiffness of left hand, not elsewhere classified: Secondary | ICD-10-CM

## 2021-09-01 DIAGNOSIS — M25622 Stiffness of left elbow, not elsewhere classified: Secondary | ICD-10-CM

## 2021-09-01 NOTE — Therapy (Signed)
[facility] [address] [place], [place], [address] Phone: [phone]   Fax:  [phone]  Occupational Therapy Treatment  Patient Details  Name: Rodney Rose MRN: [id number] Date of Birth: 1976/07/10 Referring Provider (OT): Dr Oswaldo Done   Encounter Date: 09/01/2021   OT End of Session - 09/01/21 1821     Visit Number 11    Number of Visits 17    Date for OT Re-Evaluation 09/27/21    Authorization Type [employer]-Out of Network    OT Start Time 1700    OT Stop Time 1747    OT Time Calculation (min) 47 min    Activity Tolerance Patient tolerated treatment well    Behavior During Therapy [facility] for tasks assessed/performed             Past Medical History:  Diagnosis Date   Bipolar disorder (HCC)    Dr. Georgia Lopes with [facility]   Chronic back pain    Hypertension 2017   Schizophrenia [facility])    Stroke (HCC) 11/2020    Past Surgical History:  Procedure Laterality Date   BUBBLE STUDY  12/05/2020   Procedure: BUBBLE STUDY;  Surgeon: Wendall Stade, MD;  Location: [facility] ENDOSCOPY;  Service: Cardiovascular;;   NO PAST SURGERIES  10/2018   TEE WITHOUT CARDIOVERSION N/A 12/05/2020   Procedure: TRANSESOPHAGEAL ECHOCARDIOGRAM (TEE);  Surgeon: Wendall Stade, MD;  Location: [facility] ENDOSCOPY;  Service: Cardiovascular;  Laterality: N/A;    There were no vitals filed for this visit.   Subjective Assessment - 09/01/21 1817     Subjective  When I go to the grocery store I can push the cart    Currently in Pain? No/denies    Pain Score 0-No pain                          OT Treatments/Exercises (OP) - 09/01/21 0001       ADLs   Cooking Worked on bimanual functional task in kitchen washing dishes.  Patient needed cueing to look at shape of left hand as approaching each dish - ring finger flexing at PIP consistently - patient unaware, yet able to self correct with cueing.      Neurological  Re-education Exercises   Other Exercises 1 Body on arm exercise / closed chain to encourage increased length and alignment of LUE.  Patient challenged to have left shoulder in increasing flexion with elbow extended.  Patient needs cueing and facilitation to maintain upright posture in midline or even slightly to left to decrease discomfort.  Working toward accepting weight through heel of hand in LUE.  Patient did best with left arm slightly forward in modified 4 point to reduce wrist extension.    Other Exercises 2 Bilateral task - carrying lightweight ball, tossing, catching, bouncing with BUE.                  Upper Extremity Functional Index Score :   /80     OT Short Term Goals - 09/01/21 1822       OT SHORT TERM GOAL #1   Title Patient will complete an HEP designed to improve PROM to LUE    Baseline No HEP    Time 4    Period Weeks    Status Achieved    Target Date 08/28/21      OT SHORT TERM GOAL #2   Title Patient will report no increase in pain  in left arm with passive stretching, or HEP    Baseline Pain ranges up to 5-6/10    Time 4    Period Weeks    Status Achieved      OT SHORT TERM GOAL #3   Title Patient will demonstrate increase of three blocks in box and blocks test    Baseline 14    Time 4    Period Weeks    Status Achieved   19     OT SHORT TERM GOAL #4   Title Patient will demonstrate ability to stabilize a potato with left hand to peel with right hand    Baseline unable    Time 4    Period Weeks    Status On-going   simulated with tennis ball and peeler in clinic - test again for consistency.     OT SHORT TERM GOAL #5   Title Patient will be able to cut food on his plate without assistance    Baseline Unable    Time 4    Period Weeks    Status Achieved   simulated in clinic with theraputty and fork and butter knife with good bimanual coordination              OT Long Term Goals - 09/01/21 1822       OT LONG TERM GOAL #1   Title  Patient will complete updated HEP    Baseline No HEP    Time 8    Period Weeks    Status On-going      OT LONG TERM GOAL #2   Title Patient will utilize BUE to wash dishes x 5 min without dropping, or wrist pain    Baseline Unable    Time 8    Period Weeks    Status On-going      OT LONG TERM GOAL #3   Title Patient will tie shoes using BUE    Baseline unable    Time 8    Period Weeks    Status On-going      OT LONG TERM GOAL #4   Title Patient will demonstrate low level reach with LUE - shoulder flexion with elbow extension to grasp and release lightweight (less than 2lb) object    Baseline Unable to flex shoulder and extend elbow    Time 8    Period Weeks    Status On-going      OT LONG TERM GOAL #5   Title Patient will complete 9 hole peg test with LUE in less than 45 sec    Baseline 2 min 20 sec    Time 8    Period Weeks    Status On-going                   Plan - 09/01/21 1821     Clinical Impression Statement Pt showing improving functional use of LUE. Continue progressing towards goals.    OT Occupational Profile and History Detailed Assessment- Review of Records and additional review of physical, cognitive, psychosocial history related to current functional performance    Occupational performance deficits (Please refer to evaluation for details): ADL's;IADL's;Work    Body Structure / Function / Physical Skills ADL;Dexterity;Flexibility;Muscle spasms;ROM;Strength;Tone;IADL;FMC;Edema;Coordination;Balance;Body mechanics;Decreased knowledge of precautions;Endurance;Pain;Sensation;UE functional use;GMC;Decreased knowledge of use of DME    Rehab Potential Good    Clinical Decision Making Several treatment options, min-mod task modification necessary    Comorbidities Affecting Occupational Performance: May have comorbidities impacting occupational performance  Modification or Assistance to Complete Evaluation  No modification of tasks or assist necessary  to complete eval    OT Frequency 2x / week    OT Duration 8 weeks    OT Treatment/Interventions Self-care/ADL training;Cryotherapy;Therapeutic exercise;DME and/or AE instruction;Balance training;Functional Mobility Training;Aquatic Therapy;Electrical Stimulation;Ultrasound;Fluidtherapy;Neuromuscular education;Manual Therapy;Splinting;Visual/perceptual remediation/compensation;Moist Heat;Passive range of motion;Therapeutic activities;Patient/family education    Plan NMR  LUE, work on functional tasks with LUE and stabilizing to increasing use for IADLs (cooking, cleaning)    Consulted and Agree with Plan of Care Patient             Patient will benefit from skilled therapeutic intervention in order to improve the following deficits and impairments:   Body Structure / Function / Physical Skills: ADL, Dexterity, Flexibility, Muscle spasms, ROM, Strength, Tone, IADL, FMC, Edema, Coordination, Balance, Body mechanics, Decreased knowledge of precautions, Endurance, Pain, Sensation, UE functional use, GMC, Decreased knowledge of use of DME       Visit Diagnosis: Stiffness of left elbow, not elsewhere classified  Stiffness of left hand, not elsewhere classified  Other lack of coordination  Pain in left wrist  Chronic left shoulder pain  Muscle weakness (generalized)  Localized edema  Stiffness of left shoulder, not elsewhere classified    Problem List Patient Active Problem List   Diagnosis Date Noted   CKD (chronic kidney disease), stage III with proteinuria (HCC) 12/11/2020   Swelling of left hand 12/11/2020   Type 2 diabetes mellitus with diabetic nephropathy, without long-term current use of insulin (HCC)    Acute on chronic anemia    CVA (cerebral vascular accident) (HCC) 12/02/2020   Essential hypertension, benign 11/02/2018   Tobacco use disorder 11/02/2018   Bipolar disorder (HCC) 11/02/2018    Collier Salina, OT 09/01/2021, 6:23 PM  [facility]  [facility] [address] [place], [place], [address] Phone: [phone]   Fax:  [phone]  Name: Rodney Rose MRN: [id number] Date of Birth: April 17, 1976

## 2021-09-02 ENCOUNTER — Encounter: Payer: Self-pay | Admitting: Occupational Therapy

## 2021-09-02 ENCOUNTER — Ambulatory Visit: Payer: BLUE CROSS/BLUE SHIELD | Admitting: Occupational Therapy

## 2021-09-02 DIAGNOSIS — M25612 Stiffness of left shoulder, not elsewhere classified: Secondary | ICD-10-CM

## 2021-09-02 DIAGNOSIS — G8929 Other chronic pain: Secondary | ICD-10-CM

## 2021-09-02 DIAGNOSIS — R278 Other lack of coordination: Secondary | ICD-10-CM

## 2021-09-02 DIAGNOSIS — R208 Other disturbances of skin sensation: Secondary | ICD-10-CM

## 2021-09-02 DIAGNOSIS — M25642 Stiffness of left hand, not elsewhere classified: Secondary | ICD-10-CM

## 2021-09-02 DIAGNOSIS — M25512 Pain in left shoulder: Secondary | ICD-10-CM

## 2021-09-02 DIAGNOSIS — M25622 Stiffness of left elbow, not elsewhere classified: Secondary | ICD-10-CM | POA: Diagnosis not present

## 2021-09-02 DIAGNOSIS — M25532 Pain in left wrist: Secondary | ICD-10-CM

## 2021-09-02 DIAGNOSIS — M6281 Muscle weakness (generalized): Secondary | ICD-10-CM

## 2021-09-02 NOTE — Therapy (Signed)
[facility] [address] [place], [place], [address] Phone: [phone]   Fax:  [phone]  Occupational Therapy Treatment  Patient Details  Name: Rodney Rose MRN: [id number] Date of Birth: 1976-01-20 Referring Provider (OT): Dr Oswaldo Done   Encounter Date: 09/02/2021   OT End of Session - 09/02/21 1738     Visit Number 12    Number of Visits 17    Date for OT Re-Evaluation 09/27/21    Authorization Type [employer]    OT Start Time 1615    OT Stop Time 1700    OT Time Calculation (min) 45 min    Activity Tolerance Patient tolerated treatment well    Behavior During Therapy [facility] for tasks assessed/performed             Past Medical History:  Diagnosis Date   Bipolar disorder (HCC)    Dr. Georgia Lopes with [facility] Psychiatry   Chronic back pain    Hypertension 2017   Schizophrenia [facility])    Stroke (HCC) 11/2020    Past Surgical History:  Procedure Laterality Date   BUBBLE STUDY  12/05/2020   Procedure: BUBBLE STUDY;  Surgeon: Wendall Stade, MD;  Location: [facility] ENDOSCOPY;  Service: Cardiovascular;;   NO PAST SURGERIES  10/2018   TEE WITHOUT CARDIOVERSION N/A 12/05/2020   Procedure: TRANSESOPHAGEAL ECHOCARDIOGRAM (TEE);  Surgeon: Wendall Stade, MD;  Location: [facility] ENDOSCOPY;  Service: Cardiovascular;  Laterality: N/A;    There were no vitals filed for this visit.   Subjective Assessment - 09/02/21 1620     Subjective  About the same - regarding arm since yesterday's session    Currently in Pain? No/denies    Pain Score 0-No pain                          OT Treatments/Exercises (OP) - 09/02/21 0001       Neurological Re-education Exercises   Other Exercises 1 Closed chain body on arm to load leftr arm, then activate stabilizing musculatrue.  Continue to work toward movements that encourage shoulder flexion with elbow extension.  Working toward increased activation of wrist  extensors.  SUpine place and hold exercises with emphasis on increasing range of motion and elbow extension.    Other Exercises 2 Working on open chain low reach activities to obtain and place 1-2 lb object using shoulder flex and elbow extension with cueing and facilitation.      Manual Therapy   Joint Mobilization Carpal mobilization to help reduce pain and poor mechanics in wrist - Standing at elevated surface working to keep heel of hand on surface and slowly increasing weight through, weight shifting left/right/forward.  Distraction forearm to carpals.  .                      OT Short Term Goals - 09/02/21 1738       OT SHORT TERM GOAL #1   Title Patient will complete an HEP designed to improve PROM to LUE    Baseline No HEP    Time 4    Period Weeks    Status Achieved    Target Date 08/28/21      OT SHORT TERM GOAL #2   Title Patient will report no increase in pain in left arm with passive stretching, or HEP    Baseline Pain ranges up to 5-6/10    Time 4  Period Weeks    Status Achieved      OT SHORT TERM GOAL #3   Title Patient will demonstrate increase of three blocks in box and blocks test    Baseline 14    Time 4    Period Weeks    Status Achieved   19     OT SHORT TERM GOAL #4   Title Patient will demonstrate ability to stabilize a potato with left hand to peel with right hand    Baseline unable    Time 4    Period Weeks    Status On-going   simulated with tennis ball and peeler in clinic - test again for consistency.     OT SHORT TERM GOAL #5   Title Patient will be able to cut food on his plate without assistance    Baseline Unable    Time 4    Period Weeks    Status Achieved   simulated in clinic with theraputty and fork and butter knife with good bimanual coordination              OT Long Term Goals - 09/02/21 1739       OT LONG TERM GOAL #1   Title Patient will complete updated HEP    Baseline No HEP    Time 8    Period Weeks     Status On-going      OT LONG TERM GOAL #2   Title Patient will utilize BUE to wash dishes x 5 min without dropping, or wrist pain    Baseline Unable    Time 8    Period Weeks    Status Achieved      OT LONG TERM GOAL #3   Title Patient will tie shoes using BUE    Baseline unable    Time 8    Period Weeks    Status Achieved      OT LONG TERM GOAL #4   Title Patient will demonstrate low level reach with LUE - shoulder flexion with elbow extension to grasp and release lightweight (less than 2lb) object    Baseline Unable to flex shoulder and extend elbow    Time 8    Period Weeks    Status Achieved      OT LONG TERM GOAL #5   Title Patient will complete 9 hole peg test with LUE in less than 45 sec    Baseline 2 min 20 sec    Time 8    Period Weeks    Status On-going                   Plan - 09/02/21 1738     Clinical Impression Statement Pt showing improving functional use of LUE. Continue progressing towards goals.    OT Occupational Profile and History Detailed Assessment- Review of Records and additional review of physical, cognitive, psychosocial history related to current functional performance    Occupational performance deficits (Please refer to evaluation for details): ADL's;IADL's;Work    Body Structure / Function / Physical Skills ADL;Dexterity;Flexibility;Muscle spasms;ROM;Strength;Tone;IADL;FMC;Edema;Coordination;Balance;Body mechanics;Decreased knowledge of precautions;Endurance;Pain;Sensation;UE functional use;GMC;Decreased knowledge of use of DME    Rehab Potential Good    Clinical Decision Making Several treatment options, min-mod task modification necessary    Comorbidities Affecting Occupational Performance: May have comorbidities impacting occupational performance    Modification or Assistance to Complete Evaluation  No modification of tasks or assist necessary to complete eval    OT Frequency 2x /  week    OT Duration 8 weeks    OT  Treatment/Interventions Self-care/ADL training;Cryotherapy;Therapeutic exercise;DME and/or AE instruction;Balance training;Functional Mobility Training;Aquatic Therapy;Electrical Stimulation;Ultrasound;Fluidtherapy;Neuromuscular education;Manual Therapy;Splinting;Visual/perceptual remediation/compensation;Moist Heat;Passive range of motion;Therapeutic activities;Patient/family education    Plan NMR  LUE, work on functional tasks with LUE and stabilizing to increasing use for IADLs (cooking, cleaning)    Consulted and Agree with Plan of Care Patient             Patient will benefit from skilled therapeutic intervention in order to improve the following deficits and impairments:   Body Structure / Function / Physical Skills: ADL, Dexterity, Flexibility, Muscle spasms, ROM, Strength, Tone, IADL, FMC, Edema, Coordination, Balance, Body mechanics, Decreased knowledge of precautions, Endurance, Pain, Sensation, UE functional use, GMC, Decreased knowledge of use of DME       Visit Diagnosis: Stiffness of left elbow, not elsewhere classified  Stiffness of left hand, not elsewhere classified  Other lack of coordination  Pain in left wrist  Chronic left shoulder pain  Muscle weakness (generalized)  Stiffness of left shoulder, not elsewhere classified  Other disturbances of skin sensation    Problem List Patient Active Problem List   Diagnosis Date Noted   CKD (chronic kidney disease), stage III with proteinuria (HCC) 12/11/2020   Swelling of left hand 12/11/2020   Type 2 diabetes mellitus with diabetic nephropathy, without long-term current use of insulin (HCC)    Acute on chronic anemia    CVA (cerebral vascular accident) (HCC) 12/02/2020   Essential hypertension, benign 11/02/2018   Tobacco use disorder 11/02/2018   Bipolar disorder (HCC) 11/02/2018    Collier Salina, OT/L 09/02/2021, 5:39 PM  [facility] [address] [place], [place], [address] Phone: [phone]   Fax:  [phone]  Name: SIRE POET MRN: [id number] Date of Birth: [birth year]

## 2021-09-08 ENCOUNTER — Ambulatory Visit: Payer: BLUE CROSS/BLUE SHIELD | Admitting: Occupational Therapy

## 2021-09-09 ENCOUNTER — Ambulatory Visit: Payer: BLUE CROSS/BLUE SHIELD | Admitting: Occupational Therapy

## 2021-09-09 ENCOUNTER — Other Ambulatory Visit: Payer: Self-pay

## 2021-09-09 ENCOUNTER — Encounter: Payer: Self-pay | Admitting: Occupational Therapy

## 2021-09-09 DIAGNOSIS — M25532 Pain in left wrist: Secondary | ICD-10-CM

## 2021-09-09 DIAGNOSIS — G8929 Other chronic pain: Secondary | ICD-10-CM

## 2021-09-09 DIAGNOSIS — M25622 Stiffness of left elbow, not elsewhere classified: Secondary | ICD-10-CM | POA: Diagnosis not present

## 2021-09-09 DIAGNOSIS — M25642 Stiffness of left hand, not elsewhere classified: Secondary | ICD-10-CM

## 2021-09-09 DIAGNOSIS — R278 Other lack of coordination: Secondary | ICD-10-CM

## 2021-09-09 DIAGNOSIS — R208 Other disturbances of skin sensation: Secondary | ICD-10-CM

## 2021-09-09 DIAGNOSIS — M25612 Stiffness of left shoulder, not elsewhere classified: Secondary | ICD-10-CM

## 2021-09-09 DIAGNOSIS — M6281 Muscle weakness (generalized): Secondary | ICD-10-CM

## 2021-09-09 NOTE — Therapy (Signed)
[facility] [address] [place], [place], [address] Phone: [phone]   Fax:  [phone]  Occupational Therapy Treatment & Discharge  Patient Details  Name: Rodney Rose MRN: [id number] Date of Birth: 09-06-1976 Referring Provider (OT): Dr Evette Doffing   Encounter Date: 09/09/2021   OT End of Session - 09/09/21 1610     Visit Number 13    Number of Visits 17    Date for OT Re-Evaluation 09/27/21    Authorization Type [employer]-Out of Network    OT Start Time 1608    OT Stop Time 1650    OT Time Calculation (min) 42 min    Activity Tolerance Patient tolerated treatment well    Behavior During Therapy [facility] for tasks assessed/performed             Past Medical History:  Diagnosis Date   Bipolar disorder ([place])    Dr. Heriberto Antigua with [facility] Psychiatry   Chronic back pain    Hypertension 2017   Schizophrenia [facility])    Stroke ([place]) 11/2020    Past Surgical History:  Procedure Laterality Date   BUBBLE STUDY  12/05/2020   Procedure: BUBBLE STUDY;  Surgeon: Josue Hector, MD;  Location: [place];  Service: Cardiovascular;;   NO PAST SURGERIES  10/2018   TEE WITHOUT CARDIOVERSION N/A 12/05/2020   Procedure: TRANSESOPHAGEAL ECHOCARDIOGRAM (TEE);  Surgeon: Josue Hector, MD;  Location: [facility] ENDOSCOPY;  Service: Cardiovascular;  Laterality: N/A;    There were no vitals filed for this visit.   Subjective Assessment - 09/09/21 1609     Subjective  "been working on my wrist and arm. I tied my shoe last time"    Currently in Pain? No/denies    Pain Score 0-No pain              NMR LUE with slight weight bearing into forearm and hand/wrist (sitting and standing on tall kneeling bench), forward reaching on physio ball in standing and sitting, hemi glide with LLE step for forward reaching in standing with no LOB and SBA, carpal mobilizations, low level reaching with LUE with resistance clothespins with mod  difficulty.      OCCUPATIONAL THERAPY DISCHARGE SUMMARY  Visits from Start of Care: 13  Current functional level related to goals / functional outcomes: Pt has progressed with LUE neuromuscular reeducation, functional use and strength and coordination and increasing independence with ADLs and IADLs.   Remaining deficits: Residual weakness and decreased coordination with LUE.   Education / Equipment: HEPs for NMR, ROM and strength and coordination with LUE.   Patient agrees to discharge. Patient goals were met. Patient is being discharged due to meeting the stated rehab goals..                    OT Short Term Goals - 09/09/21 1647       OT SHORT TERM GOAL #1   Title Patient will complete an HEP designed to improve PROM to LUE    Baseline No HEP    Time 4    Period Weeks    Status Achieved    Target Date 08/28/21      OT SHORT TERM GOAL #2   Title Patient will report no increase in pain in left arm with passive stretching, or HEP    Baseline Pain ranges up to 5-6/10    Time 4    Period Weeks    Status Achieved  OT SHORT TERM GOAL #3   Title Patient will demonstrate increase of three blocks in box and blocks test    Baseline 14    Time 4    Period Weeks    Status Achieved   19     OT SHORT TERM GOAL #4   Title Patient will demonstrate ability to stabilize a potato with left hand to peel with right hand    Baseline unable    Time 4    Period Weeks    Status Partially Met   simulated with tennis ball and peeler in clinic - pt reports using knife.     OT SHORT TERM GOAL #5   Title Patient will be able to cut food on his plate without assistance    Baseline Unable    Time 4    Period Weeks    Status Achieved   simulated in clinic with theraputty and fork and butter knife with good bimanual coordination              OT Long Term Goals - 09/09/21 1646       OT LONG TERM GOAL #1   Title Patient will complete updated HEP    Baseline No  HEP    Time 8    Period Weeks    Status Achieved      OT LONG TERM GOAL #2   Title Patient will utilize BUE to wash dishes x 5 min without dropping, or wrist pain    Baseline Unable    Time 8    Period Weeks    Status Achieved      OT LONG TERM GOAL #3   Title Patient will tie shoes using BUE    Baseline unable    Time 8    Period Weeks    Status Achieved      OT LONG TERM GOAL #4   Title Patient will demonstrate low level reach with LUE - shoulder flexion with elbow extension to grasp and release lightweight (less than 2lb) object    Baseline Unable to flex shoulder and extend elbow    Time 8    Period Weeks    Status Achieved      OT LONG TERM GOAL #5   Title Patient will complete 9 hole peg test with LUE in less than 57mn 45 sec    Baseline 2 min 20 sec    Time 8    Period Weeks    Status Achieved   LUE with brace on - 1 min 43 seconds                  Plan - 09/09/21 1649     Clinical Impression Statement Pt has progressed well and is ready for discharge. Pt has met all goals.    OT Occupational Profile and History Detailed Assessment- Review of Records and additional review of physical, cognitive, psychosocial history related to current functional performance    Occupational performance deficits (Please refer to evaluation for details): ADL's;IADL's;Work    Body Structure / Function / Physical Skills ADL;Dexterity;Flexibility;Muscle spasms;ROM;Strength;Tone;IADL;FMC;Edema;Coordination;Balance;Body mechanics;Decreased knowledge of precautions;Endurance;Pain;Sensation;UE functional use;GMC;Decreased knowledge of use of DME    Rehab Potential Good    Clinical Decision Making Several treatment options, min-mod task modification necessary    Comorbidities Affecting Occupational Performance: May have comorbidities impacting occupational performance    Modification or Assistance to Complete Evaluation  No modification of tasks or assist necessary to complete eval     OT  Frequency 2x / week    OT Duration 8 weeks    OT Treatment/Interventions Self-care/ADL training;Cryotherapy;Therapeutic exercise;DME and/or AE instruction;Balance training;Functional Mobility Training;Aquatic Therapy;Electrical Stimulation;Ultrasound;Fluidtherapy;Neuromuscular education;Manual Therapy;Splinting;Visual/perceptual remediation/compensation;Moist Heat;Passive range of motion;Therapeutic activities;Patient/family education    Plan OT discharge    Consulted and Agree with Plan of Care Patient             Patient will benefit from skilled therapeutic intervention in order to improve the following deficits and impairments:   Body Structure / Function / Physical Skills: ADL, Dexterity, Flexibility, Muscle spasms, ROM, Strength, Tone, IADL, FMC, Edema, Coordination, Balance, Body mechanics, Decreased knowledge of precautions, Endurance, Pain, Sensation, UE functional use, GMC, Decreased knowledge of use of DME       Visit Diagnosis: Stiffness of left elbow, not elsewhere classified  Stiffness of left hand, not elsewhere classified  Other lack of coordination  Chronic left shoulder pain  Muscle weakness (generalized)  Pain in left wrist  Stiffness of left shoulder, not elsewhere classified  Other disturbances of skin sensation    Problem List Patient Active Problem List   Diagnosis Date Noted   CKD (chronic kidney disease), stage III with proteinuria ([place]) 12/11/2020   Swelling of left hand 12/11/2020   Type 2 diabetes mellitus with diabetic nephropathy, without long-term current use of insulin (HCC)    Acute on chronic anemia    CVA (cerebral vascular accident) () 12/02/2020   Essential hypertension, benign 11/02/2018   Tobacco use disorder 11/02/2018   Bipolar disorder ([place]) 11/02/2018    Zachery Conch, OT/L 09/09/2021, 4:50 PM  [facility] [address] [place], [place],  [address] Phone: [phone]   Fax:  [phone]  Name: Rodney Rose MRN: [id number] Date of Birth: 1976/01/03

## 2021-09-16 ENCOUNTER — Ambulatory Visit: Payer: BLUE CROSS/BLUE SHIELD | Admitting: Adult Health

## 2021-09-16 ENCOUNTER — Telehealth: Payer: Self-pay | Admitting: Adult Health

## 2021-09-16 ENCOUNTER — Encounter: Payer: BLUE CROSS/BLUE SHIELD | Admitting: Occupational Therapy

## 2021-09-16 ENCOUNTER — Encounter: Payer: Self-pay | Admitting: Adult Health

## 2021-09-16 VITALS — BP 142/84 | HR 80 | Ht 73.0 in | Wt 187.0 lb

## 2021-09-16 DIAGNOSIS — I639 Cerebral infarction, unspecified: Secondary | ICD-10-CM | POA: Diagnosis not present

## 2021-09-16 DIAGNOSIS — I69354 Hemiplegia and hemiparesis following cerebral infarction affecting left non-dominant side: Secondary | ICD-10-CM | POA: Diagnosis not present

## 2021-09-16 MED ORDER — BACLOFEN 10 MG PO TABS: 5.0000 mg | ORAL_TABLET | Freq: Three times a day (TID) | ORAL | 5 refills | Status: DC | PRN

## 2021-09-16 MED ORDER — BACLOFEN 5 MG PO TABS: 5.0000 mg | ORAL_TABLET | Freq: Three times a day (TID) | ORAL | 5 refills | Status: DC | PRN

## 2021-09-16 NOTE — Addendum Note (Signed)
Addended by: Ihor Austin L on: 09/16/2021 02:13 PM   Modules accepted: Orders

## 2021-09-16 NOTE — Telephone Encounter (Signed)
Pt called wanting to inform the provider that when he went to get his Baclofen 5 MG TABS it was to expensive. Please advise.

## 2021-09-16 NOTE — Progress Notes (Signed)
[facility] [address] [place]. [place] [address] [phone]       OFFICE FOLLOW-UP NOTE  [name] Date of Birth:  23-Jan-1976 Medical Record Number:  [id number]   Primary neurologist: Dr. Leonie Man  Reason for visit: Stroke follow-up  Chief Complaint  Patient presents with   Follow-up    Rm 3 alone Pt is well and stable, finished therapy and weakness has slightly improved but not significantly        HPI:   Today, 09/16/2021, Rodney Rose returns for overdue stroke follow-up unaccompanied.  Overall stable.  He recently completed OT as he met all of stated ST and LT rehab goals with residual weakness and incoordination of LUE as well as spasticity. He notes some improvement since prior visit.  He questions if there is a medication that can help with his spasticity.  He has not yet returned back to work due to residual deficits but is eager to do so. Denies new stroke/TIA symptoms.  He was referred to oncology/hematology for continued elevated anticardiolipin antibodies and continued elevated kappa/lambda light chains elevated. Repeat labs by oncology 3/8 showed continued elevated anticardiolipin antibodies although improved from 44 down to 32, elevated kappa free light chain at 62 down from 69.9 and slightly elevated DRV VT. Per epic review, they recommended increasing aspirin dose from 81 mg to 325 mg daily but patient was not aware of this.  He has not yet had a return visit. He remains on aspirin 67m daily and atorvastatin 8103mdaily without side effects.  Reports lipid panel and Ac recently checking by kidney doctor which were good- unable to view via epic. Blood pressure today 142/84. [facility].  No further concerns at this time    History provided by for reference purposes only Initial visit 02/04/2021 Dr. SeLeonie ManMr. Rose a [age]ear old African-American male seen today for initial office  follow-up visit following hospital admission for stroke and December 2021.  History is obtained from the patient, review of electronic medical records and I personally reviewed pertinent imaging films in PACS.  He has past medical history of a bipolar disorder, schizophrenia, tobacco abuse and hypertension [facility] with left facial droop, slurred speech, weakness and imbalance since 4 days prior to admission.  He was washing dishes on 11/27/2020 and his arm felt funny and he had some trouble working but he went home symptoms gradually got worse prompting visit to the ED.  CT scan showed chronic infarct but no acute abnormalities but MRI scan showed a large acute right basal ganglia infarct with involving adjacent white matter and subacute right caudate head infarct as well.  His blood pressure significantly elevated in the ED with systolic being greater than 200.  2D echo showed normal ejection fraction without cardiac source of embolism.  Transesophageal echocardiogram was negative for clot or PFO.  TCD bubble study was negative.  Carotid ultrasound showed no significant extracranial stenosis.  MR angiogram of the brain and neck were both normal.  LDL cholesterol is elevated 171 mg percent hemoglobin A1c was 5.5.  Urine drug screen was negative.  Patient at 30-day external cardiac event monitor which was negative for cardiac arrhythmias. . Marland KitchenNA was negative sickle cell was negative ESR of 40 mm.  IgM antiphospholipid antibody was elevated at 40 and kappa and lambda light chains were elevated though serum protein electrophoresis did not show any  M protein. Patient denies any prior history of deep vein thrombosis, pulmonary embolism or any family history of blood clots or strokes or heart attacks at a young age.  Patient states that he still has left-sided weakness particular in his hand has grip weakness and diminished fine motor skills.  He also drags his left leg while walking.   He has not had any outpatient physical occupational therapy.  Is tolerating aspirin well without any side effects.  He continues to smoke has not quit.    ROS:   14 system review of systems is positive for those listed in HPI and all other systems negative  PMH:  Past Medical History:  Diagnosis Date   Bipolar disorder ([place])    Dr. Heriberto Antigua with [facility]   Chronic back pain    Hypertension 2017   Schizophrenia [facility])    Stroke ([place]) 11/2020    Social History:  Social History   Socioeconomic History   Marital status: Single    Spouse name: Not on file   Number of children: Not on file   Years of education: Not on file   Highest education level: Not on file  Occupational History   Not on file  Tobacco Use   Smoking status: Every Day    Packs/day: 0.50    Years: 6.00    Pack years: 3.00    Types: Cigarettes   Smokeless tobacco: Never  Vaping Use   Vaping Use: Never used  Substance and Sexual Activity   Alcohol use: No   Drug use: No   Sexual activity: Yes    Birth control/protection: Condom  Other Topics Concern   Not on file  Social History Narrative   Lives with mother and sister.     Right Handed   Drinks 2-4 cups caffeine daily   Social Determinants of Health   Financial Resource Strain: Not on file  Food Insecurity: Not on file  Transportation Needs: Not on file  Physical Activity: Not on file  Stress: Not on file  Social Connections: Not on file  Intimate Partner Violence: Not on file    Medications:   Current Outpatient Medications on File Prior to Visit  Medication Sig Dispense Refill   amLODipine (NORVASC) 10 MG tablet Take 1 tablet (10 mg total) by mouth daily. 60 tablet 3   aspirin 81 MG EC tablet Take 1 tablet (81 mg total) by mouth daily. Swallow whole. 30 tablet 11   atorvastatin (LIPITOR) 80 MG tablet Take 1 tablet (80 mg total) by mouth daily. 60 tablet 3   carvedilol (COREG) 12.5 MG tablet TAKE 1 TABLET (12.5 MG TOTAL) BY MOUTH 2  (TWO) TIMES DAILY WITH A MEAL. 180 tablet 1   FARXIGA 5 MG TABS tablet TAKE 1 TABLET BY MOUTH DAILY BEFORE BREAKFAST. 30 tablet 3   haloperidol (HALDOL) 5 MG tablet Take 1 tablet (5 mg total) by mouth at bedtime. 30 tablet 0   loperamide (IMODIUM) 2 MG capsule Take 1 capsule (2 mg total) by mouth 4 (four) times daily as needed for diarrhea or loose stools. 12 capsule 0   losartan-hydrochlorothiazide (HYZAAR) 100-25 MG tablet TAKE 1 TABLET BY MOUTH EVERY DAY 90 tablet 1   metFORMIN (GLUCOPHAGE) 1000 MG tablet Take 1 tablet (1,000 mg total) by mouth daily with breakfast. 30 tablet 11   ondansetron (ZOFRAN ODT) 8 MG disintegrating tablet Take 1 tablet (8 mg total) by mouth every 8 (eight) hours as needed for nausea or vomiting. 20 tablet  0   [DISCONTINUED] Azilsartan-Chlorthalidone 40-12.5 MG TABS Take 1 tablet by mouth daily. (Patient not taking: Reported on 04/09/2019) 30 tablet 1   [DISCONTINUED] glyBURIDE-metformin (GLUCOVANCE) 2.5-500 MG tablet Take 1 tablet by mouth 2 (two) times daily with a meal. 180 tablet 1   No current facility-administered medications on file prior to visit.    Allergies:   Allergies  Allergen Reactions   Penicillins Swelling    Physical Exam Today's Vitals   09/16/21 0838  BP: (!) 142/84  Pulse: 80  Weight: 187 lb (84.8 kg)  Height: '6\' 1"'  (1.854 m)   Body mass index is 24.67 kg/m.   General: well developed, well nourished very pleasant middle-aged African-American male, seated, in no evident distress Head: head normocephalic and atraumatic.  Neck: supple with no carotid or supraclavicular bruits Cardiovascular: regular rate and rhythm, no murmurs Musculoskeletal: no deformity Skin:  no rash/petichiae Vascular:  Normal pulses all extremities  Neurologic Exam Mental Status: Awake and fully alert.  Mild dysarthria.  Oriented to place and time. Recent and remote memory intact. Attention span, concentration and fund of knowledge appropriate. Mood and affect  appropriate.  Cranial Nerves: Fundoscopic exam reveals sharp disc margins. Pupils equal, briskly reactive to light. Extraocular movements full without nystagmus. Visual fields full to confrontation. Hearing intact. Facial sensation intact.  Mild left face weakness, tongue, palate moves normally and symmetrically.  Motor: Full strength and tone right upper and lower extremity LUE: 4/5 with limited finger flexion and grip and increased tone throughout LLE: 4+/5 ankle dorsiflexion Sensory.: intact to touch ,pinprick .position and vibratory sensation.  Coordination: Rapid alternating movements normal on right side. Finger-to-nose performed accurately RUE and heel-to-shin performed accurately bilaterally. Gait and Station: Arises from chair without difficulty. Stance is normal. Gait demonstrates decreased stride length and step height of LUE.  No assistive device use.  Reflexes: Brisk LUE otherwise 1+ and symmetric. Toes downgoing.      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  Vascular risk factors of diabetes, hypertension, smoking and hyperlipidemia.      R BG stroke, cryptogenic Residual deficit: Left hemiparesis with LUE spasticity.  Recommend starting baclofen 5 mg 3 times daily as needed.  Advised to call office after 1 week if no benefit for dosage increase or sooner if difficulty tolerating.  Discussed potential side effects and patient wishes to proceed.  May need to consider Botox in the future if indicated.  Encouraged continued HEP 30-day cardiac event monitor negative for atrial fibrillation.  He declined ILR placement Continue aspirin 81 mg daily and atorvastatin secondary stroke prevention HTN: BP goal<130/90.  Stable on amlodipine, losartan-hydrochlorothiazide and carvedilol per PCP HLD: LDL goal<70.  Continue atorvastatin 80 mg daily per PCP.  Reports recent lipid panel by nephrology which  was stable (unable to view via epic) DM: A1c goal<7.  On Farxiga and metformin per PCP.  Reports recent A1c by nephrology which was stable (unable to view via epic) Positive anticardiolipin antibody Evaluation by oncology/hematology 3/8 with repeat labs. Per OV note, it is not felt that he has anticardiolipin antibody syndrome.  Recommended for 35-monthfollow-up -advised to call their office to schedule follow-up visit.  He is also remained on aspirin 81 mg daily -advised to further discuss increasing aspirin dosage to 326mdaily as advised by hem/onc    Recommend follow-up in 4 months but due to insurance, [place] out of network and request referral to establish care  with [facility] - referral placed but advised to call office with any questions or concerns during the interval time     CC:  [place] provider: Dr. Clifton Custard, Charisse March, MD   I spent 38 minutes of face-to-face and non-face-to-face time with patient.  This included previsit chart review, lab review, study review, order entry, electronic health record documentation, patient education and discussion regarding prior stroke, secondary stroke prevention measures and aggressive stroke risk factor management, residual deficits and use of baclofen and answered all other questions to patient satisfaction  Frann Rider, AGNP-BC  [facility] [address] [place], [place] [address]  Phone [phone] Fax [phone] Note: This document was prepared with digital dictation and possible smart phrase technology. Any transcriptional errors that result from this process are unintentional.

## 2021-09-16 NOTE — Progress Notes (Signed)
I agree with the above plan 

## 2021-09-16 NOTE — Telephone Encounter (Addendum)
Advised by pharmacy pt picked up baclofen 20mg  from another provider.

## 2021-09-16 NOTE — Patient Instructions (Addendum)
Start baclofen 5mg  three times daily as needed. Would recommend first starting with 5mg  nightly then after a couple days, you can try to take one in the day time but can cause drowsiness/fatigue so please do this on a day you do not have to go anywhere. If any difficulty tolerating day time dose, you can try to take 2 tablets (10mg  total) at bedtime. After a week if not benefit, please let me know so we can adjust the dose  Continue doing exercises at home for hopeful ongoing recovery  Please contact hematologist/oncologist to schedule follow up visit at [phone]  Continue aspirin 81 mg daily  and atorvastatin 80mg  daily for secondary stroke prevention  Continue to follow up with PCP regarding cholesterol and blood pressure management  Maintain strict control of hypertension with blood pressure goal below 130/90, diabetes with A1c goal<7, and cholesterol with LDL cholesterol (bad cholesterol) goal below 70 mg/dL.       Followup in the future with me in 4 months or call earlier if needed       Thank you for coming to see at [facility]. I hope we have been able to provide you high quality care today.  You may receive a patient satisfaction survey over the next few weeks. We would appreciate your feedback and comments so that we may continue to improve ourselves and the health of our patients.

## 2021-09-22 ENCOUNTER — Telehealth: Payer: Self-pay | Admitting: Adult Health

## 2021-09-22 NOTE — Telephone Encounter (Signed)
Referral sent to [facility]. Phone: [phone]. Fax: [phone].

## 2021-09-30 ENCOUNTER — Other Ambulatory Visit: Payer: Self-pay | Admitting: Internal Medicine

## 2021-09-30 DIAGNOSIS — I10 Essential (primary) hypertension: Secondary | ICD-10-CM

## 2021-11-04 DIAGNOSIS — Z0271 Encounter for disability determination: Secondary | ICD-10-CM

## 2021-11-20 ENCOUNTER — Other Ambulatory Visit: Payer: Self-pay | Admitting: Internal Medicine

## 2021-11-20 DIAGNOSIS — E119 Type 2 diabetes mellitus without complications: Secondary | ICD-10-CM

## 2021-11-24 ENCOUNTER — Other Ambulatory Visit: Payer: Self-pay | Admitting: Student

## 2021-12-07 ENCOUNTER — Other Ambulatory Visit: Payer: Self-pay

## 2021-12-07 DIAGNOSIS — E119 Type 2 diabetes mellitus without complications: Secondary | ICD-10-CM

## 2021-12-14 MED ORDER — DAPAGLIFLOZIN PROPANEDIOL 5 MG PO TABS: 5.0000 mg | ORAL_TABLET | Freq: Every day | ORAL | 3 refills | Status: AC

## 2021-12-14 NOTE — Telephone Encounter (Signed)
Front desk, please call patient to schedule a follow up appt.

## 2022-01-06 ENCOUNTER — Encounter: Payer: Self-pay | Admitting: Adult Health

## 2022-01-06 ENCOUNTER — Ambulatory Visit (INDEPENDENT_AMBULATORY_CARE_PROVIDER_SITE_OTHER): Payer: BLUE CROSS/BLUE SHIELD | Admitting: Adult Health

## 2022-01-06 VITALS — BP 138/79 | HR 89 | Ht 73.0 in | Wt 187.0 lb

## 2022-01-06 DIAGNOSIS — E785 Hyperlipidemia, unspecified: Secondary | ICD-10-CM | POA: Diagnosis not present

## 2022-01-06 DIAGNOSIS — I639 Cerebral infarction, unspecified: Secondary | ICD-10-CM

## 2022-01-06 MED ORDER — ASPIRIN EC 325 MG PO TBEC: 325.0000 mg | DELAYED_RELEASE_TABLET | Freq: Every day | ORAL | 0 refills | Status: AC

## 2022-01-06 NOTE — Progress Notes (Signed)
[facility] [address] [place]. [place] [address] [phone]       OFFICE FOLLOW-UP NOTE  Rodney Rose Calender Date of Birth:  1976/03/14 Medical Record Number:  [id number]   Primary neurologist: Dr. Leonie Man  Reason for visit: Stroke follow-up  Chief Complaint  Patient presents with   Follow-up    RM 2 alone Pt is well and stable, no new concerns      HPI:   Update, 01/06/2022 JM: [name] is here today for a stroke follow-up unaccompanied. Continued LUE weakness with spasticity/pain and incoordination although some improvement since prior visit, on baclofen 54m TID with benefit and no side effects. Seen by ortho for left hand pain - EMG/NCV showed cubital tunnel syndrome - has f/u with ortho next week to discuss release procedure.  Denies new stroke/TIA symptoms. Therapy completed. Has not returned to work. Remains on aspirin 45mand lipitor without side effects. BP today 138/79. Labs A1c 6.8 (10/24/2021), LDL 171 (12/03/20).  Continued tobacco use - has appt in March for smoking cessation.  No new concerns at this time    History provided for reference purposes only Update 09/16/2021 JM: [name] for overdue stroke follow-up unaccompanied.  Overall stable.  He recently completed OT as he met all of stated ST and LT rehab goals with residual weakness and incoordination of LUE as well as spasticity. He notes some improvement since prior visit.  He questions if there is a medication that can help with his spasticity.  He has not yet returned back to work due to residual deficits but is eager to do so. Denies new stroke/TIA symptoms.  He was referred to oncology/hematology for continued elevated anticardiolipin antibodies and continued elevated kappa/lambda light chains elevated. Repeat labs by oncology 3/8 showed continued elevated anticardiolipin antibodies although improved from 44 down to 32, elevated kappa free light chain at 62 down from 69.9 and  slightly elevated DRV VT. Per epic review, they recommended increasing aspirin dose from 81 mg to 325 mg daily but patient was not aware of this.  He has not yet had a return visit. He remains on aspirin 4570maily and atorvastatin 45m49mily without side effects.  Reports lipid panel and Ac recently checking by kidney doctor which were good- unable to view via epic. Blood pressure today 142/84. [facility] has been stable.  No further concerns at this time  Initial visit 02/04/2021 Dr. SethLeonie Man. WingGaymona 46 y74r old African-American male seen today for initial office follow-up visit following hospital admission for stroke and December 2021.  History is obtained from the patient, review of electronic medical records and I personally reviewed pertinent imaging films in PACS.  He has past medical history of a bipolar disorder, schizophrenia, tobacco abuse and hypertension who [facility] to [facility] with left facial droop, slurred speech, weakness and imbalance since 4 days prior to admission.  He was washing dishes on 11/27/2020 and his arm felt funny and he had some trouble working but he went home symptoms gradually got worse prompting visit to the ED.  CT scan showed chronic infarct but no acute abnormalities but MRI scan showed a large acute right basal ganglia infarct with involving adjacent white matter and subacute right caudate head infarct as well.  His blood pressure significantly elevated in the ED with systolic being greater than 200.  2D echo showed normal ejection fraction without cardiac source of embolism.  Transesophageal echocardiogram was negative for clot or PFO.  TCD bubble study was negative.  Carotid ultrasound showed no significant extracranial stenosis.  MR angiogram of the brain and neck were both normal.  LDL cholesterol is elevated 171 mg percent hemoglobin A1c was 5.5.  Urine drug screen was negative.  Patient at 30-day  external cardiac event monitor which was negative for cardiac arrhythmias. [name] ANA was negative sickle cell was negative ESR of 40 mm.  IgM antiphospholipid antibody was elevated at 40 and kappa and lambda light chains were elevated though serum protein electrophoresis did not show any M protein. Patient denies any prior history of deep vein thrombosis, pulmonary embolism or any family history of blood clots or strokes or heart attacks at a young age.  Patient states that he still has left-sided weakness particular in his hand has grip weakness and diminished fine motor skills.  He also drags his left leg while walking.  He has not had any outpatient physical occupational therapy.  Is tolerating aspirin well without any side effects.  He continues to smoke has not quit.    ROS:   14 system review of systems is positive for those listed in HPI and all other systems negative  PMH:  Past Medical History:  Diagnosis Date   Bipolar disorder ([place])    Dr. Heriberto Rose with [facility]   Chronic back pain    Hypertension 2017   Schizophrenia [facility])    Stroke ([place]) 11/2020    Social History:  Social History   Socioeconomic History   Marital status: Single    Spouse name: Not on file   Number of children: Not on file   Years of education: Not on file   Highest education level: Not on file  Occupational History   Not on file  Tobacco Use   Smoking status: Every Day    Packs/day: 0.50    Years: 6.00    Pack years: 3.00    Types: Cigarettes   Smokeless tobacco: Never  Vaping Use   Vaping Use: Never used  Substance and Sexual Activity   Alcohol use: No   Drug use: No   Sexual activity: Yes    Birth control/protection: Condom  Other Topics Concern   Not on file  Social History Narrative   Lives with mother and sister.     Right Handed   Drinks 2-4 cups caffeine daily   Social Determinants of Health   Financial Resource Strain: Not on file  Food Insecurity: Not on file   Transportation Needs: Not on file  Physical Activity: Not on file  Stress: Not on file  Social Connections: Not on file  Intimate Partner Violence: Not on file    Medications:   Current Outpatient Medications on File Prior to Visit  Medication Sig Dispense Refill   amLODipine (NORVASC) 10 MG tablet TAKE 1 TABLET BY MOUTH EVERY DAY 90 tablet 2   aspirin 81 MG EC tablet Take 1 tablet (81 mg total) by mouth daily. Swallow whole. 30 tablet 11   atorvastatin (LIPITOR) 80 MG tablet TAKE 1 TABLET BY MOUTH EVERY DAY 90 tablet 2   baclofen (LIORESAL) 10 MG tablet Take 0.5 tablets (5 mg total) by mouth 3 (three) times daily as needed for muscle spasms. 45 each 5   carvedilol (COREG) 12.5 MG tablet Take 1 tablet (12.5 mg total) by mouth 2 (two) times daily with a meal. 180 tablet 3   dapagliflozin propanediol (FARXIGA) 5 MG TABS tablet Take  1 tablet (5 mg total) by mouth daily before breakfast. 90 tablet 3   haloperidol (HALDOL) 5 MG tablet Take 1 tablet (5 mg total) by mouth at bedtime. 30 tablet 0   losartan-hydrochlorothiazide (HYZAAR) 100-25 MG tablet TAKE 1 TABLET BY MOUTH EVERY DAY 90 tablet 1   metFORMIN (GLUCOPHAGE) 1000 MG tablet Take 1 tablet (1,000 mg total) by mouth daily with breakfast. 30 tablet 11   [DISCONTINUED] Azilsartan-Chlorthalidone 40-12.5 MG TABS Take 1 tablet by mouth daily. (Patient not taking: Reported on 04/09/2019) 30 tablet 1   [DISCONTINUED] glyBURIDE-metformin (GLUCOVANCE) 2.5-500 MG tablet Take 1 tablet by mouth 2 (two) times daily with a meal. 180 tablet 1   No current facility-administered medications on file prior to visit.    Allergies:   Allergies  Allergen Reactions   Penicillins Swelling    Physical Exam Today's Vitals   01/06/22 1311  BP: 138/79  Pulse: 89  Weight: 187 lb (84.8 kg)  Height: _0  (1.854 m)    Body mass index is 24.67 kg/m.   General: well developed, well nourished very pleasant middle-aged African-American male, seated, in no  evident distress Head: head normocephalic and atraumatic.  Neck: supple with no carotid or supraclavicular bruits Cardiovascular: regular rate and rhythm, no murmurs Musculoskeletal: no deformity Skin:  no rash/petichiae Vascular:  Normal pulses all extremities  Neurologic Exam Mental Status: Awake and fully alert.  Mild dysarthria although poor denture noted.  Oriented to place and time. Recent and remote memory intact. Attention span, concentration and fund of knowledge appropriate. Mood and affect appropriate.  Cranial Nerves: Pupils equal, briskly reactive to light. Extraocular movements full without nystagmus. Visual fields full to confrontation. Hearing intact. Facial sensation intact.  Mild left face weakness, tongue, palate moves normally and symmetrically.  Motor: Full strength and tone right upper and lower extremity LUE: 4/5 with limited finger flexion and grip and increased tone throughout LLE: 4+/5 ankle dorsiflexion otherwise 5/5 Sensory.: intact to touch ,pinprick .position and vibratory sensation.  Coordination: Rapid alternating movements normal on right side. Finger-to-nose performed accurately RUE and heel-to-shin performed accurately bilaterally. Gait and Station: Arises from chair without difficulty. Stance is normal. Gait demonstrates decreased stride length and step height of LUE.  No assistive device use.  Tandem walk and heel toe not attempted Reflexes: Brisk LUE otherwise 1+ and symmetric. Toes downgoing.      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  Vascular risk factors of diabetes, hypertension, smoking and hyperlipidemia.      R BG stroke, cryptogenic Residual deficit: Left hemiparesis with LUE spasticity.  Continue baclofen 5 mg 3 times daily as needed. Recent diagnosis of cubital tunnel syndrome by ortho, follow up this month for possible release. 30-day cardiac  event monitor negative for atrial fibrillation.  He declined ILR placement Continue aspirin and atorvastatin secondary stroke prevention HTN: BP goal<130/90.  Stable on amlodipine, losartan-hydrochlorothiazide and carvedilol per PCP HLD: LDL goal<70.  Continue atorvastatin 80 mg daily per PCP. No recent lipid panel, will check today. DM: A1c goal<7.  Recent A1c 6.8.  On Farxiga and metformin per PCP.  Tobacco use: current smoker, planned visit in March for smoking cessation at [facility]  Abnormal hypercoagulable labs Evaluation by oncology/hematology 3/8 with repeat labs. Not felt that he has anticardiolipin antibody syndrome.  Has not yet followed up with hematology - advised to increase aspirin dosage from 43m to 3275mdaily as advised by hem/onc. Ensure f/u  visit as recommended by hematology.    Needs to establish care with [facility] neurology due to insurance -previously placed referral but visit not yet scheduled. Will further look into this.      CC:  Lacinda Axon, MD   I spent 38 minutes of face-to-face and non-face-to-face time with patient.  This included previsit chart review, lab review, study review, order entry, electronic health record documentation, patient education and discussion regarding prior stroke, secondary stroke prevention measures and aggressive stroke risk factor management, residual deficits and use of baclofen and eval by ortho and answered all other questions to patient satisfaction  Frann Rider, AGNP-BC  [facility] [address] [place], [place] [address]  Phone [phone] Fax [phone] Note: This document was prepared with digital dictation and possible smart phrase technology. Any transcriptional errors that result from this process are unintentional.

## 2022-01-06 NOTE — Patient Instructions (Signed)
Increase aspirin to aspirin 325 mg daily  and continue atorvastatin 80mg  daily  for secondary stroke prevention  Continue to follow up with PCP regarding cholesterol, blood pressure and diabetes management  Maintain strict control of hypertension with blood pressure goal below 130/90, diabetes with hemoglobin A1c goal below 7.0 % and cholesterol with LDL cholesterol (bad cholesterol) goal below 70 mg/dL.   Signs of a Stroke? Follow the BEFAST method:  Balance Watch for a sudden loss of balance, trouble with coordination or vertigo Eyes Is there a sudden loss of vision in one or both eyes? Or double vision?  Face: Ask the person to smile. Does one side of the face droop or is it numb?  Arms: Ask the person to raise both arms. Does one arm drift downward? Is there weakness or numbness of a leg? Speech: Ask the person to repeat a simple phrase. Does the speech sound slurred/strange? Is the person confused ? Time: If you observe any of these signs, call 911.        Thank you for coming to see at [facility]. I hope we have been able to provide you high quality care today.  You may receive a patient satisfaction survey over the next few weeks. We would appreciate your feedback and comments so that we may continue to improve ourselves and the health of our patients.

## 2022-01-07 ENCOUNTER — Telehealth: Payer: Self-pay

## 2022-01-07 LAB — LIPID PANEL
Chol/HDL Ratio: 3.8 ratio (ref 0.0–5.0)
Cholesterol, Total: 92 mg/dL — ABNORMAL LOW (ref 100–199)
HDL: 24 mg/dL — ABNORMAL LOW (ref >39)
LDL Chol Calc (NIH): 46 mg/dL (ref 0–99)
Triglycerides: 116 mg/dL (ref 0–149)
VLDL Cholesterol Cal: 22 mg/dL (ref 5–40)

## 2022-01-07 NOTE — Telephone Encounter (Signed)
-----   Message from Ihor Austin, NP sent at 01/07/2022  9:53 AM EST ----- Please advise recent cholesterol levels looked excellent. Please continue current treatment plan. Thank you

## 2022-01-07 NOTE — Telephone Encounter (Signed)
Contacted pt, informed him recent cholesterol levels looked excellent and to continue current plan, he was appreciative.

## 2022-01-10 ENCOUNTER — Other Ambulatory Visit: Payer: Self-pay | Admitting: Student

## 2022-01-10 DIAGNOSIS — I10 Essential (primary) hypertension: Secondary | ICD-10-CM

## 2022-01-11 ENCOUNTER — Telehealth: Payer: Self-pay | Admitting: Adult Health

## 2022-01-11 NOTE — Telephone Encounter (Signed)
I called pt and relayed plan from Jessica's office visit on 01/06/22: Tobacco use: current smoker, planned visit in March for smoking cessation at [facility]  Pt was advised specific medication was not discussed about being prescribed by our office but he could further discuss with PCP or in smoking cessation class. Pt verbalized understanding/agreement to the plan.

## 2022-01-11 NOTE — Telephone Encounter (Signed)
Pt asking for name of medication discussed last office visit to help stop smoking. Would like a call from the nurse.

## 2022-01-14 ENCOUNTER — Telehealth: Payer: Self-pay | Admitting: Adult Health

## 2022-01-14 NOTE — Telephone Encounter (Signed)
Contacted pt back, informed him he will just need to give them a call and get them to send [place] a clearance form to fill out and sign. Pt understood and was appreciative.

## 2022-01-14 NOTE — Telephone Encounter (Signed)
Pt would like a call from the nurse to discuss having arm surgery, surgeon will not do the surgery unless speak with Janett Billow, NP. Surgeon need approval from [place], NP. Would like a call from the nurse.

## 2022-01-19 ENCOUNTER — Telehealth: Payer: Self-pay | Admitting: Adult Health

## 2022-01-19 NOTE — Telephone Encounter (Signed)
I called the pt back after discussing call with Shanda Bumps.  Pt was advised to follow the recommendation of Dr. Kallie Edward and if he would like a second opinion Dr. Kallie Edward could set this up ( see documentation from 01/12/22 for reference on this).  Pt verbalized understanding and had no other questions/concerns at this time.

## 2022-01-19 NOTE — Telephone Encounter (Signed)
Pt is requesting a call back regarding recommendations for a different surgeon as the surgeon he was referred to is unable perform the surgery. He also has questions regarding clearance for surgery. Please advise.

## 2022-03-01 ENCOUNTER — Other Ambulatory Visit: Payer: Self-pay | Admitting: Internal Medicine

## 2022-03-01 DIAGNOSIS — E119 Type 2 diabetes mellitus without complications: Secondary | ICD-10-CM

## 2022-04-04 ENCOUNTER — Ambulatory Visit (HOSPITAL_COMMUNITY)
Admission: EM | Admit: 2022-04-04 | Discharge: 2022-04-04 | Disposition: A | Discharge status: Home / Self Care | Payer: BLUE CROSS/BLUE SHIELD | Attending: Nurse Practitioner | Admitting: Nurse Practitioner

## 2022-04-04 ENCOUNTER — Encounter (HOSPITAL_COMMUNITY): Payer: Self-pay

## 2022-04-04 DIAGNOSIS — A084 Viral intestinal infection, unspecified: Secondary | ICD-10-CM

## 2022-04-04 DIAGNOSIS — R112 Nausea with vomiting, unspecified: Secondary | ICD-10-CM

## 2022-04-04 MED ORDER — ONDANSETRON 4 MG PO TBDP: 4.0000 mg | ORAL_TABLET | Freq: Three times a day (TID) | ORAL | 0 refills | Status: DC | PRN

## 2022-04-04 MED ORDER — LOPERAMIDE HCL 2 MG PO CAPS: 2.0000 mg | ORAL_CAPSULE | Freq: Four times a day (QID) | ORAL | 0 refills | Status: DC | PRN

## 2022-04-04 NOTE — Discharge Instructions (Addendum)
-   I have sent refills of the medication to your pharmacy; please use these sparingly for nausea and diarrhea ?- Please make sure you are drinking plenty of fluids - water and sugar free electrolyte drinks are best.  ?- Please seek care if your symptoms worsen or persist with your primary care provider or the Emergency Room if your symptoms are severe. ?

## 2022-04-04 NOTE — ED Provider Notes (Signed)
?[place] ? ? ? ?CSN: [id number] ?Arrival date & time: 04/04/22  1124 ? ? ?  ? ?History   ?Chief Complaint ?Chief Complaint  ?Patient presents with  ? Nausea  ? Emesis  ? Diarrhea  ? Medication Refill  ? ? ?HPI ?Rodney Rose is a 46 y.o. male.  ? ?Patient reports 1 to 2-week history of nausea, vomiting, and diarrhea.  He feels a little lightheaded when he changes positions.  He did eat out at a store prior to the symptoms starting.  Today, he denies any fevers, body aches, chills, blood in his vomit or stool, dysuria, urinary frequency, blood in his urine.  He denies decreased appetite, unexplained weight loss, shortness of breath, chest pain, and night sweats.  He reports similar symptoms that occurred about 1 year ago and is requesting refills of antidiarrheal and nausea medication.  He does not take these medications daily, however does use them as needed.  He reports he has not vomited in 3 days and has not had diarrhea in 3 days.  He denies any nausea at the present time.  He has been eating and drinking and has not thrown up the past couple of days. ? ?Patient has a history of bipolar disorder, cerebrovascular accident with left-sided deficit, chronic kidney disease, type 2 diabetes, and hypertension.  He is taking his chronic medication daily as prescribed per his report. ? ? ? ?Past Medical History:  ?Diagnosis Date  ? Bipolar disorder ([place])   ? Dr. Heriberto Antigua with [facility]iat[facility]  ? Chronic back pain   ? Hypertension 2017  ? Schizophrenia ([place])   ? Stroke [facility]) 11/2020  ? ? ?Patient Active Problem List  ? Diagnosis Date Noted  ? CKD (chronic kidney disease), stage III with proteinuria ([place]) 12/11/2020  ? Swelling of left hand 12/11/2020  ? Type 2 diabetes mellitus with diabetic nephropathy, without long-term current use of insulin ([place])   ? Acute on chronic anemia   ? CVA (cerebral vascular accident) ([place]) 12/02/2020  ? Essential hypertension, benign 11/02/2018  ? Tobacco use disorder  11/02/2018  ? Bipolar disorder ([place]) 11/02/2018  ? ? ?Past Surgical History:  ?Procedure Laterality Date  ? BUBBLE STUDY  12/05/2020  ? Procedure: BUBBLE STUDY;  Surgeon: Josue Hector, MD;  Location: [facility] ENDOSCOPY;  Service: Cardiovascular;;  ? NO PAST SURGERIES  10/2018  ? TEE WITHOUT CARDIOVERSION N/A 12/05/2020  ? Procedure: TRANSESOPHAGEAL ECHOCARDIOGRAM (TEE);  Surgeon: Josue Hector, MD;  Location: [facility] ENDOSCOPY;  Service: Cardiovascular;  Laterality: N/A;  ? ? ? ? ? ?Home Medications   ? ?Prior to Admission medications   ?Medication Sig Start Date End Date Taking? Authorizing Provider  ?loperamide (IMODIUM) 2 MG capsule Take 1 capsule (2 mg total) by mouth 4 (four) times daily as needed for diarrhea or loose stools. 04/04/22  Yes Eulogio Bear, NP  ?losartan-hydrochlorothiazide (HYZAAR) 100-25 MG tablet TAKE 1 TABLET BY MOUTH EVERY DAY 01/12/22   Lacinda Axon, MD  ?ondansetron (ZOFRAN-ODT) 4 MG disintegrating tablet Take 1 tablet (4 mg total) by mouth every 8 (eight) hours as needed for nausea or vomiting. 04/04/22  Yes Noemi Chapel A, NP  ?amLODipine (NORVASC) 10 MG tablet TAKE 1 TABLET BY MOUTH EVERY DAY 10/01/21   Lacinda Axon, MD  ?aspirin EC 325 MG tablet Take 1 tablet (325 mg total) by mouth daily. 01/06/22   Frann Rider, NP  ?atorvastatin (LIPITOR) 80 MG tablet TAKE 1 TABLET BY MOUTH EVERY DAY  11/24/21   Lacinda Axon, MD  ?baclofen (LIORESAL) 10 MG tablet Take 0.5 tablets (5 mg total) by mouth 3 (three) times daily as needed for muscle spasms. 09/16/21   Frann Rider, NP  ?carvedilol (COREG) 12.5 MG tablet Take 1 tablet (12.5 mg total) by mouth 2 (two) times daily with a meal. 11/24/21   A[place], Charisse March, MD  ?dapagliflozin propanediol (FARXIGA) 5 MG TABS tablet Take 1 tablet (5 mg total) by mouth daily before breakfast. 12/14/21   Lacinda Axon, MD  ?haloperidol (HALDOL) 5 MG tablet Take 1 tablet (5 mg total) by mouth at bedtime. 07/18/20 01/06/22  Hampton Abbot, MD  ?metFORMIN (GLUCOPHAGE) 1000 MG tablet TAKE 1 TABLET BY MOUTH EVERY DAY WITH BREAKFAST 03/03/22   Lacinda Axon, MD  ?Azilsartan-Chlorthalidone 40-12.5 MG TABS Take 1 tablet by mouth daily. ?Patient not taking: Reported on 04/09/2019 11/02/18 01/23/20  Tysinger, Camelia Eng, PA-C  ?glyBURIDE-metformin (GLUCOVANCE) 2.5-500 MG tablet Take 1 tablet by mouth 2 (two) times daily with a meal. 04/11/19 03/29/20  Tysinger, Camelia Eng, PA-C  ? ? ?Family History ?Family History  ?Problem Relation Age of Onset  ? Hypertension Mother   ? COPD Father   ? Asthma Sister   ? Heart disease Neg Hx   ? Stroke Neg Hx   ? Diabetes Neg Hx   ? ? ?Social History ?Social History  ? ?Tobacco Use  ? Smoking status: Every Day  ?  Packs/day: 0.50  ?  Years: 6.00  ?  Pack years: 3.00  ?  Types: Cigarettes  ? Smokeless tobacco: Never  ?Vaping Use  ? Vaping Use: Never used  ?Substance Use Topics  ? Alcohol use: No  ? Drug use: No  ? ? ? ?Allergies   ?Penicillins ? ? ?Review of Systems ?Review of Systems ?Per HPI ? ?Physical Exam ?Triage Vital Signs ?ED Triage Vitals  ?Enc Vitals Group  ?   BP 04/04/22 1141 (!) 146/80  ?   Pulse Rate 04/04/22 1141 76  ?   Resp 04/04/22 1141 18  ?   Temp 04/04/22 1141 98.5 ?F (36.9 ?C)  ?   Temp Source 04/04/22 1141 Oral  ?   SpO2 04/04/22 1141 98 %  ?   Weight --   ?   Height --   ?   Head Circumference --   ?   Peak Flow --   ?   Pain Score 04/04/22 1140 2  ?   Pain Loc --   ?   Pain Edu? --   ?   Excl. in [place]? --   ? ?No data found. ? ?Updated Vital Signs ?BP (!) 146/80 (BP Location: Left Arm)   Pulse 76   Temp 98.5 ?F (36.9 ?C) (Oral)   Resp 18   SpO2 98%  ? ?Visual Acuity ?Right Eye Distance:   ?Left Eye Distance:   ?Bilateral Distance:   ? ?Right Eye Near:   ?Left Eye Near:    ?Bilateral Near:    ? ?Physical Exam ?Vitals and nursing note reviewed.  ?Constitutional:   ?   General: He is not in acute distress. ?   Appearance: Normal appearance. He is not toxic-appearing.  ?HENT:  ?   Head: Normocephalic  and atraumatic.  ?   Mouth/Throat:  ?   Mouth: Mucous membranes are moist.  ?   Pharynx: Oropharynx is clear. No posterior oropharyngeal erythema.  ?Cardiovascular:  ?   Rate and Rhythm: Normal rate and regular  rhythm.  ?Pulmonary:  ?   Effort: Pulmonary effort is normal. No respiratory distress.  ?   Breath sounds: Normal breath sounds. No wheezing, rhonchi or rales.  ?Abdominal:  ?   General: Abdomen is flat. Bowel sounds are normal. There is no distension.  ?   Palpations: Abdomen is soft.  ?   Tenderness: There is no abdominal tenderness. There is no right CVA tenderness, left CVA tenderness, guarding or rebound.  ?Musculoskeletal:  ?   Cervical back: Normal range of motion.  ?Lymphadenopathy:  ?   Cervical: No cervical adenopathy.  ?Skin: ?   General: Skin is warm and dry.  ?   Capillary Refill: Capillary refill takes less than 2 seconds.  ?   Coloration: Skin is not jaundiced or pale.  ?   Findings: No erythema.  ?Neurological:  ?   Mental Status: He is alert.  ?   Gait: Gait abnormal.  ?   Comments: Left arm paralysis - baseline per patient with history of stroke  ?Psychiatric:     ?   Behavior: Behavior is cooperative.  ? ? ? ?UC Treatments / Results  ?Labs ?(all labs ordered are listed, but only abnormal results are displayed) ?Labs Reviewed - No data to display ? ?EKG ? ? ?Radiology ?No results found. ? ?Procedures ?Procedures (including critical care time) ? ?Medications Ordered in UC ?Medications - No data to display ? ?Initial Impression / Assessment and Plan / UC Course  ?I have reviewed the triage vital signs and the nursing notes. ? ?Pertinent labs & imaging results that were available during my care of the patient were reviewed by me and considered in my medical decision making (see chart for details). ? ?  ?Symptoms and exam are consistent with viral gastroenteritis.  Vital signs are stable today and do not show signs of dehydration or compensation.  Will give refills of loperamide and ondansetron  that he can use as needed for diarrhea and nausea respectively.  Most recent EKG does not show QTc prolongation.  Encouraged plenty of hydration with water and sugar-free electrolyte drinks.  Encourage soft diet for the

## 2022-04-04 NOTE — ED Triage Notes (Signed)
Pt presents with c/o n/v/d. Pt states he ran out of his medications and needs a refill.  ?

## 2022-04-08 ENCOUNTER — Encounter (HOSPITAL_COMMUNITY): Payer: Self-pay

## 2022-04-08 ENCOUNTER — Ambulatory Visit (HOSPITAL_COMMUNITY)
Admission: EM | Admit: 2022-04-08 | Discharge: 2022-04-08 | Disposition: A | Discharge status: Home / Self Care | Payer: BLUE CROSS/BLUE SHIELD | Attending: Emergency Medicine | Admitting: Emergency Medicine

## 2022-04-08 DIAGNOSIS — R112 Nausea with vomiting, unspecified: Secondary | ICD-10-CM | POA: Diagnosis not present

## 2022-04-08 MED ORDER — METOCLOPRAMIDE HCL 10 MG PO TABS: 10.0000 mg | ORAL_TABLET | Freq: Four times a day (QID) | ORAL | 0 refills | Status: DC

## 2022-04-08 NOTE — Discharge Instructions (Addendum)
You have several medications which could be causing your ongoing nausea and vomiting symptoms.  Follow-up with your primary care provider at [facility] for advice regarding your multiple medications.  Trial of Reglan instead of Zofran.  Go to the emergency room if new or worsening symptoms at any time for stat lab work and further diagnostics. ?

## 2022-04-08 NOTE — ED Provider Notes (Signed)
?[facility] ? ? ?MRN: [id number] DOB: 22-Feb-1976 ? ?Subjective:  ? ?Chief Complaint;  ?Chief Complaint  ?Patient presents with  ? Emesis  ?Pt presents with emesis. Pt states he was previously seen for this. States he is not getting better. States zofran is not helping ? ?Rodney Rose is a 46 y.o. male presenting for ongoing nausea with vomiting once every third day for the last month.  He did have a new start of anti smoking medication which was his newest medication.  He denies fever or significant abdominal pain ? ?No current facility-administered medications for this encounter. ? ?Current Outpatient Medications:  ?  losartan-hydrochlorothiazide (HYZAAR) 100-25 MG tablet, TAKE 1 TABLET BY MOUTH EVERY DAY, Disp: 90 tablet, Rfl: 3 ?  metoCLOPramide (REGLAN) 10 MG tablet, Take 1 tablet (10 mg total) by mouth every 6 (six) hours., Disp: 30 tablet, Rfl: 0 ?  amLODipine (NORVASC) 10 MG tablet, TAKE 1 TABLET BY MOUTH EVERY DAY, Disp: 90 tablet, Rfl: 2 ?  aspirin EC 325 MG tablet, Take 1 tablet (325 mg total) by mouth daily., Disp: 30 tablet, Rfl: 0 ?  atorvastatin (LIPITOR) 80 MG tablet, TAKE 1 TABLET BY MOUTH EVERY DAY, Disp: 90 tablet, Rfl: 2 ?  baclofen (LIORESAL) 10 MG tablet, Take 0.5 tablets (5 mg total) by mouth 3 (three) times daily as needed for muscle spasms., Disp: 45 each, Rfl: 5 ?  carvedilol (COREG) 12.5 MG tablet, Take 1 tablet (12.5 mg total) by mouth 2 (two) times daily with a meal., Disp: 180 tablet, Rfl: 3 ?  dapagliflozin propanediol (FARXIGA) 5 MG TABS tablet, Take 1 tablet (5 mg total) by mouth daily before breakfast., Disp: 90 tablet, Rfl: 3 ?  haloperidol (HALDOL) 5 MG tablet, Take 1 tablet (5 mg total) by mouth at bedtime., Disp: 30 tablet, Rfl: 0 ?  loperamide (IMODIUM) 2 MG capsule, Take 1 capsule (2 mg total) by mouth 4 (four) times daily as needed for diarrhea or loose stools., Disp: 12 capsule, Rfl: 0 ?  metFORMIN (GLUCOPHAGE) 1000 MG tablet, TAKE 1 TABLET BY MOUTH  EVERY DAY WITH BREAKFAST, Disp: 90 tablet, Rfl: 3 ?  ondansetron (ZOFRAN-ODT) 4 MG disintegrating tablet, Take 1 tablet (4 mg total) by mouth every 8 (eight) hours as needed for nausea or vomiting., Disp: 20 tablet, Rfl: 0  ? ?Allergies  ?Allergen Reactions  ? Penicillins Swelling  ? ? ?Past Medical History:  ?Diagnosis Date  ? Bipolar disorder (HCC)   ? Dr. Georgia Lopes with [facility]  ? Chronic back pain   ? Hypertension 2017  ? Schizophrenia (HCC)   ? Stroke [facility]) 11/2020  ?  ? ?Review of Systems  ?All other systems reviewed and are negative. ? ? ?Objective:  ? ?Vitals: ?BP (!) 145/90 (BP Location: Right Arm)   Pulse 74   Temp 98 ?F (36.7 ?C) (Oral)   Resp 17   SpO2 98%  ? ?Physical Exam ?Vitals and nursing note reviewed.  ?Constitutional:   ?   General: He is not in acute distress. ?   Appearance: He is well-developed.  ?HENT:  ?   Head: Normocephalic and atraumatic.  ?Eyes:  ?   Conjunctiva/sclera: Conjunctivae normal.  ?Cardiovascular:  ?   Rate and Rhythm: Normal rate and regular rhythm.  ?   Heart sounds: No murmur heard. ?Pulmonary:  ?   Effort: Pulmonary effort is normal. No respiratory distress.  ?   Breath sounds: Normal breath sounds.  ?Abdominal:  ?  Palpations: Abdomen is soft.  ?   Tenderness: There is abdominal tenderness.  ?   Comments: Mild direct tenderness diffuse upper abdomen no rebound tenderness no lower abdominal tenderness and no distention.  ?Musculoskeletal:     ?   General: No swelling.  ?   Cervical back: Neck supple.  ?Skin: ?   General: Skin is warm and dry.  ?   Capillary Refill: Capillary refill takes less than 2 seconds.  ?Neurological:  ?   Mental Status: He is alert.  ?Psychiatric:     ?   Mood and Affect: Mood normal.  ? ? ?No results found for this or any previous visit (from the past 24 hour(s)). ? ?No results found.  ?  ? ?Assessment and Plan :  ? ?1. Nausea and vomiting, unspecified vomiting type   ? ? ?Meds ordered this encounter  ?Medications  ? metoCLOPramide  (REGLAN) 10 MG tablet  ?  Sig: Take 1 tablet (10 mg total) by mouth every 6 (six) hours.  ?  Dispense:  30 tablet  ?  Refill:  0  ? ? ?MDM:  ?Rodney Rose is a 46 y.o. male presenting for ongoing nausea and vomiting symptoms.  Patient has history of bipolar and schizophrenia, he also has chronic back pain and is currently taking Chantix for help with smoking cessation.  It is unclear which may be the cause of his persistent nausea I suggested he follow-up with his primary care that he saw yesterday to review lab with him and discussed this chronic ongoing nausea which may be caused from his multiple medications.  I discussed treatment, follow up and return instructions. Questions were answered. Patient stated understanding of instructions and is stable for discharge. ? ?[name] FNP-C MCN  ?  ?[name], NP ?04/08/22 1927 ? ?

## 2022-04-08 NOTE — ED Triage Notes (Signed)
Pt presents with emesis. Pt states he was previously seen for this. States he is not getting better. States zofran is not helping ?  ?

## 2022-09-01 ENCOUNTER — Other Ambulatory Visit: Payer: Self-pay

## 2022-09-01 ENCOUNTER — Ambulatory Visit (HOSPITAL_COMMUNITY)
Admission: EM | Admit: 2022-09-01 | Discharge: 2022-09-01 | Disposition: A | Discharge status: Home / Self Care | Payer: BLUE CROSS/BLUE SHIELD | Attending: Internal Medicine | Admitting: Internal Medicine

## 2022-09-01 ENCOUNTER — Encounter (HOSPITAL_COMMUNITY): Payer: Self-pay | Admitting: Emergency Medicine

## 2022-09-01 DIAGNOSIS — K047 Periapical abscess without sinus: Secondary | ICD-10-CM

## 2022-09-01 MED ORDER — HYDROCODONE-ACETAMINOPHEN 5-325 MG PO TABS
1.0000 | ORAL_TABLET | Freq: Once | ORAL | Status: AC
  Administered 2022-09-01: 1 via ORAL

## 2022-09-01 MED ORDER — HYDROCODONE-ACETAMINOPHEN 5-325 MG PO TABS
ORAL_TABLET | ORAL | Status: AC
  Filled 2022-09-01: qty 1

## 2022-09-01 MED ORDER — TRAMADOL HCL 50 MG PO TABS: 50.0000 mg | ORAL_TABLET | Freq: Two times a day (BID) | ORAL | 0 refills | Status: DC | PRN

## 2022-09-01 MED ORDER — CHLORHEXIDINE GLUCONATE 0.12 % MT SOLN: 15.0000 mL | Freq: Two times a day (BID) | OROMUCOSAL | 0 refills | Status: DC

## 2022-09-01 MED ORDER — CLINDAMYCIN HCL 300 MG PO CAPS: 300.0000 mg | ORAL_CAPSULE | Freq: Three times a day (TID) | ORAL | 0 refills | Status: AC

## 2022-09-01 NOTE — ED Provider Notes (Signed)
[place]    CSN: [id number] Arrival date & time: 09/01/22  1302      History   Chief Complaint Chief Complaint  Patient presents with   Dental Pain    HPI Rodney Rose is a 46 y.o. male comes to urgent care with dental pain which started 4 days ago.  Patient has dental caries and dental cavities.  Pain is throbbing, severe with no known relieving factors.  It is aggravated by chewing.  No swelling of the jaw or a fever or chills.[name]   HPI  Past Medical History:  Diagnosis Date   Bipolar disorder ([place])    Dr. Heriberto Antigua with [facility]   Chronic back pain    Hypertension 2017   Schizophrenia [facility])    Stroke ([place]) 11/2020    Patient Active Problem List   Diagnosis Date Noted   CKD (chronic kidney disease), stage III with proteinuria ([place]) 12/11/2020   Swelling of left hand 12/11/2020   Type 2 diabetes mellitus with diabetic nephropathy, without long-term current use of insulin (HCC)    Acute on chronic anemia    CVA (cerebral vascular accident) ([place]) 12/02/2020   Essential hypertension, benign 11/02/2018   Tobacco use disorder 11/02/2018   Bipolar disorder ([place]) 11/02/2018    Past Surgical History:  Procedure Laterality Date   BUBBLE STUDY  12/05/2020   Procedure: BUBBLE STUDY;  Surgeon: Josue Hector, MD;  Location: [place];  Service: Cardiovascular;;   NO PAST SURGERIES  10/2018   TEE WITHOUT CARDIOVERSION N/A 12/05/2020   Procedure: TRANSESOPHAGEAL ECHOCARDIOGRAM (TEE);  Surgeon: Josue Hector, MD;  Location: [facility] ENDOSCOPY;  Service: Cardiovascular;  Laterality: N/A;       Home Medications    Prior to Admission medications   Medication Sig Start Date End Date Taking? Authorizing Provider  chlorhexidine (PERIDEX) 0.12 % solution Use as directed 15 mLs in the mouth or throat 2 (two) times daily. 09/01/22  Yes Pantelis Elgersma, Myrene Galas, MD  clindamycin (CLEOCIN) 300 MG capsule Take 1 capsule (300 mg total) by mouth 3 (three) times daily for 7  days. 09/01/22 09/08/22 Yes Lincoln Ginley, Myrene Galas, MD  losartan-hydrochlorothiazide (HYZAAR) 100-25 MG tablet TAKE 1 TABLET BY MOUTH EVERY DAY 01/12/22   Lacinda Axon, MD  traMADol (ULTRAM) 50 MG tablet Take 1 tablet (50 mg total) by mouth every 12 (twelve) hours as needed. 09/01/22  Yes Remmy Riffe, Myrene Galas, MD  amLODipine (NORVASC) 10 MG tablet TAKE 1 TABLET BY MOUTH EVERY DAY 10/01/21   Lacinda Axon, MD  aspirin EC 325 MG tablet Take 1 tablet (325 mg total) by mouth daily. 01/06/22   Frann Rider, NP  atorvastatin (LIPITOR) 80 MG tablet TAKE 1 TABLET BY MOUTH EVERY DAY 11/24/21   Lacinda Axon, MD  baclofen (LIORESAL) 10 MG tablet Take 0.5 tablets (5 mg total) by mouth 3 (three) times daily as needed for muscle spasms. 09/16/21   Frann Rider, NP  carvedilol (COREG) 12.5 MG tablet Take 1 tablet (12.5 mg total) by mouth 2 (two) times daily with a meal. 11/24/21   Amponsah, Charisse March, MD  dapagliflozin propanediol (FARXIGA) 5 MG TABS tablet Take 1 tablet (5 mg total) by mouth daily before breakfast. 12/14/21   Lacinda Axon, MD  haloperidol (HALDOL) 5 MG tablet Take 1 tablet (5 mg total) by mouth at bedtime. 07/18/20 01/06/22  Hampton Abbot, MD  loperamide (IMODIUM) 2 MG capsule Take 1 capsule (2 mg total) by mouth 4 (four) times  daily as needed for diarrhea or loose stools. 04/04/22   Valentino Nose, NP  metFORMIN (GLUCOPHAGE) 1000 MG tablet TAKE 1 TABLET BY MOUTH EVERY DAY WITH BREAKFAST 03/03/22   Steffanie Rainwater, MD  metoCLOPramide (REGLAN) 10 MG tablet Take 1 tablet (10 mg total) by mouth every 6 (six) hours. 04/08/22   [name], NP  ondansetron (ZOFRAN-ODT) 4 MG disintegrating tablet Take 1 tablet (4 mg total) by mouth every 8 (eight) hours as needed for nausea or vomiting. 04/04/22   Valentino Nose, NP  Azilsartan-Chlorthalidone 40-12.5 MG TABS Take 1 tablet by mouth daily. Patient not taking: Reported on 04/09/2019 11/02/18 01/23/20  Tysinger, Kermit Balo, PA-C   glyBURIDE-metformin (GLUCOVANCE) 2.5-500 MG tablet Take 1 tablet by mouth 2 (two) times daily with a meal. 04/11/19 03/29/20  Tysinger, Kermit Balo, PA-C    Family History Family History  Problem Relation Age of Onset   Hypertension Mother    COPD Father    Asthma Sister    Heart disease Neg Hx    Stroke Neg Hx    Diabetes Neg Hx     Social History Social History   Tobacco Use   Smoking status: Every Day    Packs/day: 0.50    Years: 6.00    Total pack years: 3.00    Types: Cigarettes   Smokeless tobacco: Never  Vaping Use   Vaping Use: Never used  Substance Use Topics   Alcohol use: No   Drug use: No     Allergies   Penicillins   Review of Systems Review of Systems As per HPI  Physical Exam Triage Vital Signs ED Triage Vitals  Enc Vitals Group     BP 09/01/22 1458 (!) 154/85     Pulse Rate 09/01/22 1458 81     Resp 09/01/22 1458 20     Temp 09/01/22 1458 98.9 F (37.2 C)     Temp Source 09/01/22 1458 Oral     SpO2 09/01/22 1458 98 %     Weight --      Height --      Head Circumference --      Peak Flow --      Pain Score 09/01/22 1455 8     Pain Loc --      Pain Edu? --      Excl. in GC? --    No data found.  Updated Vital Signs BP (!) 154/85 (BP Location: Left Arm)   Pulse 81   Temp 98.9 F (37.2 C) (Oral)   Resp 20   SpO2 98%   Visual Acuity Right Eye Distance:   Left Eye Distance:   Bilateral Distance:    Right Eye Near:   Left Eye Near:    Bilateral Near:     Physical Exam Vitals and nursing note reviewed.  Constitutional:      Appearance: Normal appearance.  HENT:     Right Ear: Tympanic membrane normal.     Left Ear: Tympanic membrane normal.     Nose: No rhinorrhea.     Mouth/Throat:     Comments: Dental caries of the first right maxillary premolar.  Patient has lost several of his teeth. Cardiovascular:     Rate and Rhythm: Normal rate and regular rhythm.     Pulses: Normal pulses.     Heart sounds: Normal heart sounds.   Pulmonary:     Effort: Pulmonary effort is normal.     Breath sounds: Normal breath  sounds.  Neurological:     Mental Status: He is alert.      UC Treatments / Results  Labs (all labs ordered are listed, but only abnormal results are displayed) Labs Reviewed - No data to display  EKG   Radiology No results found.  Procedures Procedures (including critical care time)  Medications Ordered in UC Medications  HYDROcodone-acetaminophen (NORCO/VICODIN) 5-325 MG per tablet 1 tablet (1 tablet Oral Given 09/01/22 1530)    Initial Impression / Assessment and Plan / UC Course  I have reviewed the triage vital signs and the nursing notes.  Pertinent labs & imaging results that were available during my care of the patient were reviewed by me and considered in my medical decision making (see chart for details).     1.  Dental infection: Chlorhexidine mouth rinse as needed Clindamycin 300 mg 3 times daily for 7 days Tramadol 50 mg twice daily as needed for pain Hydrocodone-acetaminophen x1 dose given in the urgent care Information on low-cost dental resources given to patient Return precautions given. Final Clinical Impressions(s) / UC Diagnoses   Final diagnoses:  Dental infection     Discharge Instructions      Please take medications as prescribed Chlorhexidine mouth rinse Please follow-up with a dentist for further evaluation and management Return to urgent care if you have any other concerns.    ED Prescriptions     Medication Sig Dispense Auth. Provider   clindamycin (CLEOCIN) 300 MG capsule Take 1 capsule (300 mg total) by mouth 3 (three) times daily for 7 days. 21 capsule Ahmere Hemenway, Britta Mccreedy, MD   chlorhexidine (PERIDEX) 0.12 % solution Use as directed 15 mLs in the mouth or throat 2 (two) times daily. 120 mL Azir Muzyka, Britta Mccreedy, MD   traMADol (ULTRAM) 50 MG tablet Take 1 tablet (50 mg total) by mouth every 12 (twelve) hours as needed. 6 tablet Prather Failla, Britta Mccreedy,  MD      I have reviewed the PDMP during this encounter.   Merrilee Jansky, MD 09/01/22 [phone]

## 2022-09-01 NOTE — Discharge Instructions (Addendum)
Please take medications as prescribed Chlorhexidine mouth rinse Please follow-up with a dentist for further evaluation and management Return to urgent care if you have any other concerns.

## 2022-09-01 NOTE — ED Triage Notes (Signed)
Tooth pain for 4 days.  Pain is top right tooth.  Patient does not have a dentist

## 2022-09-06 ENCOUNTER — Encounter (HOSPITAL_COMMUNITY): Payer: Self-pay | Admitting: Emergency Medicine

## 2022-09-06 ENCOUNTER — Ambulatory Visit (HOSPITAL_COMMUNITY)
Admission: EM | Admit: 2022-09-06 | Discharge: 2022-09-06 | Disposition: A | Discharge status: Home / Self Care | Payer: BLUE CROSS/BLUE SHIELD | Attending: Internal Medicine | Admitting: Internal Medicine

## 2022-09-06 DIAGNOSIS — J069 Acute upper respiratory infection, unspecified: Secondary | ICD-10-CM | POA: Diagnosis not present

## 2022-09-06 MED ORDER — BENZONATATE 100 MG PO CAPS
100.0000 mg | ORAL_CAPSULE | Freq: Three times a day (TID) | ORAL | 0 refills | Status: DC | PRN
Start: 2022-09-06 — End: 2024-07-01

## 2022-09-06 NOTE — ED Triage Notes (Signed)
Pt c/o cough that is productive and congestion for couple days. Took Nyquil but didn't help.

## 2022-09-06 NOTE — Discharge Instructions (Addendum)
Increase oral fluid intake Take medications as prescribed No indication for COVID testing since your family members tested negative for COVID If you have any worsening symptoms please return to urgent care to be reevaluated.

## 2022-09-06 NOTE — ED Provider Notes (Signed)
[place]    CSN: [id number] Arrival date & time: 09/06/22  1124      History   Chief Complaint Chief Complaint  Patient presents with   Cough   Nasal Congestion    HPI Rodney Rose is a 46 y.o. male comes to the urgent care with 2-day history of cough productive of clear sputum and nasal congestion.  Symptoms started insidiously and has been persistent.  No nausea or vomiting.  No diarrhea.  Patient's sister has similar symptoms when has tested negative for COVID-19 virus.  Patient is fully vaccinated against COVID-19 virus.  No dizziness, near syncope or syncopal episodes. HPI  Past Medical History:  Diagnosis Date   Bipolar disorder ([place])    Dr. Heriberto Antigua with [facility]   Chronic back pain    Hypertension 2017   Schizophrenia [facility])    Stroke ([place]) 11/2020    Patient Active Problem List   Diagnosis Date Noted   CKD (chronic kidney disease), stage III with proteinuria ([place]) 12/11/2020   Swelling of left hand 12/11/2020   Type 2 diabetes mellitus with diabetic nephropathy, without long-term current use of insulin (HCC)    Acute on chronic anemia    CVA (cerebral vascular accident) ([place]) 12/02/2020   Essential hypertension, benign 11/02/2018   Tobacco use disorder 11/02/2018   Bipolar disorder ([place]) 11/02/2018    Past Surgical History:  Procedure Laterality Date   BUBBLE STUDY  12/05/2020   Procedure: BUBBLE STUDY;  Surgeon: Josue Hector, MD;  Location: [place];  Service: Cardiovascular;;   NO PAST SURGERIES  10/2018   TEE WITHOUT CARDIOVERSION N/A 12/05/2020   Procedure: TRANSESOPHAGEAL ECHOCARDIOGRAM (TEE);  Surgeon: Josue Hector, MD;  Location: [facility] ENDOSCOPY;  Service: Cardiovascular;  Laterality: N/A;       Home Medications    Prior to Admission medications   Medication Sig Start Date End Date Taking? Authorizing Provider  benzonatate (TESSALON) 100 MG capsule Take 1 capsule (100 mg total) by mouth 3 (three) times daily as  needed for cough. 09/06/22  Yes Sharlyne Koeneman, Myrene Galas, MD  losartan-hydrochlorothiazide (HYZAAR) 100-25 MG tablet TAKE 1 TABLET BY MOUTH EVERY DAY 01/12/22   Lacinda Axon, MD  amLODipine (NORVASC) 10 MG tablet TAKE 1 TABLET BY MOUTH EVERY DAY 10/01/21   Lacinda Axon, MD  aspirin EC 325 MG tablet Take 1 tablet (325 mg total) by mouth daily. 01/06/22   Frann Rider, NP  atorvastatin (LIPITOR) 80 MG tablet TAKE 1 TABLET BY MOUTH EVERY DAY 11/24/21   Lacinda Axon, MD  baclofen (LIORESAL) 10 MG tablet Take 0.5 tablets (5 mg total) by mouth 3 (three) times daily as needed for muscle spasms. 09/16/21   Frann Rider, NP  carvedilol (COREG) 12.5 MG tablet Take 1 tablet (12.5 mg total) by mouth 2 (two) times daily with a meal. 11/24/21   Amponsah, Charisse March, MD  chlorhexidine (PERIDEX) 0.12 % solution Use as directed 15 mLs in the mouth or throat 2 (two) times daily. 09/01/22   Yadhira Mckneely, Myrene Galas, MD  clindamycin (CLEOCIN) 300 MG capsule Take 1 capsule (300 mg total) by mouth 3 (three) times daily for 7 days. 09/01/22 09/08/22  Chase Picket, MD  dapagliflozin propanediol (FARXIGA) 5 MG TABS tablet Take 1 tablet (5 mg total) by mouth daily before breakfast. 12/14/21   Lacinda Axon, MD  haloperidol (HALDOL) 5 MG tablet Take 1 tablet (5 mg total) by mouth at bedtime. 07/18/20 01/06/22  Hampton Abbot,  MD  loperamide (IMODIUM) 2 MG capsule Take 1 capsule (2 mg total) by mouth 4 (four) times daily as needed for diarrhea or loose stools. 04/04/22   Valentino Nose, NP  metFORMIN (GLUCOPHAGE) 1000 MG tablet TAKE 1 TABLET BY MOUTH EVERY DAY WITH BREAKFAST 03/03/22   Steffanie Rainwater, MD  metoCLOPramide (REGLAN) 10 MG tablet Take 1 tablet (10 mg total) by mouth every 6 (six) hours. 04/08/22   [name], NP  ondansetron (ZOFRAN-ODT) 4 MG disintegrating tablet Take 1 tablet (4 mg total) by mouth every 8 (eight) hours as needed for nausea or vomiting. 04/04/22   Valentino Nose, NP  traMADol  (ULTRAM) 50 MG tablet Take 1 tablet (50 mg total) by mouth every 12 (twelve) hours as needed. 09/01/22   LampteyBritta Mccreedy, MD  Azilsartan-Chlorthalidone 40-12.5 MG TABS Take 1 tablet by mouth daily. Patient not taking: Reported on 04/09/2019 11/02/18 01/23/20  Tysinger, Kermit Balo, PA-C  glyBURIDE-metformin (GLUCOVANCE) 2.5-500 MG tablet Take 1 tablet by mouth 2 (two) times daily with a meal. 04/11/19 03/29/20  Tysinger, Kermit Balo, PA-C    Family History Family History  Problem Relation Age of Onset   Hypertension Mother    COPD Father    Asthma Sister    Heart disease Neg Hx    Stroke Neg Hx    Diabetes Neg Hx     Social History Social History   Tobacco Use   Smoking status: Every Day    Packs/day: 0.50    Years: 6.00    Total pack years: 3.00    Types: Cigarettes   Smokeless tobacco: Never  Vaping Use   Vaping Use: Never used  Substance Use Topics   Alcohol use: No   Drug use: No     Allergies   Penicillins   Review of Systems Review of Systems  Constitutional: Negative.   HENT:  Positive for congestion and rhinorrhea. Negative for sore throat.   Respiratory:  Positive for cough. Negative for chest tightness, shortness of breath and wheezing.   Cardiovascular: Negative.   Gastrointestinal: Negative.      Physical Exam Triage Vital Signs ED Triage Vitals [09/06/22 1316]  Enc Vitals Group     BP 138/83     Pulse Rate 84     Resp 18     Temp 99 F (37.2 C)     Temp Source Oral     SpO2 97 %     Weight      Height      Head Circumference      Peak Flow      Pain Score 0     Pain Loc      Pain Edu?      Excl. in GC?    No data found.  Updated Vital Signs BP 138/83 (BP Location: Left Arm)   Pulse 84   Temp 99 F (37.2 C) (Oral)   Resp 18   SpO2 97%   Visual Acuity Right Eye Distance:   Left Eye Distance:   Bilateral Distance:    Right Eye Near:   Left Eye Near:    Bilateral Near:     Physical Exam Vitals and nursing note reviewed.   Constitutional:      General: He is not in acute distress.    Appearance: He is not ill-appearing.  HENT:     Right Ear: Tympanic membrane normal.     Left Ear: Tympanic membrane normal.  Mouth/Throat:     Mouth: Mucous membranes are moist.     Pharynx: No oropharyngeal exudate or posterior oropharyngeal erythema.  Cardiovascular:     Rate and Rhythm: Normal rate and regular rhythm.     Pulses: Normal pulses.     Heart sounds: Normal heart sounds.  Pulmonary:     Effort: Pulmonary effort is normal.     Breath sounds: Normal breath sounds.  Abdominal:     General: Bowel sounds are normal.     Palpations: Abdomen is soft.  Neurological:     Mental Status: He is alert.      UC Treatments / Results  Labs (all labs ordered are listed, but only abnormal results are displayed) Labs Reviewed - No data to display  EKG   Radiology No results found.  Procedures Procedures (including critical care time)  Medications Ordered in UC Medications - No data to display  Initial Impression / Assessment and Plan / UC Course  I have reviewed the triage vital signs and the nursing notes.  Pertinent labs & imaging results that were available during my care of the patient were reviewed by me and considered in my medical decision making (see chart for details).     1.  Viral URI with cough: Maintain adequate hydration Tessalon Perles as needed for cough If COVID-19 is positive patient will benefit from antiviral therapy.  GFR is 48.  Patient will benefit from Paxlovid. Return precautions given. Final Clinical Impressions(s) / UC Diagnoses   Final diagnoses:  Viral URI with cough     Discharge Instructions      Increase oral fluid intake Take medications as prescribed No indication for COVID testing since your family members tested negative for COVID If you have any worsening symptoms please return to urgent care to be reevaluated.   ED Prescriptions     Medication  Sig Dispense Auth. Provider   benzonatate (TESSALON) 100 MG capsule Take 1 capsule (100 mg total) by mouth 3 (three) times daily as needed for cough. 30 capsule Reniyah Gootee, Britta Mccreedy, MD      PDMP not reviewed this encounter.   Merrilee Jansky, MD 09/06/22 1610

## 2022-09-10 ENCOUNTER — Other Ambulatory Visit: Payer: Self-pay

## 2022-09-10 ENCOUNTER — Encounter (HOSPITAL_COMMUNITY): Payer: Self-pay

## 2022-09-10 ENCOUNTER — Emergency Department (HOSPITAL_COMMUNITY)
Admission: EM | Admit: 2022-09-10 | Discharge: 2022-09-10 | Disposition: A | Discharge status: Home Health | Payer: BLUE CROSS/BLUE SHIELD | Attending: Emergency Medicine | Admitting: Emergency Medicine

## 2022-09-10 DIAGNOSIS — Z7982 Long term (current) use of aspirin: Secondary | ICD-10-CM | POA: Diagnosis not present

## 2022-09-10 DIAGNOSIS — J069 Acute upper respiratory infection, unspecified: Secondary | ICD-10-CM | POA: Insufficient documentation

## 2022-09-10 DIAGNOSIS — Z79899 Other long term (current) drug therapy: Secondary | ICD-10-CM | POA: Insufficient documentation

## 2022-09-10 DIAGNOSIS — R059 Cough, unspecified: Secondary | ICD-10-CM | POA: Diagnosis present

## 2022-09-10 MED ORDER — FLUTICASONE PROPIONATE 50 MCG/ACT NA SUSP: 2.0000 | Freq: Every day | NASAL | 0 refills | Status: DC

## 2022-09-10 MED ORDER — LORATADINE 10 MG PO TABS: 10.0000 mg | ORAL_TABLET | Freq: Every day | ORAL | 0 refills | Status: DC

## 2022-09-10 NOTE — Discharge Instructions (Signed)
You have been seen today for your complaint of nasal congestion, runny nose. Your discharge medications include Claritin.  This is an antihistamine.  You may take it once in the morning and once in the evening until symptoms resolve.  I am also sending Flonase.  This is a nasal steroid.  You should spray 2 sprays in each nostril for the first 2 days, and then 1 spray in each nostril per day until symptoms resolve.. Home care instructions are as follows:  You should continue to eat and drink as you normally would.  It may be beneficial to drink more fluids than he normally would. Follow up with: Your primary care provider in the next week Please seek immediate medical care if you develop any of the following symptoms: You have shortness of breath that gets worse. You have very bad or constant: Headache. Ear pain. Pain in your forehead, behind your eyes, and over your cheekbones (sinus pain). Chest pain. You have long-lasting (chronic) lung disease along with any of these: Making high-pitched whistling sounds when you breathe, most often when you breathe out (wheezing). Long-lasting cough (more than 14 days). Coughing up blood. A change in your usual mucus. You have a stiff neck. You have changes in your: Vision. Hearing. Thinking. Mood. At this time there does not appear to be the presence of an emergent medical condition, however there is always the potential for conditions to change. Please read and follow the below instructions.  Do not take your medicine if  develop an itchy rash, swelling in your mouth or lips, or difficulty breathing; call 911 and seek immediate emergency medical attention if this occurs.  You may review your lab tests and imaging results in their entirety on your MyChart account.  Please discuss all results of fully with your primary care provider and other specialist at your follow-up visit.  Note: Portions of this text may have been transcribed using voice  recognition software. Every effort was made to ensure accuracy; however, inadvertent computerized transcription errors may still be present.

## 2022-09-10 NOTE — ED Triage Notes (Signed)
Patient reports that he has had nasal and chest congestion x 7 days. Patient reports that he had a fever the first day, but none afterwards.  Patient was also seen on 09/06/22 for the same.

## 2022-09-10 NOTE — ED Provider Notes (Signed)
[facility]-EMERGENCY DEPT Provider Note   CSN: [id number] Arrival date & time: 09/10/22  1246     History  Chief Complaint  Patient presents with   Nasal Congestion    Rodney Rose is a 45 y.o. male.  Who presents to the ED for evaluation of nasal congestion, rhinorrhea, cough.  Reports symptoms started approximately 7 days ago.  He was seen in urgent care 4 days ago and was given [employer].  He states these have provided moderate relief with his cough.  He is able to sleep through the night denies chest pain or shortness of breath.  Also denies fevers, nausea, vomiting, abdominal pain, sinus pain, ear pain, throat pain, neck pain.  Patient has not tried any other home remedies.  HPI     Home Medications Prior to Admission medications   Medication Sig Start Date End Date Taking? Authorizing Provider  losartan-hydrochlorothiazide (HYZAAR) 100-25 MG tablet TAKE 1 TABLET BY MOUTH EVERY DAY 01/12/22   Steffanie Rainwater, MD  amLODipine (NORVASC) 10 MG tablet TAKE 1 TABLET BY MOUTH EVERY DAY 10/01/21   Steffanie Rainwater, MD  aspirin EC 325 MG tablet Take 1 tablet (325 mg total) by mouth daily. 01/06/22   Ihor Austin, NP  atorvastatin (LIPITOR) 80 MG tablet TAKE 1 TABLET BY MOUTH EVERY DAY 11/24/21   Steffanie Rainwater, MD  baclofen (LIORESAL) 10 MG tablet Take 0.5 tablets (5 mg total) by mouth 3 (three) times daily as needed for muscle spasms. 09/16/21   Ihor Austin, NP  benzonatate (TESSALON) 100 MG capsule Take 1 capsule (100 mg total) by mouth 3 (three) times daily as needed for cough. 09/06/22   Lamptey, Britta Mccreedy, MD  carvedilol (COREG) 12.5 MG tablet Take 1 tablet (12.5 mg total) by mouth 2 (two) times daily with a meal. 11/24/21   Amponsah, Flossie Buffy, MD  chlorhexidine (PERIDEX) 0.12 % solution Use as directed 15 mLs in the mouth or throat 2 (two) times daily. 09/01/22   Lamptey, Britta Mccreedy, MD  dapagliflozin propanediol (FARXIGA) 5 MG TABS tablet Take  1 tablet (5 mg total) by mouth daily before breakfast. 12/14/21   Steffanie Rainwater, MD  fluticasone (FLONASE) 50 MCG/ACT nasal spray Place 2 sprays into both nostrils daily. 09/10/22  Yes Ritamarie Arkin, Edsel Petrin, PA-C  haloperidol (HALDOL) 5 MG tablet Take 1 tablet (5 mg total) by mouth at bedtime. 07/18/20 01/06/22  Nelly Rout, MD  loperamide (IMODIUM) 2 MG capsule Take 1 capsule (2 mg total) by mouth 4 (four) times daily as needed for diarrhea or loose stools. 04/04/22   Valentino Nose, NP  loratadine (CLARITIN) 10 MG tablet Take 1 tablet (10 mg total) by mouth daily. 09/10/22 10/10/22 Yes Elvin Banker, Edsel Petrin, PA-C  metFORMIN (GLUCOPHAGE) 1000 MG tablet TAKE 1 TABLET BY MOUTH EVERY DAY WITH BREAKFAST 03/03/22   Steffanie Rainwater, MD  metoCLOPramide (REGLAN) 10 MG tablet Take 1 tablet (10 mg total) by mouth every 6 (six) hours. 04/08/22   [name], NP  ondansetron (ZOFRAN-ODT) 4 MG disintegrating tablet Take 1 tablet (4 mg total) by mouth every 8 (eight) hours as needed for nausea or vomiting. 04/04/22   Valentino Nose, NP  traMADol (ULTRAM) 50 MG tablet Take 1 tablet (50 mg total) by mouth every 12 (twelve) hours as needed. 09/01/22   LampteyBritta Mccreedy, MD  Azilsartan-Chlorthalidone 40-12.5 MG TABS Take 1 tablet by mouth daily. Patient not taking: Reported on 04/09/2019 11/02/18 01/23/20  Tysinger, Camelia Eng, PA-C  glyBURIDE-metformin (GLUCOVANCE) 2.5-500 MG tablet Take 1 tablet by mouth 2 (two) times daily with a meal. 04/11/19 03/29/20  Tysinger, Camelia Eng, PA-C      Allergies    Penicillins    Review of Systems   Review of Systems  HENT:  Positive for congestion and rhinorrhea.   Respiratory:  Positive for cough.     Physical Exam Updated Vital Signs BP 134/82 (BP Location: Left Arm)   Pulse 85   Temp 98.8 F (37.1 C) (Oral)   Resp 17   Ht 6' (1.829 m)   Wt 81.2 kg   SpO2 100%   BMI 24.28 kg/m  Physical Exam Vitals and nursing note reviewed.  Constitutional:      General: He  is not in acute distress.    Appearance: Normal appearance. He is normal weight. He is not ill-appearing.  HENT:     Head: Normocephalic and atraumatic.     Right Ear: Tympanic membrane normal.     Left Ear: Tympanic membrane normal.     Nose: Congestion and rhinorrhea present.     Mouth/Throat:     Mouth: Mucous membranes are moist.     Pharynx: Oropharynx is clear. Uvula midline. No pharyngeal swelling, oropharyngeal exudate or posterior oropharyngeal erythema.     Tonsils: No tonsillar exudate or tonsillar abscesses.     Comments: There is moderate postnasal drip present Cardiovascular:     Rate and Rhythm: Normal rate and regular rhythm.     Heart sounds: Normal heart sounds.  Pulmonary:     Effort: Pulmonary effort is normal. No respiratory distress.     Breath sounds: No wheezing, rhonchi or rales.  Abdominal:     General: Abdomen is flat.  Musculoskeletal:        General: Normal range of motion.     Cervical back: Neck supple.     Right lower leg: No edema.     Left lower leg: No edema.  Skin:    General: Skin is warm and dry.  Neurological:     Mental Status: He is alert and oriented to person, place, and time.  Psychiatric:        Mood and Affect: Mood normal.        Behavior: Behavior normal.     ED Results / Procedures / Treatments   Labs (all labs ordered are listed, but only abnormal results are displayed) Labs Reviewed  RESP PANEL BY RT-PCR (FLU A&B, COVID) ARPGX2    EKG None  Radiology No results found.  Procedures Procedures    Medications Ordered in ED Medications - No data to display  ED Course/ Medical Decision Making/ A&P                           Medical Decision Making Risk OTC drugs.  This patient presents to the ED for concern of nasal congestion, cough, rhinorrhea.  The differential diagnosis includes viral URI, COVID, flu  Additional history obtained from: Nursing notes from this visit. Previous records within EMR system  urgent care visit for same complaints on 09/06/2022  Afebrile, hemodynamically stable.  Patient complaining of nasal congestion, rhinorrhea, cough.  Denies fevers, chills, body aches, nausea, vomiting, abdominal pain, chest pain, shortness of breath.  Patient was seen on 09/06/2022 for same complaint.  Was prescribed Tessalon Perles.  Reports this has provided moderate relief in his cough.  Has tried no other home remedies.  Reports having similar symptoms approximately 1 year ago, stated it lasted between 1 and 2 weeks.  Physical exam revealed moderate amount of postnasal drip, as well as unremarkable.  Specifically no adventitious breath sounds, oropharyngeal erythema or exudates or swelling.  Patient is afebrile here.  Patient will be given a prescription for Claritin and Flonase and instructed to follow-up with his primary care provider in the next week if symptoms do not improve.  Stable at discharge.  At this time there does not appear to be any evidence of an acute emergency medical condition and the patient appears stable for discharge with appropriate outpatient follow up. Diagnosis was discussed with patient who verbalizes understanding of care plan and is agreeable to discharge. I have discussed return precautions with patient who verbalizes understanding. Patient encouraged to follow-up with their PCP within 1 week. All questions answered.   Note: Portions of this report may have been transcribed using voice recognition software. Every effort was made to ensure accuracy; however, inadvertent computerized transcription errors may still be present.          Final Clinical Impression(s) / ED Diagnoses Final diagnoses:  Viral URI with cough    Rx / DC Orders ED Discharge Orders          Ordered    loratadine (CLARITIN) 10 MG tablet  Daily        09/10/22 1306    fluticasone (FLONASE) 50 MCG/ACT nasal spray  Daily        09/10/22 1306              Michelle Piper,  PA-C 09/10/22 1316    Jacalyn Lefevre, MD 09/10/22 [phone]

## 2023-01-08 ENCOUNTER — Encounter (HOSPITAL_COMMUNITY): Payer: Self-pay

## 2023-01-08 ENCOUNTER — Ambulatory Visit (HOSPITAL_COMMUNITY)
Admission: EM | Admit: 2023-01-08 | Discharge: 2023-01-08 | Disposition: A | Discharge status: Home / Self Care | Payer: BLUE CROSS/BLUE SHIELD | Attending: Family Medicine | Admitting: Family Medicine

## 2023-01-08 DIAGNOSIS — J069 Acute upper respiratory infection, unspecified: Secondary | ICD-10-CM | POA: Diagnosis not present

## 2023-01-08 MED ORDER — PROMETHAZINE-DM 6.25-15 MG/5ML PO SYRP
5.0000 mL | ORAL_SOLUTION | Freq: Four times a day (QID) | ORAL | 0 refills | Status: DC | PRN
Start: 2023-01-08 — End: 2024-07-01

## 2023-01-08 NOTE — Discharge Instructions (Signed)
Caring for yourself: Get plenty of rest. Drink plenty of fluids, enough so that your urine is light yellow or clear like water. If you have kidney, heart, or liver disease and have to limit fluids, talk with your doctor before you increase the amount of fluids you drink. Take an over-the-counter pain medicine if needed, such as acetaminophen (Tylenol), ibuprofen (Advil, Motrin), or naproxen (Aleve), to relieve fever, headache, and muscle aches. Read and follow all instructions on the label. No one younger than 20 should take aspirin. It has been linked to Reye syndrome, a serious illness. Before you use over the counter cough and cold medicines, check the label. These medicines may not be safe for children younger than age 6 or for people with certain health problems. If the skin around your nose and lips becomes sore, put some petroleum jelly on the area.  Avoid spreading a respiratory virus: Wash your hands regularly, and keep your hands away from your face.  Stay home from school, work, and other public places until you are feeling better and your fever has been gone for at least 24 hours. The fever needs to have gone away on its own without the help of medicine.  

## 2023-01-08 NOTE — ED Triage Notes (Signed)
Patient report cold with cough and runny nose for 2 days. Using cough drops

## 2023-01-10 NOTE — ED Provider Notes (Signed)
[place]   [id number] 01/08/23 Arrival Time: 1500  ASSESSMENT & PLAN:  1. Viral URI with cough    Discussed typical duration of likely viral illness. OTC symptom care as needed.  Discharge Medication List as of 01/08/2023  4:31 PM     START taking these medications   Details  promethazine-dextromethorphan (PROMETHAZINE-DM) 6.25-15 MG/5ML syrup Take 5 mLs by mouth 4 (four) times daily as needed for cough., Starting Sat 01/08/2023, Normal         Follow-up Information     Amponsah, Charisse March, MD.   Specialty: Internal Medicine Why: As needed. Contact information: [place] [id number] [facility].   Specialty: Urgent Care Why: If worsening or failing to improve as anticipated. Contact information: [place] [address] [phone]                Reviewed expectations re: course of current medical issues. Questions answered. Outlined signs and symptoms indicating need for more acute intervention. Understanding verbalized. After Visit Summary given.   SUBJECTIVE: History from: Patient. Rodney Rose is a 47 y.o. male. Reports: cough and runny nose; abrupt onset; x 2 days. Denies: fever and difficulty breathing. Normal PO intake without n/v/d.  OBJECTIVE:  Vitals:   01/08/23 1618  BP: (!) 176/89  Pulse: 86  Resp: 18  Temp: 98.6 F (37 C)  TempSrc: Oral  SpO2: 97%    General appearance: alert; no distress Eyes: PERRLA; EOMI; conjunctiva normal HENT: [place]; AT; with nasal congestion Neck: supple  Lungs: speaks full sentences without difficulty; unlabored; ctab; dry cough Extremities: no edema Skin: warm and dry Neurologic: normal gait Psychological: alert and cooperative; normal mood and affect   Allergies  Allergen Reactions   Penicillins Swelling    Past Medical History:  Diagnosis Date   Bipolar disorder ([place])    Dr. Heriberto Antigua  with [facility]   Chronic back pain    Hypertension 2017   Schizophrenia ([place])    Stroke ([place]) 11/2020   Social History   Socioeconomic History   Marital status: Single    Spouse name: Not on file   Number of children: Not on file   Years of education: Not on file   Highest education level: Not on file  Occupational History   Not on file  Tobacco Use   Smoking status: Every Day    Packs/day: 0.50    Years: 6.00    Total pack years: 3.00    Types: Cigarettes   Smokeless tobacco: Never  Vaping Use   Vaping Use: Never used  Substance and Sexual Activity   Alcohol use: No   Drug use: No   Sexual activity: Yes    Birth control/protection: Condom  Other Topics Concern   Not on file  Social History Narrative   Lives with mother and sister.     Right Handed   Drinks 2-4 cups caffeine daily   Social Determinants of Health   Financial Resource Strain: Not on file  Food Insecurity: Not on file  Transportation Needs: Not on file  Physical Activity: Not on file  Stress: Not on file  Social Connections: Not on file  Intimate Partner Violence: Not on file   Family History  Problem Relation Age of Onset   Hypertension Mother    COPD Father    Asthma Sister    Heart disease Neg Hx  Stroke Neg Hx    Diabetes Neg Hx    Past Surgical History:  Procedure Laterality Date   BUBBLE STUDY  12/05/2020   Procedure: BUBBLE STUDY;  Surgeon: Josue Hector, MD;  Location: [place];  Service: Cardiovascular;;   NO PAST SURGERIES  10/2018   TEE WITHOUT CARDIOVERSION N/A 12/05/2020   Procedure: TRANSESOPHAGEAL ECHOCARDIOGRAM (TEE);  Surgeon: Josue Hector, MD;  Location: [facility] ENDOSCOPY;  Service: Cardiovascular;  Laterality: N/A;     Vanessa Kick, MD 01/10/23 [phone]

## 2023-01-17 ENCOUNTER — Emergency Department (HOSPITAL_COMMUNITY)
Admission: EM | Admit: 2023-01-17 | Discharge: 2023-01-17 | Disposition: A | Discharge status: Home / Self Care | Payer: BLUE CROSS/BLUE SHIELD | Attending: Student | Admitting: Student

## 2023-01-17 ENCOUNTER — Other Ambulatory Visit: Payer: Self-pay

## 2023-01-17 DIAGNOSIS — J3489 Other specified disorders of nose and nasal sinuses: Secondary | ICD-10-CM | POA: Diagnosis not present

## 2023-01-17 DIAGNOSIS — R059 Cough, unspecified: Secondary | ICD-10-CM | POA: Insufficient documentation

## 2023-01-17 DIAGNOSIS — Z5321 Procedure and treatment not carried out due to patient leaving prior to being seen by health care provider: Secondary | ICD-10-CM | POA: Diagnosis not present

## 2023-01-17 NOTE — ED Triage Notes (Signed)
Runny nose and cough x4 days  Denies fever, body aches, chills, headache.

## 2023-01-18 ENCOUNTER — Emergency Department (HOSPITAL_COMMUNITY)
Admission: EM | Admit: 2023-01-18 | Discharge: 2023-01-18 | Disposition: A | Discharge status: Home / Self Care | Payer: BLUE CROSS/BLUE SHIELD | Attending: Emergency Medicine | Admitting: Emergency Medicine

## 2023-01-18 DIAGNOSIS — J069 Acute upper respiratory infection, unspecified: Secondary | ICD-10-CM

## 2023-01-18 DIAGNOSIS — Z7984 Long term (current) use of oral hypoglycemic drugs: Secondary | ICD-10-CM | POA: Insufficient documentation

## 2023-01-18 DIAGNOSIS — Z20822 Contact with and (suspected) exposure to covid-19: Secondary | ICD-10-CM | POA: Insufficient documentation

## 2023-01-18 DIAGNOSIS — I10 Essential (primary) hypertension: Secondary | ICD-10-CM | POA: Diagnosis not present

## 2023-01-18 DIAGNOSIS — R059 Cough, unspecified: Secondary | ICD-10-CM | POA: Diagnosis present

## 2023-01-18 DIAGNOSIS — E119 Type 2 diabetes mellitus without complications: Secondary | ICD-10-CM | POA: Diagnosis not present

## 2023-01-18 DIAGNOSIS — Z79899 Other long term (current) drug therapy: Secondary | ICD-10-CM | POA: Insufficient documentation

## 2023-01-18 DIAGNOSIS — Z7982 Long term (current) use of aspirin: Secondary | ICD-10-CM | POA: Insufficient documentation

## 2023-01-18 DIAGNOSIS — J101 Influenza due to other identified influenza virus with other respiratory manifestations: Secondary | ICD-10-CM | POA: Insufficient documentation

## 2023-01-18 LAB — RESP PANEL BY RT-PCR (RSV, FLU A&B, COVID)  RVPGX2
Influenza A by PCR: NEGATIVE
Influenza B by PCR: NEGATIVE
Resp Syncytial Virus by PCR: NEGATIVE
SARS Coronavirus 2 by RT PCR: NEGATIVE

## 2023-01-18 MED ORDER — BENZONATATE 100 MG PO CAPS: 100.0000 mg | ORAL_CAPSULE | Freq: Three times a day (TID) | ORAL | 0 refills | Status: DC

## 2023-01-18 NOTE — Discharge Instructions (Signed)
It was a pleasure taking care of you today. As discussed, I suspect you have a viral infection. A cough after a viral infection can last a few weeks. I am sending you home with cough medication. Take as needed. Follow-up with PCP if symptoms do not improve over the next few days. Return to the ER for new or worsening symptoms.

## 2023-01-18 NOTE — ED Triage Notes (Signed)
Runny nose and cough x4 days  Denies fever, body aches, chills,and headache

## 2023-01-18 NOTE — ED Provider Notes (Signed)
[place] EMERGENCY DEPARTMENT AT [facility] Provider Note   CSN: [id number] Arrival date & time: 01/18/23  6237     History  Chief Complaint  Patient presents with   Cough    Rodney Rose is a 47 y.o. male with a past medical history significant for hypertension, bipolar disorder, history of CVA, and diabetes who presents to the ED due to rhinorrhea and cough x 4 days.  Denies fever, shortness of breath, and chest pain.  Patient seen at urgent care for similar symptoms on 1/13 and discharged with promethazine-DM. Rodney Rose notes persistent cough.  Denies abdominal pain, nausea, vomiting, diarrhea.  No other complaints.  History obtained from patient and past medical records. No interpreter used during encounter.       Home Medications Prior to Admission medications   Medication Sig Start Date End Date Taking? Authorizing Provider  benzonatate (TESSALON) 100 MG capsule Take 1 capsule (100 mg total) by mouth every 8 (eight) hours. 01/18/23  Yes Mashal Slavick, Druscilla Brownie, PA-C  losartan-hydrochlorothiazide (HYZAAR) 100-25 MG tablet TAKE 1 TABLET BY MOUTH EVERY DAY 01/12/22   Lacinda Axon, MD  amLODipine (NORVASC) 10 MG tablet TAKE 1 TABLET BY MOUTH EVERY DAY 10/01/21   Lacinda Axon, MD  aspirin EC 325 MG tablet Take 1 tablet (325 mg total) by mouth daily. 01/06/22   Frann Rider, NP  atorvastatin (LIPITOR) 80 MG tablet TAKE 1 TABLET BY MOUTH EVERY DAY 11/24/21   Lacinda Axon, MD  baclofen (LIORESAL) 10 MG tablet Take 0.5 tablets (5 mg total) by mouth 3 (three) times daily as needed for muscle spasms. 09/16/21   Frann Rider, NP  benzonatate (TESSALON) 100 MG capsule Take 1 capsule (100 mg total) by mouth 3 (three) times daily as needed for cough. 09/06/22   Lamptey, Myrene Galas, MD  carvedilol (COREG) 12.5 MG tablet Take 1 tablet (12.5 mg total) by mouth 2 (two) times daily with a meal. 11/24/21   Amponsah, Charisse March, MD  chlorhexidine (PERIDEX) 0.12 % solution Use  as directed 15 mLs in the mouth or throat 2 (two) times daily. 09/01/22   Lamptey, Myrene Galas, MD  dapagliflozin propanediol (FARXIGA) 5 MG TABS tablet Take 1 tablet (5 mg total) by mouth daily before breakfast. 12/14/21   Lacinda Axon, MD  fluticasone (FLONASE) 50 MCG/ACT nasal spray Place 2 sprays into both nostrils daily. 09/10/22   Schutt, Grafton Folk, PA-C  haloperidol (HALDOL) 5 MG tablet Take 1 tablet (5 mg total) by mouth at bedtime. 07/18/20 01/06/22  Hampton Abbot, MD  loperamide (IMODIUM) 2 MG capsule Take 1 capsule (2 mg total) by mouth 4 (four) times daily as needed for diarrhea or loose stools. 04/04/22   Eulogio Bear, NP  loratadine (CLARITIN) 10 MG tablet Take 1 tablet (10 mg total) by mouth daily. 09/10/22 10/10/22  Schutt, Grafton Folk, PA-C  metFORMIN (GLUCOPHAGE) 1000 MG tablet TAKE 1 TABLET BY MOUTH EVERY DAY WITH BREAKFAST 03/03/22   Lacinda Axon, MD  metoCLOPramide (REGLAN) 10 MG tablet Take 1 tablet (10 mg total) by mouth every 6 (six) hours. 04/08/22   Hezzie Bump, NP  ondansetron (ZOFRAN-ODT) 4 MG disintegrating tablet Take 1 tablet (4 mg total) by mouth every 8 (eight) hours as needed for nausea or vomiting. 04/04/22   Eulogio Bear, NP  promethazine-dextromethorphan (PROMETHAZINE-DM) 6.25-15 MG/5ML syrup Take 5 mLs by mouth 4 (four) times daily as needed for cough. 01/08/23   Vanessa Kick, MD  traMADol (  ULTRAM) 50 MG tablet Take 1 tablet (50 mg total) by mouth every 12 (twelve) hours as needed. 09/01/22   LampteyBritta Mccreedy, MD  Azilsartan-Chlorthalidone 40-12.5 MG TABS Take 1 tablet by mouth daily. Patient not taking: Reported on 04/09/2019 11/02/18 01/23/20  Tysinger, Kermit Balo, PA-C  glyBURIDE-metformin (GLUCOVANCE) 2.5-500 MG tablet Take 1 tablet by mouth 2 (two) times daily with a meal. 04/11/19 03/29/20  Tysinger, Kermit Balo, PA-C      Allergies    Penicillins    Review of Systems   Review of Systems  Constitutional:  Negative for chills and fever.  HENT:   Positive for congestion and rhinorrhea.   Respiratory:  Positive for cough. Negative for shortness of breath.   Cardiovascular:  Negative for chest pain.  Gastrointestinal:  Negative for abdominal pain.  All other systems reviewed and are negative.   Physical Exam Updated Vital Signs BP (!) 169/77 (BP Location: Right Arm)   Pulse 88   Temp 98.3 F (36.8 C) (Oral)   Resp 16   SpO2 100%  Physical Exam Vitals and nursing note reviewed.  Constitutional:      General: Rodney Rose is not in acute distress.    Appearance: Rodney Rose is not ill-appearing.  HENT:     Head: Normocephalic.  Eyes:     Pupils: Pupils are equal, round, and reactive to light.  Cardiovascular:     Rate and Rhythm: Normal rate and regular rhythm.     Pulses: Normal pulses.     Heart sounds: Normal heart sounds. No murmur heard.    No friction rub. No gallop.  Pulmonary:     Effort: Pulmonary effort is normal.     Breath sounds: Normal breath sounds.     Comments: Respirations equal and unlabored, patient able to speak in full sentences, lungs clear to auscultation bilaterally Abdominal:     General: Abdomen is flat. There is no distension.     Palpations: Abdomen is soft.     Tenderness: There is no abdominal tenderness. There is no guarding or rebound.  Musculoskeletal:        General: Normal range of motion.     Cervical back: Neck supple.  Skin:    General: Skin is warm and dry.  Neurological:     General: No focal deficit present.     Mental Status: Rodney Rose is alert.  Psychiatric:        Mood and Affect: Mood normal.        Behavior: Behavior normal.     ED Results / Procedures / Treatments   Labs (all labs ordered are listed, but only abnormal results are displayed) Labs Reviewed  RESP PANEL BY RT-PCR (RSV, FLU A&B, COVID)  RVPGX2    EKG None  Radiology No results found.  Procedures Procedures    Medications Ordered in ED Medications - No data to display  ED Course/ Medical Decision Making/ A&P                              Medical Decision Making Amount and/or Complexity of Data Reviewed External Data Reviewed: notes.    Details: UC note   47 year old male presents to the ED due to rhinorrhea and cough x 4 days.  Seen at urgent care on 1/13 for similar symptoms.  Denies chest pain and shortness of breath.  No fever.  Upon arrival, patient afebrile, not tachycardic or hypoxic.  Patient in no acute distress.  Reassuring physical exam.  Lungs clear to auscultation bilaterally.  Low suspicion for pneumonia.  Abdomen soft, nondistended, nontender.  No meningismus to suggest meningitis.  Suspect viral etiology.  Respiratory panel ordered.  Patient discharged with [facility].  Discussed with patient that a postviral cough can last for a few weeks.  Advised patient to follow-up with PCP if symptoms not improve over the next few days. Strict ED precautions discussed with patient. Patient states understanding and agrees to plan. Patient discharged home in no acute distress and stable vitals  Has PCP Hx DM, HTN, previous CVA       Final Clinical Impression(s) / ED Diagnoses Final diagnoses:  Viral URI with cough    Rx / DC Orders ED Discharge Orders          Ordered    benzonatate (TESSALON) 100 MG capsule  Every 8 hours        01/18/23 1031              Suzy Bouchard, PA-C 01/18/23 1032    Lorelle Gibbs, DO 01/18/23 1351

## 2023-08-13 LAB — COLOGUARD

## 2023-09-07 LAB — COLOGUARD: COLOGUARD: NEGATIVE

## 2024-07-01 ENCOUNTER — Ambulatory Visit (HOSPITAL_COMMUNITY)
Admission: EM | Admit: 2024-07-01 | Discharge: 2024-07-01 | Disposition: A | Discharge status: Home / Self Care | Payer: MEDICAID | Attending: Nurse Practitioner | Admitting: Nurse Practitioner

## 2024-07-01 ENCOUNTER — Encounter (HOSPITAL_COMMUNITY): Payer: Self-pay

## 2024-07-01 DIAGNOSIS — I10 Essential (primary) hypertension: Secondary | ICD-10-CM

## 2024-07-01 DIAGNOSIS — U071 COVID-19: Secondary | ICD-10-CM | POA: Diagnosis not present

## 2024-07-01 LAB — POC COVID19/FLU A&B COMBO
Covid Antigen, POC: POSITIVE — AB
Influenza A Antigen, POC: NEGATIVE
Influenza B Antigen, POC: NEGATIVE

## 2024-07-01 MED ORDER — PAXLOVID (150/100) 10 X 150 MG & 10 X 100MG PO TBPK: 2.0000 | ORAL_TABLET | Freq: Two times a day (BID) | ORAL | 0 refills | Status: DC

## 2024-07-01 MED ORDER — AMLODIPINE BESYLATE 10 MG PO TABS: 5.0000 mg | ORAL_TABLET | Freq: Every day | ORAL | 0 refills | Status: AC

## 2024-07-01 MED ORDER — PSEUDOEPH-BROMPHEN-DM 30-2-10 MG/5ML PO SYRP: 10.0000 mL | ORAL_SOLUTION | Freq: Four times a day (QID) | ORAL | 0 refills | Status: AC | PRN

## 2024-07-01 MED ORDER — PAXLOVID (150/100) 10 X 150 MG & 10 X 100MG PO TBPK: 2.0000 | ORAL_TABLET | Freq: Two times a day (BID) | ORAL | 0 refills | Status: AC

## 2024-07-01 MED ORDER — ACETAMINOPHEN 325 MG PO TABS
650.0000 mg | ORAL_TABLET | Freq: Once | ORAL | Status: AC
Start: 2024-07-01 — End: 2024-07-01
  Administered 2024-07-01: 650 mg via ORAL

## 2024-07-01 MED ORDER — ACETAMINOPHEN 325 MG PO TABS
ORAL_TABLET | ORAL | Status: AC
  Filled 2024-07-01: qty 2

## 2024-07-01 NOTE — Discharge Instructions (Addendum)
 You were seen today for symptoms including cough, nasal congestion, runny nose, sneezing, and fever. You tested positive for COVID-19 and negative for influenza. Given your age and medical history, you were prescribed Paxlovid  to help reduce the risk of severe illness. Because of your reduced kidney function, the dose of the Paxlovid  has reduced. You should hold your atorvastatin  while taking Paxlovid  and may resume it after the last dose is taken. You should also reduce your amlodipine  dose by 50% (1/2 tablet) while on Paxlovid  and continue the lower dose for three days after completing the treatment, then resume your usual dose of 10 mg daily.  To manage your symptoms at home, continue supportive care with rest, fluids, and medications such as Tylenol  or ibuprofen  for fever or body aches.   Although isolation is no longer required by the CDC, you should stay home until you have been fever-free for at least 24 hours without using fever-reducing medications. Avoid close contact with others, especially those who are elderly or immunocompromised.  Follow up with your primary care provider if your symptoms do not improve after 5 to 7 days or if you have questions about resuming your regular medications.   Go to the emergency department if you experience difficulty breathing, chest pain, confusion, persistent high fever, or signs of dehydration such as dizziness or decreased urination.

## 2024-07-01 NOTE — ED Provider Notes (Signed)
 [facility]    CSN: [id number] Arrival date & time: 07/01/24  1550      History   Chief Complaint Chief Complaint  Patient presents with   Cough    HPI Rodney Rose is a 48 y.o. male.   Discussed the use of AI scribe software for clinical note transcription with the patient, who gave verbal consent to proceed.   The patient presents with a ay history of cough and nasal congestion. The patient reports feeling sick for the past three days with symptoms including cough, nasal congestion, and chills. He did not realize he had a fever at home but was found to have a temperature of 103F upon arrival at the clinic. The patient also reports having a runny nose and sneezing. The patient denies chills, body aches, sore throat, or sweating. He denies shortness of breath, wheezing, sore throat, or headache. The patient has been taking Nyquil and Tylenol  for symptom relief, but reports that these medications have not been particularly effective. He denies any known COVID-19 exposure. His sister has been sick recently with similar symptoms. The patient is allergic to penicillins and has a medical history significant for hypertension, hypercholesterolemia, and diabetes.  The following portions of the patient's history were reviewed and updated as appropriate: allergies, current medications, past family history, past medical history, past social history, past surgical history, and problem list.     Past Medical History:  Diagnosis Date   Bipolar disorder (HCC)    Dr. Laquetta with [facility]   Chronic back pain    Hypertension 2017   Schizophrenia [facility])    Stroke (HCC) 11/2020    Patient Active Problem List   Diagnosis Date Noted   CKD (chronic kidney disease), stage III with proteinuria (HCC) 12/11/2020   Swelling of left hand 12/11/2020   Type 2 diabetes mellitus with diabetic nephropathy, without long-term current use of insulin  (HCC)    Acute on chronic anemia     CVA (cerebral vascular accident) (HCC) 12/02/2020   Essential hypertension, benign 11/02/2018   Tobacco use disorder 11/02/2018   Bipolar disorder (HCC) 11/02/2018    Past Surgical History:  Procedure Laterality Date   BUBBLE STUDY  12/05/2020   Procedure: BUBBLE STUDY;  Surgeon: Delford Maude BROCKS, MD;  Location: [facility] ENDOSCOPY;  Service: Cardiovascular;;   NO PAST SURGERIES  10/2018   TEE WITHOUT CARDIOVERSION N/A 12/05/2020   Procedure: TRANSESOPHAGEAL ECHOCARDIOGRAM (TEE);  Surgeon: Delford Maude BROCKS, MD;  Location: [facility] ENDOSCOPY;  Service: Cardiovascular;  Laterality: N/A;       Home Medications    Prior to Admission medications   Medication Sig Start Date End Date Taking? Authorizing Provider  brompheniramine-pseudoephedrine-DM 30-2-10 MG/5ML syrup Take 10 mLs by mouth every 6 (six) hours as needed (cough and congestion). 07/01/24  Yes Bertha Earwood, Lucie, FNP  losartan -hydrochlorothiazide  (HYZAAR) 100-25 MG tablet TAKE 1 TABLET BY MOUTH EVERY DAY 01/12/22   Lou Claretta HERO, MD  nirmatrelvir/ritonavir, renal dosing, (PAXLOVID , 150/100,) 10 x 150 MG & 10 x 100MG  TBPK Take 2 tablets by mouth 2 (two) times daily for 5 days. Dosage for moderate renal impairment (eGFR >/= 30 to <60 mL/min): 150 mg nirmatrelvir (one 150 mg tablet) with 100 mg ritonavir (one 100 mg tablet), with both tablets taken together twice daily for 5 days. Not recommended if eGFR < 30 mL/min. PAXLOVID  is not recommend in patients with severe hepatic impairment (Child-Pugh Class C).  GFR 48 back on April 05, 2024 07/01/24 07/06/24 Yes Orra Nolde,  Lucie, FNP  amLODipine  (NORVASC ) 10 MG tablet Take 0.5 tablets (5 mg total) by mouth daily. Take 0.5 tablet (5 mg total) by mouth daily while on the paxlovid  and 3 days beyond completion. May resume normal dosage of 1 tablet (10 mg total) daily 3 days after completing the paxlovid  07/01/24   Iola Lucie, FNP  aspirin  EC 325 MG tablet Take 1 tablet (325 mg total) by mouth daily.  01/06/22   Whitfield Raisin, NP  atorvastatin  (LIPITOR ) 80 MG tablet TAKE 1 TABLET BY MOUTH EVERY DAY 11/24/21   Lou Claretta HERO, MD  carvedilol  (COREG ) 12.5 MG tablet Take 1 tablet (12.5 mg total) by mouth 2 (two) times daily with a meal. 11/24/21   Amponsah, Claretta HERO, MD  dapagliflozin  propanediol (FARXIGA ) 5 MG TABS tablet Take 1 tablet (5 mg total) by mouth daily before breakfast. 12/14/21   Lou Claretta HERO, MD  metFORMIN  (GLUCOPHAGE ) 1000 MG tablet TAKE 1 TABLET BY MOUTH EVERY DAY WITH BREAKFAST 03/03/22   Lou Claretta HERO, MD  Azilsartan-Chlorthalidone  40-12.5 MG TABS Take 1 tablet by mouth daily. Patient not taking: Reported on 04/09/2019 11/02/18 01/23/20  Tysinger, Alm RAMAN, PA-C  glyBURIDE -metformin  (GLUCOVANCE ) 2.5-500 MG tablet Take 1 tablet by mouth 2 (two) times daily with a meal. 04/11/19 03/29/20  Tysinger, Alm RAMAN, PA-C    Family History Family History  Problem Relation Age of Onset   Hypertension Mother    COPD Father    Asthma Sister    Heart disease Neg Hx    Stroke Neg Hx    Diabetes Neg Hx     Social History Social History   Tobacco Use   Smoking status: Every Day    Current packs/day: 0.50    Average packs/day: 0.5 packs/day for 6.0 years (3.0 ttl pk-yrs)    Types: Cigarettes   Smokeless tobacco: Never  Vaping Use   Vaping status: Never Used  Substance Use Topics   Alcohol use: No   Drug use: No     Allergies   Penicillins   Review of Systems Review of Systems  Constitutional:  Negative for chills, diaphoresis and fever.  HENT:  Positive for congestion, rhinorrhea and sneezing. Negative for sore throat.   Respiratory:  Positive for cough. Negative for shortness of breath and wheezing.   Musculoskeletal:  Negative for myalgias.  Neurological:  Positive for light-headedness. Negative for dizziness and headaches.  All other systems reviewed and are negative.    Physical Exam Triage Vital Signs ED Triage Vitals  Encounter Vitals Group     BP  07/01/24 1619 136/77     Girls Systolic BP Percentile --      Girls Diastolic BP Percentile --      Boys Systolic BP Percentile --      Boys Diastolic BP Percentile --      Pulse Rate 07/01/24 1619 99     Resp 07/01/24 1619 16     Temp 07/01/24 1619 (!) 103 F (39.4 C)     Temp Source 07/01/24 1619 Oral     SpO2 07/01/24 1619 95 %     Weight --      Height --      Head Circumference --      Peak Flow --      Pain Score 07/01/24 1621 0     Pain Loc --      Pain Education --      Exclude from Growth Chart --    No data found.  Updated Vital Signs BP 136/77 (BP Location: Right Arm)   Pulse 99   Temp (!) 103 F (39.4 C) (Oral)   Resp 16   SpO2 95%   Visual Acuity Right Eye Distance:   Left Eye Distance:   Bilateral Distance:    Right Eye Near:   Left Eye Near:    Bilateral Near:     Physical Exam Vitals reviewed.  Constitutional:      General: He is awake. He is not in acute distress.    Appearance: Normal appearance. He is well-developed. He is ill-appearing. He is not toxic-appearing or diaphoretic.  HENT:     Head: Normocephalic.     Right Ear: Tympanic membrane, ear canal and external ear normal. No drainage, swelling or tenderness. No middle ear effusion. Tympanic membrane is not erythematous.     Left Ear: Tympanic membrane, ear canal and external ear normal. No drainage, swelling or tenderness.  No middle ear effusion. Tympanic membrane is not erythematous.     Nose: Congestion present. No rhinorrhea.     Mouth/Throat:     Lips: Pink.     Mouth: Mucous membranes are moist.     Pharynx: Oropharynx is clear. Uvula midline. No pharyngeal swelling, oropharyngeal exudate, posterior oropharyngeal erythema or uvula swelling.     Tonsils: No tonsillar exudate or tonsillar abscesses.  Eyes:     General: Vision grossly intact.     Conjunctiva/sclera: Conjunctivae normal.  Cardiovascular:     Rate and Rhythm: Normal rate.     Heart sounds: Normal heart sounds.   Pulmonary:     Effort: Pulmonary effort is normal. No tachypnea or respiratory distress.     Breath sounds: Normal breath sounds and air entry.  Musculoskeletal:        General: Normal range of motion.     Cervical back: Normal range of motion and neck supple.  Lymphadenopathy:     Cervical: No cervical adenopathy.  Skin:    General: Skin is warm and dry.  Neurological:     General: No focal deficit present.     Mental Status: He is alert and oriented to person, place, and time.  Psychiatric:        Behavior: Behavior is cooperative.      UC Treatments / Results  Labs (all labs ordered are listed, but only abnormal results are displayed) Labs Reviewed  POC COVID19/FLU A&B COMBO - Abnormal; Notable for the following components:      Result Value   Covid Antigen, POC Positive (*)    All other components within normal limits    EKG   Radiology No results found.  Procedures Procedures (including critical care time)  Medications Ordered in UC Medications  acetaminophen  (TYLENOL ) tablet 650 mg (650 mg Oral Given 07/01/24 1629)    Initial Impression / Assessment and Plan / UC Course  I have reviewed the triage vital signs and the nursing notes.  Pertinent labs & imaging results that were available during my care of the patient were reviewed by me and considered in my medical decision making (see chart for details).     Patient presents with a 3-day history of cough, nasal congestion, runny nose, sneezing, and an unrecognized fever of 103F at home. No shortness of breath or wheezing reported. Symptoms were minimally responsive to Nyquil and Tylenol . Influenza and COVID-19 were considered in the differential. Patient tested positive for COVID-19 and negative for influenza. Given his age and medical history, Paxlovid  therapy is indicated.  His creatinine was 1.76 with a GFR of 48 at a physical on 04/05/24, consistent with his baseline (1.61 in July 2024; normal in November  2023). Paxlovid  will be renally dosed accordingly. He was instructed to hold atorvastatin  during treatment and may resume it upon completion of the medication. Amlodipine  should be reduced by 50% while on Paxlovid  and continued at that reduced dose for three days following completion, then resumed at the usual 10 mg daily dose. Supportive care, rest, and hydration were encouraged. Although CDC guidelines no longer require isolation, the patient should remain at home until he has been fever-free for 24 hours without the use of fever-reducing medications. Follow-up with PCP if symptoms worsen or fail to improve within 5-7 days. ED precautions reviewed.  Today's evaluation has revealed no signs of a dangerous process. Discussed diagnosis with patient and/or guardian. Patient and/or guardian aware of their diagnosis, possible red flag symptoms to watch out for and need for close follow up. Patient and/or guardian understands verbal and written discharge instructions. Patient and/or guardian comfortable with plan and disposition.  Patient and/or guardian has a clear mental status at this time, good insight into illness (after discussion and teaching) and has clear judgment to make decisions regarding their care  Documentation was completed with the aid of voice recognition software. Transcription may contain typographical errors. Final Clinical Impressions(s) / UC Diagnoses   Final diagnoses:  COVID-19     Discharge Instructions      You were seen today for symptoms including cough, nasal congestion, runny nose, sneezing, and fever. You tested positive for COVID-19 and negative for influenza. Given your age and medical history, you were prescribed Paxlovid  to help reduce the risk of severe illness. Because of your reduced kidney function, the dose of the Paxlovid  has reduced. You should hold your atorvastatin  while taking Paxlovid  and may resume it after the last dose is taken. You should also reduce your  amlodipine  dose by 50% (1/2 tablet) while on Paxlovid  and continue the lower dose for three days after completing the treatment, then resume your usual dose of 10 mg daily.  To manage your symptoms at home, continue supportive care with rest, fluids, and medications such as Tylenol  or ibuprofen  for fever or body aches.   Although isolation is no longer required by the CDC, you should stay home until you have been fever-free for at least 24 hours without using fever-reducing medications. Avoid close contact with others, especially those who are elderly or immunocompromised.  Follow up with your primary care provider if your symptoms do not improve after 5 to 7 days or if you have questions about resuming your regular medications.   Go to the emergency department if you experience difficulty breathing, chest pain, confusion, persistent high fever, or signs of dehydration such as dizziness or decreased urination.      ED Prescriptions     Medication Sig Dispense Auth. Provider   nirmatrelvir/ritonavir, renal dosing, (PAXLOVID , 150/100,) 10 x 150 MG & 10 x 100MG  TBPK Take 2 tablets by mouth 2 (two) times daily for 5 days. Dosage for moderate renal impairment (eGFR >/= 30 to <60 mL/min): 150 mg nirmatrelvir (one 150 mg tablet) with 100 mg ritonavir (one 100 mg tablet), with both tablets taken together twice daily for 5 days. Not recommended if eGFR < 30 mL/min. PAXLOVID  is not recommend in patients with severe hepatic impairment (Child-Pugh Class C).  GFR 48 back on April 05, 2024 20 tablet Iola Lukes, [place]  amLODipine  (NORVASC ) 10 MG tablet Take 0.5 tablets (5 mg total) by mouth daily. Take 0.5 tablet (5 mg total) by mouth daily while on the paxlovid  and 3 days beyond completion. May resume normal dosage of 1 tablet (10 mg total) daily 3 days after completing the paxlovid  2 tablet Iola Lukes, FNP   brompheniramine-pseudoephedrine-DM 30-2-10 MG/5ML syrup Take 10 mLs by mouth every 6  (six) hours as needed (cough and congestion). 120 mL Iola Lukes, FNP      PDMP not reviewed this encounter.   Iola Lukes, [place] 07/01/24 1729

## 2024-07-01 NOTE — ED Triage Notes (Signed)
 Patient here today with c/o cough, nasal congestion, and chills X 3 days. Patient has taken Nyquil and Tylenol  with no relief. Patient's sister has also had a cough and fever.

## 2024-08-09 NOTE — Progress Notes (Signed)
 [address] - AMBULATORY [facility]  - FAMILY MEDICINE [facility] [address] [place] [address]    Date of Service: 08/09/2024 Patient Name: Rodney Rose Patient DOB: 10/10/1976    Assessment and Plan:  Rodney Rose was seen today for annual exam.  Diagnoses and all orders for this visit:  Type 2 diabetes mellitus with diabetic nephropathy, without long-term current use of insulin     (CMD) Continue monitor home sugars and call with any concerns.  No change medications today.  Routine labs for monitoring.  Refer to podiatry to help with managing his toenails. -     CBC with Differential -     Comprehensive Metabolic Panel -     Lipid Panel -     Hemoglobin A1C With Estimated Average Glucose -     Albumin, Random Urine -     Ambulatory referral to Podiatry; Future  Essential (primary) hypertension Blood pressure is good.  No issues with medications.  No issues with low blood pressures recently.  We will continue to monitor and call if has any problems. -     TSH  Hemiplegia and hemiparesis following cerebral infarction affecting left non-dominant side (HCC) Doing well.  May be getting a prosthetic.  Overall has come along way.  Anticardiolipin antibody syndrome (HCC)  Stage 3a chronic kidney disease (CMD) Stable continue monitor labs.  Chronic foot pain, right Discussed taking ibuprofen  periodically given that he is on aspirin  every day.  Can take as needed notices any side effects he should stop the medication. -     ibuprofen  (MOTRIN ) 600 mg tablet; Take 1 tablet (600 mg total) by mouth every 12 (twelve) hours as needed for mild pain (1-3).  Schizophrenia, unspecified type    (CMD) Continues to see psych and has been doing pretty good.     He verbalizes understanding and agreement of his current diagnoses, medications and therapies. All questions answered.   Medication side effects discussed with patient. Advised  patient to call clinic or return for visit if these symptoms occur. The patient does not  have any barriers to taking medications as prescribed. Goals of care discussed with patient including medication adherence and adequate follow up Relevant barriers identified and addressed: none.   Results for orders placed or performed in visit on 04/05/24  Comprehensive Metabolic Panel   Collection Time: 04/05/24 10:09 AM  Result Value Ref Range   Sodium 141 136 - 145 mmol/L   Potassium 4.1 3.5 - 5.1 mmol/L   Chloride 104 98 - 107 mmol/L   CO2 29 21 - 31 mmol/L   Anion Gap 8 6 - 14 mmol/L   Glucose, Random 135 (H) 70 - 99 mg/dL   Blood Urea Nitrogen (BUN) 25 7 - 25 mg/dL   Creatinine 8.23 (H) 9.29 - 1.30 mg/dL   eGFR 48 (L) >40 fO/fpw/8.26f7   Albumin 4.6 3.5 - 5.7 g/dL   Total Protein 7.2 6.4 - 8.9 g/dL   Bilirubin, Total 0.4 0.3 - 1.0 mg/dL   Alkaline Phosphatase (ALP) 89 34 - 104 U/L   Aspartate Aminotransferase (AST) 22 13 - 39 U/L   Alanine Aminotransferase (ALT) 30 7 - 52 U/L   Calcium  10.2 8.6 - 10.3 mg/dL   BUN/Creatinine Ratio 14.2 10.0 - 20.0  Hemoglobin A1C With Estimated Average Glucose   Collection Time: 04/05/24 10:09 AM  Result Value Ref Range   Hemoglobin A1c 6.5 (H) <5.7 %   Estimated  Average Glucose 140 mg/dL  Lipid Panel   Collection Time: 04/05/24 10:09 AM  Result Value Ref Range   Cholesterol, Total, Lipid Panel 133 <200 mg/dL   Triglycerides, Lipid Panel 159 (H) <150 mg/dL   HDL Cholesterol - Lipid Panel 30 (L) >=60 mg/dL   LDL Cholesterol, Calculated 77 <100 mg/dL   Non-HDL Cholesterol 896 mg/dL   Return in about 6 months (around 02/09/2025) for Chronic visit. Electronically signed by: Elsie Fairy Favorite, PA-C 08/09/2024 12:46 PM   HPI:   Rodney Rose is a 48 y.o. male presents with Annual Exam (Fasting for labs) Patient presents to the office today for routine visit of their multiple medical problems.  Sitting in the office today in no acute distress  talking in complete sentences.  Feels pretty good today and overall.  Blood pressure is good in the office.  Denies chest pain, SOB, HA, palpitations, or vision changes.  No issues with his medications.  Sugars been running good and his blood pressure is good at the house.  Mentions that he making a prosthetic for his left upper extremity to help with some of his elbow and hand motion.  He does continue to see psych and has his medicines stable and he feels good on his current dosage.    ROS:  Constitutional symptoms: negative Eyes:  negative Ear, nose, throat:  negative Cardiovascular:  negative Respiratory:  negative Gastrointestinal:  negative Genitourinary:  negative Skin:  negative Neurological:  negative Musculoskeletal:  negative Psychiatric:  negative Endocrine:  negative Hematological:  negative Allergic:  negative  Objective:   Vitals:   08/09/24 1017 08/09/24 1026  BP: 141/70 127/74  Pulse: 79 79  Resp: 18   Temp: 97.7 F (36.5 C)   TempSrc: Temporal   SpO2: 98% 100%  Weight: 102 kg (224 lb 12.8 oz)   Height: 1.854 m (6' 1)     Constitutional: Well-developed and well-nourished. Sitting comfortably conversing normally. Pleasant.  Cardiovascular: +S1S2, regular rate and rhythm, no murmurs, gallops or rubs appreciated.  Respiratory: Normal effort, clear to auscultation bilaterally. No wheezes, rales or rhonchi noted.  Psychiatric: Behavior is Cooperative and Polite. Mood euthymyic. Affect is appropriate.  Allergies Penicillins  Current Medications[1] Problem List[2] Surgical History[3] Family History[4] Social History[5] Tobacco Use History[6]   I have reviewed and (if needed) updated the patient's problem list, medications, allergies, past medical and surgical history, social and family history. Health Maintenance Status       Date Due Completion Dates   Diabetes:  Quantitative uACR for Kidney Evaluation Never done ---   Influenza Vaccine (1) 08/10/2024  (Originally 07/27/2024) 10/28/2022, 09/08/2021   DTaP/Tdap/Td Vaccines (2 - Td or Tdap) 03/19/2025 03/20/2015   Diabetes:  eGFR for Kidney Evaluation 04/05/2025 04/05/2024, 07/21/2023   Diabetes: Hemoglobin A1C 04/05/2025 04/05/2024, 04/05/2024   Diabetes: Retinopathy Screening Combo 06/15/2025 06/16/2023, 06/16/2023   Comprehensive Annual Visit 08/09/2025 08/09/2024, 07/21/2023   Diabetes: Foot Exam 08/09/2025 08/09/2024   Depression Screening 08/09/2025 08/09/2024   Colorectal Cancer Screening 08/31/2026 09/01/2023   Adult RSV (60+ Years or Pregnancy) (1 - 1-dose 75+ series) 12/04/2052 ---         LILLETTE Elsie Favorite, PA-C, have reviewed the scribe's documentation, personally examined the patient, added my documentation and attest it is accurate and complete.        [1] Current Outpatient Medications  Medication Sig Dispense Refill  . amLODIPine  (NORVASC ) 10 mg tablet Take 1 tablet (10 mg total) by mouth daily. 90 tablet 1  .  aspirin  81 mg EC tablet     . atorvastatin  (LIPITOR ) 80 mg tablet TAKE 1 TABLET BY MOUTH EVERY DAY AT NIGHT 90 tablet 1  . ergocalciferol (VITAMIN D2) 1,250 mcg (50,000 unit) capsule TAKE 1 CAPSULE BY MOUTH ONCE A WEEK FOR 30 DAYS, THEN 1 CAPSULE TWICE A WEEK FOR 30 DAYS 12 capsule 1  . haloperidoL  (HALDOL ) 5 mg tablet Take 5 mg by mouth nightly.    . losartan -hydroCHLOROthiazide  (HYZAAR) 100-25 mg per tablet Take 1 tablet by mouth daily. 90 tablet 1  . metFORMIN  (GLUCOPHAGE -XR) 500 mg 24 hr tablet TAKE 1 TABLET BY MOUTH TWICE A DAY 180 tablet 1  . promethazine -dextromethorphan (PHENERGAN  DM) 6.25-15 mg/5 mL syrp syrup Take 10 mL by mouth nightly as needed (for nighttime cough). 118 mL 0  . Farxiga  10 mg tab tablet Take 10 mg by mouth daily.    . ibuprofen  (MOTRIN ) 600 mg tablet Take 1 tablet (600 mg total) by mouth every 12 (twelve) hours as needed for mild pain (1-3). 30 tablet 0   No current facility-administered medications for this visit.  [2] Patient Active Problem  List Diagnosis  . Chronic iron deficiency anemia  . CKD (chronic kidney disease), stage III (CMD)  . Essential (primary) hypertension  . Tobacco use disorder  . Type 2 diabetes mellitus with diabetic nephropathy, without long-term current use of insulin     (CMD)  . Anticardiolipin antibody syndrome (HCC)  . Hemiplegia and hemiparesis following cerebral infarction affecting left non-dominant side (HCC)  . Vitamin deficiency  [3] History reviewed. No pertinent surgical history. [4] Family History Problem Relation Name Age of Onset  . Colon cancer Mother    . Diabetes Father    . Cancer Neg Hx    . Heart disease Neg Hx    . Hypertension Neg Hx    . Stroke Neg Hx    . Thyroid  disease Neg Hx    [5] Social History Socioeconomic History  . Marital status: Single  Tobacco Use  . Smoking status: Every Day    Current packs/day: 0.75    Average packs/day: 0.8 packs/day for 17.6 years (13.2 ttl pk-yrs)    Types: Cigarettes    Start date: 12/27/2006  . Smokeless tobacco: Never  Vaping Use  . Vaping status: Never Used  Substance and Sexual Activity  . Alcohol use: No  . Drug use: Never  . Sexual activity: Yes    Partners: Female    Comment: one partner  [6] Social History Tobacco Use  Smoking Status Every Day  . Current packs/day: 0.75  . Average packs/day: 0.8 packs/day for 17.6 years (13.2 ttl pk-yrs)  . Types: Cigarettes  . Start date: 12/27/2006  Smokeless Tobacco Never

## 2024-10-11 ENCOUNTER — Ambulatory Visit

## 2024-10-11 DIAGNOSIS — Z23 Encounter for immunization: Secondary | ICD-10-CM

## 2025-01-26 ENCOUNTER — Emergency Department (HOSPITAL_COMMUNITY)
Admission: EM | Admit: 2025-01-26 | Discharge: 2025-01-26 | Disposition: A | Discharge status: Home / Self Care | Attending: Emergency Medicine | Admitting: Emergency Medicine

## 2025-01-26 DIAGNOSIS — Z7982 Long term (current) use of aspirin: Secondary | ICD-10-CM | POA: Diagnosis not present

## 2025-01-26 DIAGNOSIS — R0981 Nasal congestion: Secondary | ICD-10-CM | POA: Insufficient documentation

## 2025-01-26 DIAGNOSIS — Z5900 Homelessness unspecified: Secondary | ICD-10-CM | POA: Insufficient documentation

## 2025-01-26 DIAGNOSIS — N189 Chronic kidney disease, unspecified: Secondary | ICD-10-CM | POA: Insufficient documentation

## 2025-01-26 NOTE — ED Triage Notes (Signed)
 Pt reports feeling congested for several weeks. Denies new symptoms. Pt states that he is currently homeless and would like to speak with social work. NAD noted.

## 2025-01-26 NOTE — ED Provider Notes (Signed)
 " [place] [facility] AT [facility] Provider Note   CSN: [id number] Arrival date & time: 01/26/25  9074     Patient presents with: Nasal Congestion and Homeless   Rodney Rose is a 49 y.o. male.   Pt complains of congestion on an off for several weeks.  Pt is homeless an is requesting social work.  Pt's sister provides history that pt has had a cva, chronic kidney disease and bipolar disease.  Sister is interested in finding a group home for her brother. Pt stayed in shelter last pm.   The history is provided by the patient. No language interpreter was used.       Prior to Admission medications  Medication Sig Start Date End Date Taking? Authorizing Provider  losartan -hydrochlorothiazide  (HYZAAR) 100-25 MG tablet TAKE 1 TABLET BY MOUTH EVERY DAY 01/12/22   Lou Claretta HERO, MD  amLODipine  (NORVASC ) 10 MG tablet Take 0.5 tablets (5 mg total) by mouth daily. Take 0.5 tablet (5 mg total) by mouth daily while on the paxlovid  and 3 days beyond completion. May resume normal dosage of 1 tablet (10 mg total) daily 3 days after completing the paxlovid  07/01/24   Iola Lukes, FNP  aspirin  EC 325 MG tablet Take 1 tablet (325 mg total) by mouth daily. 01/06/22   Whitfield Raisin, NP  atorvastatin  (LIPITOR ) 80 MG tablet TAKE 1 TABLET BY MOUTH EVERY DAY 11/24/21   Amponsah, Prosper M, MD  brompheniramine-pseudoephedrine-DM 30-2-10 MG/5ML syrup Take 10 mLs by mouth every 6 (six) hours as needed (cough and congestion). 07/01/24   Murrill, Samantha, FNP  carvedilol  (COREG ) 12.5 MG tablet Take 1 tablet (12.5 mg total) by mouth 2 (two) times daily with a meal. 11/24/21   Amponsah, Claretta HERO, MD  dapagliflozin  propanediol (FARXIGA ) 5 MG TABS tablet Take 1 tablet (5 mg total) by mouth daily before breakfast. 12/14/21   Lou Claretta HERO, MD  metFORMIN  (GLUCOPHAGE ) 1000 MG tablet TAKE 1 TABLET BY MOUTH EVERY DAY WITH BREAKFAST 03/03/22   Lou Claretta HERO, MD   Azilsartan-Chlorthalidone  40-12.5 MG TABS Take 1 tablet by mouth daily. Patient not taking: Reported on 04/09/2019 11/02/18 01/23/20  Tysinger, Alm RAMAN, PA-C  glyBURIDE -metformin  (GLUCOVANCE ) 2.5-500 MG tablet Take 1 tablet by mouth 2 (two) times daily with a meal. 04/11/19 03/29/20  Tysinger, Alm RAMAN, PA-C    Allergies: Penicillins    Review of Systems  All other systems reviewed and are negative.   Updated Vital Signs BP 133/85 (BP Location: Right Arm)   Pulse 77   Temp 98.2 F (36.8 C) (Oral)   Resp 17   SpO2 100%   Physical Exam Vitals and nursing note reviewed.  Constitutional:      Appearance: He is well-developed.  HENT:     Head: Normocephalic.  Cardiovascular:     Rate and Rhythm: Normal rate.  Pulmonary:     Effort: Pulmonary effort is normal.  Abdominal:     General: There is no distension.  Musculoskeletal:        General: Normal range of motion.     Cervical back: Normal range of motion.  Skin:    General: Skin is warm.  Neurological:     General: No focal deficit present.     Mental Status: He is alert and oriented to person, place, and time.     (all labs ordered are listed, but only abnormal results are displayed) Labs Reviewed - No data to display  EKG: None  Radiology: No  results found.   Procedures   Medications Ordered in the ED - No data to display                                  Medical Decision Making       Final diagnoses:  Nasal congestion  Homeless    ED Discharge Orders     None      Social work consult     Flint Sonny POUR, [place] 01/26/25 1052  "

## 2025-01-26 NOTE — Progress Notes (Signed)
 CSW notified pt presented to ED with sister who is interested in placement and mental health services. Pt is currently homeless.   CSW reviewed chart and pt currently has Managed Medicaid- [employer]. CSW provided information for pt/family to contact [employer] to requested Care Coordinator who will lead placement search and authorize if appropriate. CSW also included DSS information as backup resource to seek assistance from placement social worker.   CSW included information for [facility] to address mental health needs. Attached [employer] Medicaid transportation to address transportation barrier to/from Medical appointments.   CSW also included [employer]. PA notified. No further ICM needs.

## 2025-01-26 NOTE — Discharge Instructions (Addendum)
 You have a Managed Medicaid plan - [employer]. If you are interested in placement, please begin the process by contacting [employer] at [phone]  and asking to speak with your care coordinator. [employer] will assist with finding an appropriate group home and authorizing your placement. Your subscriber # is: [id number].  If you are unable to receive assistance, please contact or visit Department of Social Services at [phone] and request to speak with a placement social worker. Resources are included in this discharge packet.   For transportation to and from medical appointments, or to return home after a hospital visit, please call [phone].   Please visit [facility] at [place] for shelter. The [facility] should be open until Monday at 7AM.
